# Patient Record
Sex: Male | Born: 1960
Health system: Southern US, Community
[De-identification: ages and names within clinical notes are randomized; demographics above are authoritative.]

## PROBLEM LIST (undated history)

## (undated) DIAGNOSIS — I10 Essential (primary) hypertension: Secondary | ICD-10-CM

## (undated) DIAGNOSIS — M1990 Unspecified osteoarthritis, unspecified site: Secondary | ICD-10-CM

## (undated) DIAGNOSIS — H269 Unspecified cataract: Secondary | ICD-10-CM

## (undated) DIAGNOSIS — M1460 Charcot's joint, unspecified site: Secondary | ICD-10-CM

## (undated) HISTORY — PX: HERNIA REPAIR: SHX51

## (undated) HISTORY — PX: CATARACT EXTRACTION: SUR2

## (undated) HISTORY — PX: KNEE SURGERY: SHX244

---

## 2013-11-24 ENCOUNTER — Emergency Department (HOSPITAL_COMMUNITY)
Admission: EM | Admit: 2013-11-24 | Discharge: 2013-11-24 | Disposition: A | Discharge status: Home / Self Care | Payer: Self-pay | Attending: Emergency Medicine | Admitting: Emergency Medicine

## 2013-11-24 ENCOUNTER — Encounter (HOSPITAL_COMMUNITY): Payer: Self-pay | Admitting: Emergency Medicine

## 2013-11-24 DIAGNOSIS — R609 Edema, unspecified: Secondary | ICD-10-CM | POA: Insufficient documentation

## 2013-11-24 DIAGNOSIS — M79609 Pain in unspecified limb: Secondary | ICD-10-CM

## 2013-11-24 DIAGNOSIS — M25462 Effusion, left knee: Secondary | ICD-10-CM

## 2013-11-24 DIAGNOSIS — M25569 Pain in unspecified knee: Secondary | ICD-10-CM | POA: Insufficient documentation

## 2013-11-24 DIAGNOSIS — M25562 Pain in left knee: Secondary | ICD-10-CM

## 2013-11-24 DIAGNOSIS — M25469 Effusion, unspecified knee: Secondary | ICD-10-CM | POA: Insufficient documentation

## 2013-11-24 MED ORDER — PREDNISONE 20 MG PO TABS: 40.0000 mg | ORAL_TABLET | Freq: Every day | ORAL | Status: DC

## 2013-11-24 MED ORDER — IBUPROFEN 600 MG PO TABS: 600.0000 mg | ORAL_TABLET | Freq: Four times a day (QID) | ORAL | Status: DC | PRN

## 2013-11-24 NOTE — ED Provider Notes (Signed)
CSN: [id number]     Arrival date & time 11/24/13  1648 History  This chart was scribed for Junius FinnerErin O'Malley, PA-C, non-physician practitioner working with Celene KrasJon R Knapp, MD by Nicholos Johnsenise Iheanachor, ED scribe. This patient was seen in room TR07C/TR07C and the patient's care was started at 6:47 PM.  Chief Complaint  Patient presents with  . Leg Pain    The history is provided by the patient. No language interpreter was used.  HPI Comments: Travis Mayo is a 53 y.o. male who presents to the Emergency Department complaining of left leg pain and stiffness, onset 5 weeks ago; associated swelling onset 2 days ago. Reports limited mobility of that left leg. Says it's a lot of pulling in the calf. States initial sx was a burning sensation in the back of the calf and knee when sx first onset. Right leg also swells intermittently but current complaint is all on the left. States knee pain and discomfort has not been of concern in the past, what is causing him concern today is pain in his shoulder and neck along with left knee pain that have not occurred in the past. Was seen at Urgent Care today for same sx and was told to come to the ER to rule out DVT. No hx of blood clot or injury related to current pain. Had gout several years ago in the big toe. Does not know of any heart problems that would contribute to leg swelling.   History reviewed. No pertinent past medical history. Past Surgical History  Procedure Laterality Date  . Hernia repair    . Knee surgery     History reviewed. No pertinent family history. History  Substance Use Topics  . Smoking status: Never Smoker   . Smokeless tobacco: Not on file  . Alcohol Use: Yes    Review of Systems  Musculoskeletal:       Left knee pain and swelling.  All other systems reviewed and are negative.   Allergies  Review of patient's allergies indicates no known allergies.  Home Medications   Current Outpatient Rx  Name  Route  Sig  Dispense  Refill  .  ibuprofen (ADVIL,MOTRIN) 600 MG tablet   Oral   Take 1 tablet (600 mg total) by mouth every 6 (six) hours as needed.   30 tablet   0   . predniSONE (DELTASONE) 20 MG tablet   Oral   Take 2 tablets (40 mg total) by mouth daily.   10 tablet   0     Triage Vitals: BP 154/69  Pulse 96  Temp(Src) 98.6 F (37 C) (Oral)  Resp 18  Ht 6' (1.829 m)  Wt 207 lb (93.895 kg)  BMI 28.07 kg/m2  SpO2 100%  Physical Exam  Nursing note and vitals reviewed. Constitutional: He is oriented to person, place, and time. He appears well-developed and well-nourished.  HENT:  Head: Normocephalic and atraumatic.  Eyes: EOM are normal.  Neck: Normal range of motion.  Cardiovascular: Normal rate.   Pulmonary/Chest: Effort normal.  Musculoskeletal: Normal range of motion. He exhibits edema.  LEFT KNEE: Mild to moderate edema in left knee. No erythema, warmth or ecchymosis. No joint line tenderness. No crepitus. No calf tenderness.  Neurological: He is alert and oriented to person, place, and time.  Skin: Skin is warm and dry.  Psychiatric: He has a normal mood and affect. His behavior is normal.   ED Course  Procedures (including critical care time) DIAGNOSTIC STUDIES: Oxygen Saturation is  100% on room air, normal by my interpretation.    COORDINATION OF CARE: At 6:52 PM: Discussed treatment plan with patient which includes a left knee sleeve and some medication to reduce inflammation. Patient agrees.   Labs Review Labs Reviewed - No data to display Imaging Review No results found.  EKG Interpretation   None       MDM   Final diagnoses:  Left knee pain  Swelling of left knee joint   Pt presenting with left knee pain and swelling, directed to ED by PCP to r/u DVT. Pt is low risk for DVT. No evidence of cellulitis or septic joint. No hx of injury.   Venous Duplex: no evidence of DVT or Baker's cyst. Will tx with knee sleeve, ibuprofen, and prednisone.  Advised to f/u with [employer] and PCP if not improving. Return precautions provided. Pt verbalized understanding and agreement with tx plan.   I personally performed the services described in this documentation, which was scribed in my presence. The recorded information has been reviewed and is accurate.      Junius Finner, PA-C 11/25/13 0200

## 2013-11-24 NOTE — ED Notes (Signed)
Pt reports for the past 5 weeks he has had pain in the left leg. Reports that he noticed swelling to the leg on Saturday. States he was sent here by PCP for r/o DVT. Pt reports pain and stiffness to the left leg. Denies any Chest pain or SOB.

## 2013-11-24 NOTE — ED Notes (Signed)
Spoke w/[facility]

## 2013-11-24 NOTE — ED Notes (Signed)
Venous duplex completed 

## 2013-11-24 NOTE — Progress Notes (Signed)
Left lower extremity venous duplex completed.  Left:  No evidence of DVT, superficial thrombosis, or Baker's cyst.  Right:  Negative for DVT in the common femoral vein.  

## 2013-11-24 NOTE — Discharge Instructions (Signed)
Arthralgia Arthralgia is joint pain. A joint is a place where two bones meet. Joint pain can happen for many reasons. The joint can be bruised, stiff, infected, or weak from aging. Pain usually goes away after resting and taking medicine for soreness.  HOME CARE  Rest the joint as told by your doctor.  Keep the sore joint raised (elevated) for the first 24 hours.  Put ice on the joint area.  Put ice in a plastic bag.  Place a towel between your skin and the bag.  Leave the ice on for 15-20 minutes, 03-04 times a day.  Wear your splint, casting, elastic bandage, or sling as told by your doctor.  Only take medicine as told by your doctor. Do not take aspirin.  Use crutches as told by your doctor. Do not put weight on the joint until told to by your doctor. GET HELP RIGHT AWAY IF:   You have bruising, puffiness (swelling), or more pain.  Your fingers or toes turn blue or start to lose feeling (numb).  Your medicine does not lessen the pain.  Your pain becomes severe.  You have a temperature by mouth above 102 F (38.9 C), not controlled by medicine.  You cannot move or use the joint. MAKE SURE YOU:   Understand these instructions.  Will watch your condition.  Will get help right away if you are not doing well or get worse. Document Released: 09/06/2009 Document Revised: 12/11/2011 Document Reviewed: 09/06/2009 [facility] Patient Information 2014 [place].  Heat Therapy Heat therapy can help make painful, stiff muscles and joints feel better. Do not use heat on new injuries. Wait at least 48 hours after an injury to use heat. Do not use heat when you have aches or pains right after an activity. If you still have pain 3 hours after stopping the activity, then you may use heat. HOME CARE Wet heat pack  Soak a clean towel in warm water. Squeeze out the extra water.  Put the warm, wet towel in a plastic bag.  Place a thin, dry towel between your skin and the  bag.  Put the heat pack on the area for 5 minutes, and check your skin. Your skin may be pink, but it should not be red.  Leave the heat pack on the area for 15 to 30 minutes.  Repeat this every 2 to 4 hours while awake. Do not use heat while you are sleeping. Warm water bath  Fill a tub with warm water.  Place the affected body part in the tub.  Soak the area for 20 to 40 minutes.  Repeat as needed. Hot water bottle  Fill the water bottle half full with hot water.  Press out the extra air. Close the cap tightly.  Place a dry towel between your skin and the bottle.  Put the bottle on the area for 5 minutes, and check your skin. Your skin may be pink, but it should not be red.  Leave the bottle on the area for 15 to 30 minutes.  Repeat this every 2 to 4 hours while awake. Electric heating pad  Place a dry towel between your skin and the heating pad.  Set the heating pad on low heat.  Put the heating pad on the area for 10 minutes, and check your skin. Your skin may be pink, but it should not be red.  Leave the heating pad on the area for 20 to 40 minutes.  Repeat this every 2  to 4 hours while awake.  Do not lie on the heating pad.  Do not fall asleep while using the heating pad.  Do not use the heating pad near water. GET HELP RIGHT AWAY IF:  You get blisters or red skin.  Your skin is puffy (swollen), or you lose feeling (numbness) in the affected area.  You have any new problems.  Your problems are getting worse.  You have any questions or concerns. If you have any problems, stop using heat therapy until you see your doctor. MAKE SURE YOU:  Understand these instructions.  Will watch your condition.  Will get help right away if you are not doing well or get worse. Document Released: 12/11/2011 Document Reviewed: 12/11/2011 [facility] Patient Information 2014 [place], [place].

## 2013-11-25 NOTE — ED Provider Notes (Signed)
Medical screening examination/treatment/procedure(s) were performed by non-physician practitioner and as supervising physician I was immediately available for consultation/collaboration.    Celene KrasJon R Voula Waln, MD 11/25/13 [id number]

## 2014-04-13 ENCOUNTER — Telehealth (HOSPITAL_COMMUNITY): Payer: Self-pay | Admitting: *Deleted

## 2014-05-18 ENCOUNTER — Telehealth (HOSPITAL_COMMUNITY): Payer: Self-pay | Admitting: *Deleted

## 2014-05-20 ENCOUNTER — Telehealth (HOSPITAL_COMMUNITY): Payer: Self-pay | Admitting: *Deleted

## 2014-06-01 ENCOUNTER — Telehealth (HOSPITAL_COMMUNITY): Payer: Self-pay | Admitting: *Deleted

## 2014-06-04 ENCOUNTER — Other Ambulatory Visit (HOSPITAL_COMMUNITY): Payer: Self-pay | Admitting: Family Medicine

## 2014-06-04 DIAGNOSIS — R5381 Other malaise: Secondary | ICD-10-CM

## 2014-06-04 DIAGNOSIS — R5383 Other fatigue: Principal | ICD-10-CM

## 2014-06-10 ENCOUNTER — Ambulatory Visit (HOSPITAL_COMMUNITY)
Admission: RE | Admit: 2014-06-10 | Discharge: 2014-06-10 | Disposition: A | Discharge status: Home / Self Care | Payer: Self-pay | Source: Ambulatory Visit | Attending: Cardiology | Admitting: Cardiology

## 2014-06-10 DIAGNOSIS — I517 Cardiomegaly: Secondary | ICD-10-CM

## 2014-06-10 DIAGNOSIS — R5381 Other malaise: Secondary | ICD-10-CM | POA: Insufficient documentation

## 2014-06-10 DIAGNOSIS — R609 Edema, unspecified: Secondary | ICD-10-CM | POA: Insufficient documentation

## 2014-06-10 DIAGNOSIS — R5383 Other fatigue: Principal | ICD-10-CM

## 2014-06-10 DIAGNOSIS — R7881 Bacteremia: Secondary | ICD-10-CM | POA: Insufficient documentation

## 2014-06-10 DIAGNOSIS — M79609 Pain in unspecified limb: Secondary | ICD-10-CM | POA: Insufficient documentation

## 2014-06-10 DIAGNOSIS — I080 Rheumatic disorders of both mitral and aortic valves: Secondary | ICD-10-CM | POA: Insufficient documentation

## 2014-06-10 NOTE — Progress Notes (Signed)
2D Echocardiogram Complete.  06/10/2014   Rylyn Ranganathan, RDCS  

## 2018-10-07 ENCOUNTER — Emergency Department (HOSPITAL_COMMUNITY)
Admission: EM | Admit: 2018-10-07 | Discharge: 2018-10-07 | Disposition: A | Discharge status: Home / Self Care | Payer: Self-pay | Attending: Emergency Medicine | Admitting: Emergency Medicine

## 2018-10-07 ENCOUNTER — Emergency Department (EMERGENCY_DEPARTMENT_HOSPITAL): Payer: Self-pay

## 2018-10-07 ENCOUNTER — Emergency Department (HOSPITAL_COMMUNITY): Payer: Self-pay

## 2018-10-07 ENCOUNTER — Encounter (HOSPITAL_COMMUNITY): Payer: Self-pay | Admitting: *Deleted

## 2018-10-07 DIAGNOSIS — L03116 Cellulitis of left lower limb: Secondary | ICD-10-CM | POA: Insufficient documentation

## 2018-10-07 DIAGNOSIS — R600 Localized edema: Secondary | ICD-10-CM

## 2018-10-07 DIAGNOSIS — M79662 Pain in left lower leg: Secondary | ICD-10-CM | POA: Insufficient documentation

## 2018-10-07 LAB — CBC WITH DIFFERENTIAL/PLATELET
Abs Immature Granulocytes: 0.09 10*3/uL — ABNORMAL HIGH (ref 0.00–0.07)
BASOS PCT: 1 %
Basophils Absolute: 0.1 10*3/uL (ref 0.0–0.1)
Eosinophils Absolute: 0 10*3/uL (ref 0.0–0.5)
Eosinophils Relative: 0 %
HEMATOCRIT: 45.6 % (ref 39.0–52.0)
Hemoglobin: 15 g/dL (ref 13.0–17.0)
IMMATURE GRANULOCYTES: 1 %
LYMPHS ABS: 1 10*3/uL (ref 0.7–4.0)
Lymphocytes Relative: 9 %
MCH: 30.1 pg (ref 26.0–34.0)
MCHC: 32.9 g/dL (ref 30.0–36.0)
MCV: 91.6 fL (ref 80.0–100.0)
MONO ABS: 0.8 10*3/uL (ref 0.1–1.0)
Monocytes Relative: 8 %
Neutro Abs: 8.5 10*3/uL — ABNORMAL HIGH (ref 1.7–7.7)
Neutrophils Relative %: 81 %
PLATELETS: 333 10*3/uL (ref 150–400)
RBC: 4.98 MIL/uL (ref 4.22–5.81)
RDW: 12.9 % (ref 11.5–15.5)
WBC: 10.4 10*3/uL (ref 4.0–10.5)
nRBC: 0 % (ref 0.0–0.2)

## 2018-10-07 LAB — COMPREHENSIVE METABOLIC PANEL
ALT: 38 U/L (ref 0–44)
AST: 30 U/L (ref 15–41)
Albumin: 4.1 g/dL (ref 3.5–5.0)
Alkaline Phosphatase: 57 U/L (ref 38–126)
Anion gap: 13 (ref 5–15)
BUN: 9 mg/dL (ref 6–20)
CO2: 23 mmol/L (ref 22–32)
CREATININE: 0.87 mg/dL (ref 0.61–1.24)
Calcium: 9.3 mg/dL (ref 8.9–10.3)
Chloride: 101 mmol/L (ref 98–111)
GFR calc non Af Amer: >60 mL/min (ref >60)
Glucose, Bld: 112 mg/dL — ABNORMAL HIGH (ref 70–99)
Potassium: 3.2 mmol/L — ABNORMAL LOW (ref 3.5–5.1)
SODIUM: 137 mmol/L (ref 135–145)
Total Bilirubin: 1.5 mg/dL — ABNORMAL HIGH (ref 0.3–1.2)
Total Protein: 7.4 g/dL (ref 6.5–8.1)

## 2018-10-07 LAB — I-STAT CG4 LACTIC ACID, ED: Lactic Acid, Venous: 1.47 mmol/L (ref 0.5–1.9)

## 2018-10-07 MED ORDER — LACTATED RINGERS IV BOLUS: 1000.0000 mL | Freq: Once | INTRAVENOUS | Status: DC

## 2018-10-07 MED ORDER — CLINDAMYCIN HCL 150 MG PO CAPS: 450.0000 mg | ORAL_CAPSULE | Freq: Three times a day (TID) | ORAL | 0 refills | Status: AC

## 2018-10-07 MED ORDER — ACETAMINOPHEN 500 MG PO TABS
1000.0000 mg | ORAL_TABLET | Freq: Once | ORAL | Status: DC
  Filled 2018-10-07: qty 2

## 2018-10-07 NOTE — ED Triage Notes (Signed)
Pt in c/o possible cellulitis to his left lower leg, had an injury three months ago and had a rock impale his left, he removed the rock and thought the area was healing fine, in the last few days he developed redness and swelling and increased pain to that area

## 2018-10-07 NOTE — ED Notes (Signed)
Pt verbalized understanding of discharge paperwork, prescriptions and follow-up care 

## 2018-10-07 NOTE — ED Provider Notes (Signed)
[facility] EMERGENCY DEPARTMENT Provider Note   CSN: [id number] Arrival date & time: 10/07/18  1240     History   Chief Complaint Chief Complaint  Patient presents with  . Cellulitis    HPI Travis Mayo is a 58 y.o. male.  HPI   Patient is a 58 year old male with a past medical history of EtOH abuse who presents for evaluation of left lower extremity pain, redness, and swelling.  Patient notes he had a hiking incident approximately 3 months ago during which he slipped and got some gravel embedded in his leg.  He states that the gravel was all removed and the wounds were washed out.  He states he subsequently has been doing fine but over the past week has developed the redness, swelling, and increased pain.  He denies any subsequent or interim traumatic injuries.  Denies any fevers, chills, headache, earache, sore throat, chest pain, cough, shortness of breath abdominal pain, nausea, vomiting, diarrhea, dysuria, pain redness or swelling in any other extremity, or other acute complaints.  Denies taking any analgesics prior today.  Endorses drinking 6-8 beers per day.  History reviewed. No pertinent past medical history.  There are no active problems to display for this patient.   Past Surgical History:  Procedure Laterality Date  . HERNIA REPAIR    . KNEE SURGERY          Home Medications    Prior to Admission medications   Medication Sig Start Date End Date Taking? Authorizing Provider  ibuprofen (ADVIL,MOTRIN) 200 MG tablet Take 800 mg by mouth every 6 (six) hours as needed (for inflammation).    Yes [provider]  clindamycin (CLEOCIN) 150 MG capsule Take 3 capsules (450 mg total) by mouth 3 (three) times daily for 7 days. 10/07/18 10/14/18  Antoine Primas, MD    Family History History reviewed. No pertinent family history.  Social History Social History   Tobacco Use  . Smoking status: Never Smoker  Substance Use Topics  . Alcohol  use: Yes  . Drug use: No     Allergies   Patient has no known allergies.   Review of Systems Review of Systems  Constitutional: Negative for chills and fever.  HENT: Negative for ear pain and sore throat.   Eyes: Negative for pain and visual disturbance.  Respiratory: Negative for cough and shortness of breath.   Cardiovascular: Negative for chest pain and palpitations.  Gastrointestinal: Negative for abdominal pain and vomiting.  Genitourinary: Negative for dysuria and hematuria.  Musculoskeletal: Positive for arthralgias ( L Ankle), joint swelling (L Ankle) and myalgias ( L  ankle). Negative for back pain.  Skin: Negative for color change and rash.  Neurological: Negative for seizures and syncope.  All other systems reviewed and are negative.    Physical Exam Updated Vital Signs BP (!) 171/106   Pulse 95   Temp 98.9 F (37.2 C) (Oral)   Resp 19   SpO2 97%   Physical Exam Vitals signs and nursing note reviewed.  Constitutional:      Appearance: Normal appearance. He is well-developed and normal weight.  HENT:     Head: Normocephalic and atraumatic.     Right Ear: External ear normal.     Left Ear: External ear normal.     Nose: Nose normal.     Mouth/Throat:     Mouth: Mucous membranes are moist.  Eyes:     Conjunctiva/sclera: Conjunctivae normal.  Neck:     Musculoskeletal:  Neck supple.  Cardiovascular:     Rate and Rhythm: Regular rhythm. Tachycardia present.     Heart sounds: No murmur.  Pulmonary:     Effort: Pulmonary effort is normal. No respiratory distress.     Breath sounds: Normal breath sounds.  Abdominal:     Palpations: Abdomen is soft.     Tenderness: There is no abdominal tenderness.  Musculoskeletal:     Left ankle: He exhibits decreased range of motion and swelling.     Comments: There is erythema on the posterior aspect of the left calf as well as increased erythema about the left ankle compared to the right.  Skin:    General: Skin is  warm and dry.     Capillary Refill: Capillary refill takes less than 2 seconds.  Neurological:     General: No focal deficit present.     Mental Status: He is alert.    Of note patient does not have significant pain on passive and active range of motion of the ankle.  ED Treatments / Results  Labs (all labs ordered are listed, but only abnormal results are displayed) Labs Reviewed  COMPREHENSIVE METABOLIC PANEL - Abnormal; Notable for the following components:      Result Value   Potassium 3.2 (*)    Glucose, Bld 112 (*)    Total Bilirubin 1.5 (*)    All other components within normal limits  CBC WITH DIFFERENTIAL/PLATELET - Abnormal; Notable for the following components:   Neutro Abs 8.5 (*)    Abs Immature Granulocytes 0.09 (*)    All other components within normal limits  URINALYSIS, ROUTINE W REFLEX MICROSCOPIC  I-STAT CG4 LACTIC ACID, ED    EKG EKG Interpretation  Date/Time:  Monday October 07 2018 18:19:12 EST Ventricular Rate:  100 PR Interval:    QRS Duration: 86 QT Interval:  346 QTC Calculation: 447 R Axis:   18 Text Interpretation:  Sinus tachycardia Abnormal R-wave progression, early transition no prior available for comparison Confirmed by Tilden Fossaees, Elizabeth [phone]([id number]) on 10/07/2018 6:23:05 PM   Radiology Dg Tibia/fibula Left  Result Date: 10/07/2018 CLINICAL DATA:  Recent soft tissue injury EXAM: LEFT TIBIA AND FIBULA - 2 VIEW COMPARISON:  None. FINDINGS: No acute bony abnormality is identified. Changes of prior trauma in the medial malleolus are seen. Soft tissue swelling is noted about the ankle and extending into the lower leg consistent with the given clinical history. IMPRESSION: Soft tissue swelling without acute bony abnormality. Electronically Signed   By: Alcide CleverMark  Lukens M.D.   On: 10/07/2018 14:36   [place] Lower Extremity Venous (dvt) (only [facility])  Result Date: 10/07/2018  Lower Venous Study Indications: Edema.  Performing Technologist: Toma DeitersVirginia Slaughter  RVS  Examination Guidelines: A complete evaluation includes B-mode imaging, spectral Doppler, color Doppler, and power Doppler as needed of all accessible portions of each vessel. Bilateral testing is considered an integral part of a complete examination. Limited examinations for reoccurring indications may be performed as noted.  Right Venous Findings: +---+---------------+---------+-----------+----------+-------+    CompressibilityPhasicitySpontaneityPropertiesSummary +---+---------------+---------+-----------+----------+-------+ CFVFull           Yes      Yes                          +---+---------------+---------+-----------+----------+-------+  Left Venous Findings: +---------+---------------+---------+-----------+----------+-------+          CompressibilityPhasicitySpontaneityPropertiesSummary +---------+---------------+---------+-----------+----------+-------+ CFV      Full  Yes      Yes                          +---------+---------------+---------+-----------+----------+-------+ SFJ      Full                                                 +---------+---------------+---------+-----------+----------+-------+ FV Prox  Full           Yes      Yes                          +---------+---------------+---------+-----------+----------+-------+ FV Mid   Full                                                 +---------+---------------+---------+-----------+----------+-------+ FV DistalFull           Yes      Yes                          +---------+---------------+---------+-----------+----------+-------+ PFV      Full           Yes      Yes                          +---------+---------------+---------+-----------+----------+-------+ POP      Full           Yes      Yes                          +---------+---------------+---------+-----------+----------+-------+ PTV      Full                                                  +---------+---------------+---------+-----------+----------+-------+ PERO     Full                                                 +---------+---------------+---------+-----------+----------+-------+    Summary: Right: No evidence of common femoral vein obstruction. Left: There is no evidence of deep vein thrombosis in the lower extremity. No cystic structure found in the popliteal fossa.  *See table(s) above for measurements and observations.    Preliminary     Procedures Procedures (including critical care time)  Medications Ordered in ED Medications  acetaminophen (TYLENOL) tablet 1,000 mg (1,000 mg Oral Refused 10/07/18 1820)  lactated ringers bolus 1,000 mL (1,000 mLs Intravenous Not Given 10/07/18 1821)     Initial Impression / Assessment and Plan / ED Course  I have reviewed the triage vital signs and the nursing notes.  Pertinent labs & imaging results that were available during my care of the patient were reviewed by me and considered in my medical decision making (see chart for details).     Patient is a 58 year old male who presents above-stated  history exam.  On presentation patient is noted to be tachycardic to 128, tachypneic to 22, hypertensive to 187/130, with otherwise stable vital signs.  Exam as above remarkable for edema, erythema, and in no significant pain on active and passive range of motion.  I have low suspicion for septic joint.  Ultrasound is not significant for DVT.  X-ray does not show any fractures or dislocations.  CBC shows WBC count of 10.4, hemoglobin 15, platelets 333.  CMP shows a K of 3.2 with otherwise no significant electrolyte metabolic abnormalities.  ECG shows a ventricular rate of 100 with normal intervals, normal axis, no signs of acute ischemic change.  Impression is cellulitis.  History exam is not consistent with acute traumatic injury, sepsis, or other imminent life-threatening etiology.  On reassessment patient's heart rate was noted to have  decreased to the 90s and he was no longer tachypneic.  He was counseled on the importance of decreasing his alcohol intake and close outpatient follow-up.  Patient discharged stable condition.  Strict precautions advised and discussed.  Instructed to follow-up with PCP in 3 to 5 days.  Prior to discharge patient voiced understanding and agreement with this plan.  Final Clinical Impressions(s) / ED Diagnoses   Final diagnoses:  Cellulitis of left lower extremity    ED Discharge Orders         Ordered    clindamycin (CLEOCIN) 150 MG capsule  3 times daily     10/07/18 1826           Antoine PrimasSmith, Laycie Schriner, MD 10/07/18 Claria [id number]    Tilden Fossaees, Elizabeth, MD 10/09/18 1158

## 2018-10-07 NOTE — Progress Notes (Signed)
Left lower extremity venous duplex completed - Preliminary results - There is no evidence of DVT or Baker's cyst. Toma Deiters, RVS 10/07/2018, 6:03 PM

## 2020-03-03 ENCOUNTER — Other Ambulatory Visit: Payer: Self-pay | Admitting: Internal Medicine

## 2020-03-03 DIAGNOSIS — M25572 Pain in left ankle and joints of left foot: Secondary | ICD-10-CM

## 2020-03-03 DIAGNOSIS — M25472 Effusion, left ankle: Secondary | ICD-10-CM

## 2020-03-07 ENCOUNTER — Ambulatory Visit
Admission: RE | Admit: 2020-03-07 | Discharge: 2020-03-07 | Disposition: A | Discharge status: Home / Self Care | Payer: Self-pay | Source: Ambulatory Visit | Attending: Internal Medicine | Admitting: Internal Medicine

## 2020-03-07 DIAGNOSIS — M25472 Effusion, left ankle: Secondary | ICD-10-CM

## 2020-03-07 DIAGNOSIS — M25572 Pain in left ankle and joints of left foot: Secondary | ICD-10-CM

## 2020-03-10 ENCOUNTER — Emergency Department (HOSPITAL_COMMUNITY)
Admission: EM | Admit: 2020-03-10 | Discharge: 2020-03-10 | Disposition: A | Discharge status: Home / Self Care | Payer: Self-pay | Attending: Emergency Medicine | Admitting: Emergency Medicine

## 2020-03-10 ENCOUNTER — Other Ambulatory Visit: Payer: Self-pay

## 2020-03-10 ENCOUNTER — Emergency Department (HOSPITAL_COMMUNITY): Payer: Self-pay

## 2020-03-10 DIAGNOSIS — M86672 Other chronic osteomyelitis, left ankle and foot: Secondary | ICD-10-CM | POA: Insufficient documentation

## 2020-03-10 DIAGNOSIS — Z79899 Other long term (current) drug therapy: Secondary | ICD-10-CM | POA: Insufficient documentation

## 2020-03-10 LAB — CBC WITH DIFFERENTIAL/PLATELET
Abs Immature Granulocytes: 0.08 10*3/uL — ABNORMAL HIGH (ref 0.00–0.07)
Basophils Absolute: 0.1 10*3/uL (ref 0.0–0.1)
Basophils Relative: 1 %
Eosinophils Absolute: 0.1 10*3/uL (ref 0.0–0.5)
Eosinophils Relative: 1 %
HCT: 39.1 % (ref 39.0–52.0)
Hemoglobin: 12.6 g/dL — ABNORMAL LOW (ref 13.0–17.0)
Immature Granulocytes: 1 %
Lymphocytes Relative: 13 %
Lymphs Abs: 1.5 10*3/uL (ref 0.7–4.0)
MCH: 31 pg (ref 26.0–34.0)
MCHC: 32.2 g/dL (ref 30.0–36.0)
MCV: 96.1 fL (ref 80.0–100.0)
Monocytes Absolute: 0.9 10*3/uL (ref 0.1–1.0)
Monocytes Relative: 8 %
Neutro Abs: 8.8 10*3/uL — ABNORMAL HIGH (ref 1.7–7.7)
Neutrophils Relative %: 76 %
Platelets: 366 10*3/uL (ref 150–400)
RBC: 4.07 MIL/uL — ABNORMAL LOW (ref 4.22–5.81)
RDW: 14.9 % (ref 11.5–15.5)
WBC: 11.4 10*3/uL — ABNORMAL HIGH (ref 4.0–10.5)
nRBC: 0 % (ref 0.0–0.2)

## 2020-03-10 LAB — URINALYSIS, ROUTINE W REFLEX MICROSCOPIC
Bilirubin Urine: NEGATIVE
Glucose, UA: NEGATIVE mg/dL
Hgb urine dipstick: NEGATIVE
Ketones, ur: NEGATIVE mg/dL
Leukocytes,Ua: NEGATIVE
Nitrite: NEGATIVE
Protein, ur: NEGATIVE mg/dL
Specific Gravity, Urine: 1.013 (ref 1.005–1.030)
pH: 5 (ref 5.0–8.0)

## 2020-03-10 LAB — C-REACTIVE PROTEIN: CRP: 1.2 mg/dL — ABNORMAL HIGH (ref <1.0)

## 2020-03-10 LAB — LACTIC ACID, PLASMA
Lactic Acid, Venous: 1 mmol/L (ref 0.5–1.9)
Lactic Acid, Venous: 0.9 mmol/L (ref 0.5–1.9)

## 2020-03-10 LAB — COMPREHENSIVE METABOLIC PANEL
ALT: 27 U/L (ref 0–44)
AST: 21 U/L (ref 15–41)
Albumin: 3.8 g/dL (ref 3.5–5.0)
Alkaline Phosphatase: 62 U/L (ref 38–126)
Anion gap: 16 — ABNORMAL HIGH (ref 5–15)
BUN: 14 mg/dL (ref 6–20)
CO2: 17 mmol/L — ABNORMAL LOW (ref 22–32)
Calcium: 9.5 mg/dL (ref 8.9–10.3)
Chloride: 108 mmol/L (ref 98–111)
Creatinine, Ser: 1.03 mg/dL (ref 0.61–1.24)
GFR calc Af Amer: >60 mL/min (ref >60)
GFR calc non Af Amer: >60 mL/min (ref >60)
Glucose, Bld: 96 mg/dL (ref 70–99)
Potassium: 3.5 mmol/L (ref 3.5–5.1)
Sodium: 141 mmol/L (ref 135–145)
Total Bilirubin: 1.1 mg/dL (ref 0.3–1.2)
Total Protein: 6.3 g/dL — ABNORMAL LOW (ref 6.5–8.1)

## 2020-03-10 LAB — SEDIMENTATION RATE: Sed Rate: 29 mm/hr — ABNORMAL HIGH (ref 0–16)

## 2020-03-10 MED ORDER — SODIUM CHLORIDE 0.9% FLUSH: 3.0000 mL | Freq: Once | INTRAVENOUS | Status: DC

## 2020-03-10 NOTE — ED Triage Notes (Signed)
Pt here for continued eval of L leg swelling for several weeks after injury 20 weeks ago. Had MRI Monday and was called and told to come to ED for tx of infection.

## 2020-03-10 NOTE — Consult Note (Signed)
Reason for Consult:  Left ankle swelling and progressive deformity Referring Physician: Marella Chimes, PA-C  Deshannon Hinchliffe is an 59 y.o. male.  HPI:  Mr. Newmark is a 59 y/o male with a PMH of rheumatoid arthritis.  He presents the ER with a cc of right ankle pain and swelling that has been bothering him off and on since October '19.  His pain and swelling has worsened over the last few months.  He has had episodes of f/c/n and poor appetite.  He injured his left ankle when he fell in a creek while trying to cross it.  When he pulled his leg out from the water a shard of sharp rock was lodged in his skin at his lateral ankle.  He treated this himself until a few months later when he began having swelling and pain.  His sister is a Marine scientist and recommended that he go to the ER while at his mother's funeral in January of 2020.  He was seen at the [facility] where xrays were obtained and showed no bony abnormality.  He was treated with oral antibiotics.  He had no further treatment until seeing Dr. Amil Amen a few weeks ago.  He has a h/o RA but has been in remission for the last several years.  He was worried that the ankle swelling was a flare of RA.  Dr. Amil Amen prescribed a course of oral prednisone, but this had no effect on his ankle symptoms.  He tried a short course of antibiotics but stopped these a few days ago.  He denies f/c/n/v/wt loss recently.  He is not diabetic.  No h/o smoking.  His ankle hurts worse when he's on it for any length of time.  He denies any h/o neuropathy.  PMH:  RA  Past Surgical History:  Procedure Laterality Date  . HERNIA REPAIR    . KNEE SURGERY      No family history on file.  Social History:  reports that he has never smoked. He does not have any smokeless tobacco history on file. He reports current alcohol use. He reports that he does not use drugs.  He works as a Tree surgeon at [employer]  Allergies: No Known Allergies  Medications: I have reviewed the patient's current  medications.  Results for orders placed or performed during the hospital encounter of 03/10/20 (from the past 48 hour(s))  Lactic acid, plasma     Status: None   Collection Time: 03/10/20  1:37 PM  Result Value Ref Range   Lactic Acid, Venous 1.0 0.5 - 1.9 mmol/L    Comment: Performed at [facility], [place] [address], [place], [place] [address]  Comprehensive metabolic panel     Status: Abnormal   Collection Time: 03/10/20  1:37 PM  Result Value Ref Range   Sodium 141 135 - 145 mmol/L   Potassium 3.5 3.5 - 5.1 mmol/L   Chloride 108 98 - 111 mmol/L   CO2 17 (L) 22 - 32 mmol/L   Glucose, Bld 96 70 - 99 mg/dL    Comment: Glucose reference range applies only to samples taken after fasting for at least 8 hours.   BUN 14 6 - 20 mg/dL   Creatinine, Ser 1.03 0.61 - 1.24 mg/dL   Calcium 9.5 8.9 - 10.3 mg/dL   Total Protein 6.3 (L) 6.5 - 8.1 g/dL   Albumin 3.8 3.5 - 5.0 g/dL   AST 21 15 - 41 U/L   ALT 27 0 -  44 U/L   Alkaline Phosphatase 62 38 - 126 U/L   Total Bilirubin 1.1 0.3 - 1.2 mg/dL   GFR calc non Af Amer >60 >60 mL/min   GFR calc Af Amer >60 >60 mL/min   Anion gap 16 (H) 5 - 15    Comment: Performed at [facility] [address], [place], [place] [address]  CBC with Differential     Status: Abnormal   Collection Time: 03/10/20  1:37 PM  Result Value Ref Range   WBC 11.4 (H) 4.0 - 10.5 K/uL   RBC 4.07 (L) 4.22 - 5.81 MIL/uL   Hemoglobin 12.6 (L) 13.0 - 17.0 g/dL   HCT 39.1 39.0 - 52.0 %   MCV 96.1 80.0 - 100.0 fL   MCH 31.0 26.0 - 34.0 pg   MCHC 32.2 30.0 - 36.0 g/dL   RDW 14.9 11.5 - 15.5 %   Platelets 366 150 - 400 K/uL   nRBC 0.0 0.0 - 0.2 %   Neutrophils Relative % 76 %   Neutro Abs 8.8 (H) 1.7 - 7.7 K/uL   Lymphocytes Relative 13 %   Lymphs Abs 1.5 0.7 - 4.0 K/uL   Monocytes Relative 8 %   Monocytes Absolute 0.9 0.1 - 1.0 K/uL   Eosinophils Relative 1 %   Eosinophils Absolute 0.1 0.0 - 0.5 K/uL   Basophils Relative 1 %   Basophils Absolute 0.1 0.0 -  0.1 K/uL   Immature Granulocytes 1 %   Abs Immature Granulocytes 0.08 (H) 0.00 - 0.07 K/uL    Comment: Performed at [facility] [address] [place], [place] [address]  Urinalysis, Routine w reflex microscopic     Status: None   Collection Time: 03/10/20  5:00 PM  Result Value Ref Range   Color, Urine YELLOW YELLOW   APPearance CLEAR CLEAR   Specific Gravity, Urine 1.013 1.005 - 1.030   pH 5.0 5.0 - 8.0   Glucose, UA NEGATIVE NEGATIVE mg/dL   Hgb urine dipstick NEGATIVE NEGATIVE   Bilirubin Urine NEGATIVE NEGATIVE   Ketones, ur NEGATIVE NEGATIVE mg/dL   Protein, ur NEGATIVE NEGATIVE mg/dL   Nitrite NEGATIVE NEGATIVE   Leukocytes,Ua NEGATIVE NEGATIVE    Comment: Performed at [facility] [address] [place], [place] [address]  Lactic acid, plasma     Status: None   Collection Time: 03/10/20  7:27 PM  Result Value Ref Range   Lactic Acid, Venous 0.9 0.5 - 1.9 mmol/L    Comment: Performed at [facility] [address] [place], [place] [address]  Sedimentation rate     Status: Abnormal   Collection Time: 03/10/20  7:27 PM  Result Value Ref Range   Sed Rate 29 (H) 0 - 16 mm/hr    Comment: Performed at [facility] [address], [place],  [address]  C-reactive protein     Status: Abnormal   Collection Time: 03/10/20  7:27 PM  Result Value Ref Range   CRP 1.2 (H) <1.0 mg/dL    Comment: Performed at [facility], [place] [address], [place],  [address]    DG Ankle Complete Left  Result Date: 03/10/2020 CLINICAL DATA:  Possible infection. EXAM: LEFT FOOT - COMPLETE 3+ VIEW; LEFT ANKLE COMPLETE - 3+ VIEW COMPARISON:  March 07, 2020 MRI. FINDINGS: Again noted are destructive changes of the distal tibia and fibula with extensive surrounding soft tissue swelling. Multiple small relatively well corticated osseous  fragments are noted about the distal fibula and medial malleolus. There is an abnormal appearance the mortise joint with a large  joint effusion. There is no definite acute displaced fracture. There is a small plantar calcaneal spur. Vascular calcifications are noted. IMPRESSION: Again noted are advanced destructive changes of the distal tibia and fibula concerning for osteomyelitis as seen on the patient's prior ankle MRI. Electronically Signed   By: Constance Holster M.D.   On: 03/10/2020 20:02   DG Foot Complete Left  Result Date: 03/10/2020 CLINICAL DATA:  Possible infection. EXAM: LEFT FOOT - COMPLETE 3+ VIEW; LEFT ANKLE COMPLETE - 3+ VIEW COMPARISON:  March 07, 2020 MRI. FINDINGS: Again noted are destructive changes of the distal tibia and fibula with extensive surrounding soft tissue swelling. Multiple small relatively well corticated osseous fragments are noted about the distal fibula and medial malleolus. There is an abnormal appearance the mortise joint with a large joint effusion. There is no definite acute displaced fracture. There is a small plantar calcaneal spur. Vascular calcifications are noted. IMPRESSION: Again noted are advanced destructive changes of the distal tibia and fibula concerning for osteomyelitis as seen on the patient's prior ankle MRI. Electronically Signed   By: Constance Holster M.D.   On: 03/10/2020 20:02    ROS:  No recent f/c/n/v/w/t loss.  10 system review is o/w negative. PE:  Blood pressure (!) 170/105, pulse (!) 112, temperature 97.9 F (36.6 C), temperature source Oral, resp. rate 18, SpO2 99 %. wn wd male in nad.  A and Ox 4.  Mood and affect normal.  EOMI.  resp unlabored.  L ankle with significant swelling.  Skin intact.  No cellulitis, but there is a large effusion.  Gross valgus malalignment through the ankle.  TTP at the ankle.  Intact sens to LT at the ankel and foot.  No lymphadenopathy or lymphangiitis.  5/5 strength in PF and DF of the ankle and toes.  Assessment/Plan:  Left ankle osteomyelitis involving the distal tibia, talus and calcaneus.  I explained the nature of the  findings on MRI, xray and lab values and explained the nature of osteomyelitis to the patient in detail.  His ESR, CRP and WBC are all elevated, and his MRI and xrays indicate a wide spread infection of the bones surrounding the ankle joint.  Penetrating trauma to the ankle in creek water is the likely source of chronic infection that has led to complete destruction of the joint and surrounding bones.  I explained surgical treatment of osteomyelitis as well.  At this point he would need debridement of the remaining distal tibia and fibula as well as the bulk of the talus and a significant portion of the calcaneus.  He would need a prolonged course of IV abx based on identification of an infecting organism.  If the infection could be successfully eradicated he would be left with a foot and leg without an ankle and a huge bone void that cannot likely be filled with sufficiently to allow WB with either a TTC arthodesis or joint replacement.  Either option would require substantial foreign material such as metallic hardware and/or allograft bone which both have substantial risk of infection.  As a result I believe this extensive infection will likely require below knee amputation for definitive treatment and to allow a return to weight bearing.  I believe he is safe to be discharged to home.  He will follow up with me in the next week in the office.  I  will try to arrange a second opinion visit with Dr. Sharol Given to see if he feels there are any other treatment options.  The patient knows to call if he develops any signs of worsening infection.  Hold abx for now pending possible open biopsy.  Wylene Simmer 03/10/2020, 10:03 PM

## 2020-03-10 NOTE — ED Notes (Signed)
The pt reports also that the ortho doctor is sending him home  No discharge papers yet

## 2020-03-10 NOTE — ED Notes (Signed)
The pt reports that he is  Ready to go home after waiting in the waiting room for 6 hours before he  Was brought back to a room

## 2020-03-10 NOTE — ED Provider Notes (Addendum)
[facility] EMERGENCY DEPARTMENT Provider Note   CSN: [id number] Arrival date & time: 03/10/20  1313     History Chief Complaint  Patient presents with   Leg Swelling    Travis Mayo is a 59 y.o. male.  Patient presents emergency department today for ongoing left ankle and foot swelling. Patient reports initial injury about 20 months ago. He states that he cut his left ankle on a rock after stepping off of a platform in a creek. After this point he had swelling and was treated for infection. He has had intermittent pain and swelling -- ED visit 10/2018 at which time he was treated with clindamycin.  He has recently been following up with a rheumatologist.  He had an MRI which showed advanced destructive change of the ankle with chronic osteomyelitis, possible abscess.  He was sent to the emergency department for evaluation after discussion with orthopedic physician.  Patient has pain with weightbearing.  He complains of swelling of the leg.  It has been worse than baseline over the past 10 weeks.  No current fevers, nausea or vomiting.  No history of diabetes.        No past medical history on file.  There are no problems to display for this patient.   Past Surgical History:  Procedure Laterality Date   HERNIA REPAIR     KNEE SURGERY         No family history on file.  Social History   Tobacco Use   Smoking status: Never Smoker  Substance Use Topics   Alcohol use: Yes   Drug use: No    Home Medications Prior to Admission medications   Medication Sig Start Date End Date Taking? Authorizing Provider  ibuprofen (ADVIL,MOTRIN) 200 MG tablet Take 800 mg by mouth every 6 (six) hours as needed (for inflammation).    Yes [provider]  lisinopril (ZESTRIL) 20 MG tablet Take 20 mg by mouth daily.   Yes [provider]    Allergies    Patient has no known allergies.  Review of Systems   Review of Systems  Constitutional:  Negative for chills and fever.  HENT: Negative for rhinorrhea and sore throat.   Eyes: Negative for redness.  Respiratory: Negative for cough.   Cardiovascular: Negative for chest pain.  Gastrointestinal: Negative for abdominal pain, diarrhea, nausea and vomiting.  Genitourinary: Negative for dysuria.  Musculoskeletal: Positive for arthralgias and joint swelling. Negative for myalgias.  Skin: Negative for color change and rash.  Neurological: Negative for headaches.    Physical Exam Updated Vital Signs BP (!) 162/103 (BP Location: Left Arm)    Pulse (!) 104    Temp 98.1 F (36.7 C) (Oral)    Resp 16    SpO2 100%   Physical Exam Vitals and nursing note reviewed.  Constitutional:      Appearance: He is well-developed.  HENT:     Head: Normocephalic and atraumatic.  Eyes:     General:        Right eye: No discharge.        Left eye: No discharge.     Conjunctiva/sclera: Conjunctivae normal.  Cardiovascular:     Rate and Rhythm: Normal rate and regular rhythm.     Heart sounds: Normal heart sounds.  Pulmonary:     [place]: Pulmonary effort is normal.     Breath sounds: Normal breath sounds.  Abdominal:     Palpations: Abdomen is soft.     Tenderness:  There is no abdominal tenderness.  Musculoskeletal:        General: Tenderness present.     Cervical back: Normal range of motion and neck supple.     Right lower leg: No edema.     Left lower leg: Edema present.     Left foot: Swelling and tenderness present.  Skin:    General: Skin is warm and dry.  Neurological:     Mental Status: He is alert.     ED Results / Procedures / Treatments   Labs (all labs ordered are listed, but only abnormal results are displayed) Labs Reviewed  COMPREHENSIVE METABOLIC PANEL - Abnormal; Notable for the following components:      Result Value   CO2 17 (*)    Total Protein 6.3 (*)    Anion gap 16 (*)    All other components within normal limits  CBC WITH DIFFERENTIAL/PLATELET -  Abnormal; Notable for the following components:   WBC 11.4 (*)    RBC 4.07 (*)    Hemoglobin 12.6 (*)    Neutro Abs 8.8 (*)    Abs Immature Granulocytes 0.08 (*)    All other components within normal limits  LACTIC ACID, PLASMA  URINALYSIS, ROUTINE W REFLEX MICROSCOPIC  LACTIC ACID, PLASMA  SEDIMENTATION RATE  C-REACTIVE PROTEIN    EKG None  Radiology DG Ankle Complete Left  Result Date: 03/10/2020 CLINICAL DATA:  Possible infection. EXAM: LEFT FOOT - COMPLETE 3+ VIEW; LEFT ANKLE COMPLETE - 3+ VIEW COMPARISON:  March 07, 2020 MRI. FINDINGS: Again noted are destructive changes of the distal tibia and fibula with extensive surrounding soft tissue swelling. Multiple small relatively well corticated osseous fragments are noted about the distal fibula and medial malleolus. There is an abnormal appearance the mortise joint with a large joint effusion. There is no definite acute displaced fracture. There is a small plantar calcaneal spur. Vascular calcifications are noted. IMPRESSION: Again noted are advanced destructive changes of the distal tibia and fibula concerning for osteomyelitis as seen on the patient's prior ankle MRI. Electronically Signed   By: Constance Holster M.D.   On: 03/10/2020 20:02   DG Foot Complete Left  Result Date: 03/10/2020 CLINICAL DATA:  Possible infection. EXAM: LEFT FOOT - COMPLETE 3+ VIEW; LEFT ANKLE COMPLETE - 3+ VIEW COMPARISON:  March 07, 2020 MRI. FINDINGS: Again noted are destructive changes of the distal tibia and fibula with extensive surrounding soft tissue swelling. Multiple small relatively well corticated osseous fragments are noted about the distal fibula and medial malleolus. There is an abnormal appearance the mortise joint with a large joint effusion. There is no definite acute displaced fracture. There is a small plantar calcaneal spur. Vascular calcifications are noted. IMPRESSION: Again noted are advanced destructive changes of the distal tibia and fibula  concerning for osteomyelitis as seen on the patient's prior ankle MRI. Electronically Signed   By: Constance Holster M.D.   On: 03/10/2020 20:02    Procedures Procedures (including critical care time)  Medications Ordered in ED Medications  sodium chloride flush (NS) 0.9 % injection 3 mL (has no administration in time range)    ED Course  I have reviewed the triage vital signs and the nursing notes.  Pertinent labs & imaging results that were available during my care of the patient were reviewed by me and considered in my medical decision making (see chart for details).  Patient seen and examined. Work-up reviewed.  Vital signs reviewed and are as follows: BP Marland Kitchen)  162/103 (BP Location: Left Arm)    Pulse (!) 104    Temp 98.1 F (36.7 C) (Oral)    Resp 16    SpO2 100%   Dr. Victorino Dike called me regarding patient -- requests x-rays and inflammatory markers be ordered.  He will see patient in the emergency department.  Patient updated on plan.  X-rays reviewed.  Dr. Victorino Dike has seen patient in the ED.  Patient will require close outpatient follow-up with his office.  Plan will be for discharge to home tonight.  No indications for additional courses of antibiotics at this point.  9:29 PM Checked in with Mr. Stiff prior to discharge. He is aware of follow-up plan.     MDM Rules/Calculators/A&P                      Patient with chronic osteomyelitis of left ankle and foot which has likely been ongoing for the better part of 2 years.  Patient has advanced bony destruction.  Patient will require surgical intervention, however he does not have active infection, fever, signs of sepsis which would require admission to the hospital tonight.  Orthopedic surgery has been involved in the patient's care in the emergency department.  With blood cell count is mildly elevated otherwise normal lactate, no fever.  No signs of acute cellulitis of the foot and ankle.   Final Clinical Impression(s) / ED  Diagnoses Final diagnoses:  Chronic osteomyelitis involving left ankle and foot [facility])    Rx / DC Orders ED Discharge Orders    None       Renne Crigler, PA-C 03/10/20 2027    Renne Crigler, PA-C 03/10/20 2129    Arby Barrette, MD 03/10/20 [id number]

## 2020-03-10 NOTE — Discharge Instructions (Signed)
Please read and follow all provided instructions.  Your diagnoses today include:  1. Chronic osteomyelitis involving left ankle and foot (HCC)     Tests performed today include:  Blood counts and electrolytes  X-rays of the foot and ankle  Vital signs. See below for your results today.   Medications prescribed:   None  Take any prescribed medications only as directed.  Home care instructions:  Follow any educational materials contained in this packet.  BE VERY CAREFUL not to take multiple medicines containing Tylenol (also called acetaminophen). Doing so can lead to an overdose which can damage your liver and cause liver failure and possibly death.   Follow-up instructions: Please follow-up with Dr. Laverta Baltimore office as instructed.   Return instructions:   Please return to the Emergency Department if you experience worsening symptoms.   Please return if you have any other emergent concerns.  Additional Information:  Your vital signs today were: BP (!) 170/105   Pulse (!) 112   Temp 98.1 F (36.7 C) (Oral)   Resp 18   SpO2 99%  If your blood pressure (BP) was elevated above 135/85 this visit, please have this repeated by your doctor within one month. --------------

## 2020-03-10 NOTE — ED Notes (Signed)
Ortho doctor at   The bedside

## 2020-03-11 ENCOUNTER — Encounter: Payer: Self-pay | Admitting: Orthopedic Surgery

## 2020-03-11 ENCOUNTER — Ambulatory Visit (INDEPENDENT_AMBULATORY_CARE_PROVIDER_SITE_OTHER): Payer: Self-pay | Admitting: Orthopedic Surgery

## 2020-03-11 ENCOUNTER — Telehealth: Payer: Self-pay

## 2020-03-11 ENCOUNTER — Telehealth: Payer: Self-pay | Admitting: Orthopedic Surgery

## 2020-03-11 VITALS — Ht 72.0 in | Wt 207.0 lb

## 2020-03-11 DIAGNOSIS — M14672 Charcot's joint, left ankle and foot: Secondary | ICD-10-CM

## 2020-03-11 NOTE — Telephone Encounter (Signed)
Called pt per Dr. Lajoyce Corners to work in for an appt oesto of tib lm on vm to advise that he should call back and ask for me so that I can work on sch for today. Will hold message pending return call.

## 2020-03-11 NOTE — Telephone Encounter (Signed)
Patient returned call to Autumn. Would like a call back.

## 2020-03-11 NOTE — Telephone Encounter (Signed)
Patient is scheduled to come in today at 330pm.

## 2020-03-11 NOTE — Telephone Encounter (Signed)
Patient worked in today to be seen.

## 2020-03-12 ENCOUNTER — Encounter: Payer: Self-pay | Admitting: Orthopedic Surgery

## 2020-03-12 NOTE — Progress Notes (Signed)
Office Visit Note   Patient: Travis Mayo           Date of Birth: August 14, 1961           MRN: [id number] Visit Date: 03/11/2020              Requested by: No referring provider defined for this encounter. PCP: System, Pcp Not In  Chief Complaint  Patient presents with  . Left Leg - Pain, New Patient (Initial Visit)  . Left Ankle - Pain      HPI: Patient is a 59 year old gentleman who was seen for initial evaluation for a complex medical history with MRI scan findings consistent with osteomyelitis of the tibia talus and calcaneus.  Patient states that he first injured his ankle in October 2019 when he was playing disc golf he crossed the [facility] and a rock broke the skin of the lateral malleolus approximately 6 cm proximal to the distal aspect of the lateral malleolus.  Patient states that he initially had no redness no cellulitis.  He states that in January 2020 he started developing some redness and swelling and was placed on antibiotics he states that for 6 months he was fine and then in June 2020 he started having swelling and a recent MRI scan shows bony changes of the distal tibia talus and calcaneus consistent with osteomyelitis.  Patient states he occasionally smokes cigars does not smoke cigarettes.  Height 6 foot 2 inches weight 215 pounds.  Patient denies history of diabetes or neuropathy.  Assessment & Plan: Visit Diagnoses:  1. Charcot's joint, left ankle and foot     Plan: Patient's presentation is not straightforward.  His plain films and MRI scan seem more consistent with Charcot arthropathy and the initial traumatic wound was 6 cm proximal to where the bony changes are.  Patient currently has no cellulitis no tenderness to palpation.  We will start him on a compression stocking and place him in a fracture boot and follow-up next week.  Follow-Up Instructions: Return in about 1 week (around 03/18/2020).   Ortho Exam  Patient is alert, oriented, no adenopathy,  well-dressed, normal affect, normal respiratory effort. Examination patient has a strong dorsalis pedis pulse he has significant swelling of the calf ankle and foot with pitting edema there is no tenderness to palpation there is no cellulitis.  Wear the rock penetrated the skin is approximately 6 cm proximal to the radiographic findings.  There are no open wounds the traumatic wound has healed well with no clinical signs of any underlying infection.  Review of the plain films shows Charcot collapse through the ankle joint.  Review of the MRI scan shows increased uptake in the tibia calcaneus and talus.  There is no increased uptake where the rock penetrated the skin over the fibula.  Imaging: No results found. No images are attached to the encounter.  Labs: Lab Results  Component Value Date   ESRSEDRATE 29 (H) 03/10/2020   CRP 1.2 (H) 03/10/2020     Lab Results  Component Value Date   ALBUMIN 3.8 03/10/2020   ALBUMIN 4.1 10/07/2018    No results found for: MG No results found for: VD25OH  No results found for: PREALBUMIN CBC EXTENDED Latest Ref Rng & Units 03/10/2020 10/07/2018  WBC 4.0 - 10.5 K/uL 11.4(H) 10.4  RBC 4.22 - 5.81 MIL/uL 4.07(L) 4.98  HGB 13.0 - 17.0 g/dL 12.6(L) 15.0  HCT 39 - 52 % 39.1 45.6  PLT 150 - 400 K/uL 366  333  NEUTROABS 1.7 - 7.7 K/uL 8.8(H) 8.5(H)  LYMPHSABS 0.7 - 4.0 K/uL 1.5 1.0     Body mass index is 28.07 kg/m.  Orders:  No orders of the defined types were placed in this encounter.  No orders of the defined types were placed in this encounter.    Procedures: No procedures performed  Clinical Data: No additional findings.  ROS:  All other systems negative, except as noted in the HPI. Review of Systems  Objective: Vital Signs: Ht 6' (1.829 m)   Wt 207 lb (93.9 kg)   BMI 28.07 kg/m   Specialty Comments:  No specialty comments available.  PMFS History: There are no problems to display for this patient.  No past medical  history on file.  No family history on file.  Past Surgical History:  Procedure Laterality Date  . HERNIA REPAIR    . KNEE SURGERY     Social History   Occupational History  . Not on file  Tobacco Use  . Smoking status: Never Smoker  . Smokeless tobacco: Never Used  Substance and Sexual Activity  . Alcohol use: Yes  . Drug use: No  . Sexual activity: Not on file

## 2020-03-18 ENCOUNTER — Other Ambulatory Visit: Payer: Self-pay

## 2020-03-18 ENCOUNTER — Ambulatory Visit (INDEPENDENT_AMBULATORY_CARE_PROVIDER_SITE_OTHER): Payer: Self-pay | Admitting: Orthopedic Surgery

## 2020-03-18 ENCOUNTER — Encounter: Payer: Self-pay | Admitting: Orthopedic Surgery

## 2020-03-18 VITALS — Ht 72.0 in | Wt 207.0 lb

## 2020-03-18 DIAGNOSIS — G8929 Other chronic pain: Secondary | ICD-10-CM

## 2020-03-18 DIAGNOSIS — M14672 Charcot's joint, left ankle and foot: Secondary | ICD-10-CM

## 2020-03-18 DIAGNOSIS — M25562 Pain in left knee: Secondary | ICD-10-CM

## 2020-03-18 NOTE — Progress Notes (Signed)
Office Visit Note   Patient: Travis Mayo           Date of Birth: March 07, 1961           MRN: [id number] Visit Date: 03/18/2020              Requested by: No referring provider defined for this encounter. PCP: System, Pcp Not In  Chief Complaint  Patient presents with  . Left Foot - Follow-up  . Left Ankle - Follow-up      HPI: Patient presents in follow-up for Charcot arthropathy with swelling of the left ankle and foot.  Patient states that since he started wearing the medical compression stocking he has had noticed a decrease swelling he denies any pain he is quite pleased with the rapid improvement in his swelling.  Patient states he feels uncomfortable trying to weight-bear due to the instability of the ankle feeling like it is a bag of bones.  Assessment & Plan: Visit Diagnoses:  1. Chronic pain of left knee   2. Charcot's joint, left ankle and foot     Plan: With the bony prominence medially and pending skin breakdown have discussed that since the swelling has decreased we should proceed with tibial calcaneal fusion.  This should provide him a stable hindfoot for weightbearing.  Risks and benefits of surgery were discussed including infection nonhealing incision nonhealing the bone need for additional surgery.  Patient states he understands wished to proceed at this time.  Follow-Up Instructions: Return in about 2 weeks (around 04/01/2020).   Ortho Exam  Patient is alert, oriented, no adenopathy, well-dressed, normal affect, normal respiratory effort.  patient has a strong dorsalis pedis pulse has a deformity of the hindfoot on with the prominence of the medial malleolus with pending skin breakdown.  Patient initially had a traumatic wound over the anterior compartment which appears to be the exact location of the superficial peroneal nerve exiting the anterior compartment.  Patient subjectively does not have sensation over the dorsum of his foot consistent with a  superficial peroneal nerve injury.  This may be the source of the neuropathy that has caused the Charcot collapse.  Patient has no plantar ulcers.  There is no redness no tenderness to palpation no draining ulcers.  Patient has a mildly elevated sed rate and CRP. Imaging: No results found. No images are attached to the encounter.  Labs: Lab Results  Component Value Date   ESRSEDRATE 29 (H) 03/10/2020   CRP 1.2 (H) 03/10/2020     Lab Results  Component Value Date   ALBUMIN 3.8 03/10/2020   ALBUMIN 4.1 10/07/2018    No results found for: MG No results found for: VD25OH  No results found for: PREALBUMIN CBC EXTENDED Latest Ref Rng & Units 03/10/2020 10/07/2018  WBC 4.0 - 10.5 K/uL 11.4(H) 10.4  RBC 4.22 - 5.81 MIL/uL 4.07(L) 4.98  HGB 13.0 - 17.0 g/dL 12.6(L) 15.0  HCT 39 - 52 % 39.1 45.6  PLT 150 - 400 K/uL 366 333  NEUTROABS 1.7 - 7.7 K/uL 8.8(H) 8.5(H)  LYMPHSABS 0.7 - 4.0 K/uL 1.5 1.0     Body mass index is 28.07 kg/m.  Orders:  No orders of the defined types were placed in this encounter.  No orders of the defined types were placed in this encounter.    Procedures: No procedures performed  Clinical Data: No additional findings.  ROS:  All other systems negative, except as noted in the HPI. Review of Systems  Objective:  Vital Signs: Ht 6' (1.829 m)   Wt 207 lb (93.9 kg)   BMI 28.07 kg/m   Specialty Comments:  No specialty comments available.  PMFS History: There are no problems to display for this patient.  History reviewed. No pertinent past medical history.  History reviewed. No pertinent family history.  Past Surgical History:  Procedure Laterality Date  . HERNIA REPAIR    . KNEE SURGERY     Social History   Occupational History  . Not on file  Tobacco Use  . Smoking status: Never Smoker  . Smokeless tobacco: Never Used  Substance and Sexual Activity  . Alcohol use: Yes  . Drug use: No  . Sexual activity: Not on file

## 2020-03-18 NOTE — Progress Notes (Signed)
   Office Visit Note   Patient: Travis Mayo           Date of Birth: [birth year]           MRN: [id number] Visit Date: 03/18/2020              Requested by: No referring provider defined for this encounter. PCP: System, Pcp Not In  Chief Complaint  Patient presents with  . Left Foot - Follow-up  . Left Ankle - Follow-up      HPI:    Assessment & Plan: Visit Diagnoses: No diagnosis found.    Follow-Up Instructions: No follow-ups on file.   Ortho Exam  Patient is alert, oriented, no adenopathy, well-dressed, normal affect, normal respiratory effort.   Imaging: No results found. No images are attached to the encounter.  Labs: Lab Results  Component Value Date   ESRSEDRATE 29 (H) 03/10/2020   CRP 1.2 (H) 03/10/2020     Lab Results  Component Value Date   ALBUMIN 3.8 03/10/2020   ALBUMIN 4.1 10/07/2018    No results found for: MG No results found for: VD25OH  No results found for: PREALBUMIN CBC EXTENDED Latest Ref Rng & Units 03/10/2020 10/07/2018  WBC 4.0 - 10.5 K/uL 11.4(H) 10.4  RBC 4.22 - 5.81 MIL/uL 4.07(L) 4.98  HGB 13.0 - 17.0 g/dL 12.6(L) 15.0  HCT 39 - 52 % 39.1 45.6  PLT 150 - 400 K/uL 366 333  NEUTROABS 1.7 - 7.7 K/uL 8.8(H) 8.5(H)  LYMPHSABS 0.7 - 4.0 K/uL 1.5 1.0     Body mass index is 28.07 kg/m.  Orders:  No orders of the defined types were placed in this encounter.  No orders of the defined types were placed in this encounter.    Procedures: No procedures performed  Clinical Data:   ROS:  All other systems negative, except as noted in the HPI. Review of Systems  Objective: Vital Signs: Ht 6' (1.829 m)   Wt 207 lb (93.9 kg)   BMI 28.07 kg/m   Specialty Comments:  No specialty comments available.  PMFS History: There are no problems to display for this patient.  No past medical history on file.  No family history on file.  Past Surgical History:  Procedure Laterality Date  . HERNIA REPAIR    . KNEE SURGERY      Social History   Occupational History  . Not on file  Tobacco Use  . Smoking status: Never Smoker  . Smokeless tobacco: Never Used  Substance and Sexual Activity  . Alcohol use: Yes  . Drug use: No  . Sexual activity: Not on file

## 2020-03-19 ENCOUNTER — Encounter: Payer: Self-pay | Admitting: Orthopedic Surgery

## 2020-03-22 NOTE — Telephone Encounter (Signed)
I believe this is for you.

## 2020-03-23 ENCOUNTER — Other Ambulatory Visit (HOSPITAL_COMMUNITY): Payer: Self-pay

## 2020-03-24 ENCOUNTER — Other Ambulatory Visit: Payer: Self-pay

## 2020-03-24 ENCOUNTER — Encounter (HOSPITAL_COMMUNITY): Payer: Self-pay | Admitting: Orthopedic Surgery

## 2020-03-24 ENCOUNTER — Other Ambulatory Visit (HOSPITAL_COMMUNITY)
Admission: RE | Admit: 2020-03-24 | Discharge: 2020-03-24 | Disposition: A | Discharge status: Home / Self Care | Payer: HRSA Program | Source: Ambulatory Visit | Attending: Orthopedic Surgery | Admitting: Orthopedic Surgery

## 2020-03-24 DIAGNOSIS — Z20822 Contact with and (suspected) exposure to covid-19: Secondary | ICD-10-CM | POA: Diagnosis not present

## 2020-03-24 DIAGNOSIS — Z01812 Encounter for preprocedural laboratory examination: Secondary | ICD-10-CM | POA: Insufficient documentation

## 2020-03-24 LAB — SARS CORONAVIRUS 2 (TAT 6-24 HRS): SARS Coronavirus 2: NEGATIVE

## 2020-03-24 NOTE — Progress Notes (Signed)
Pt denies SOB, chest pain, and being under the care of a cardiologist. Pt stated that PCP is Newton Pigg. Pt denies having a stress test and cardiac cath. Pt denies having a chest x ray but stated that " several EKG's " were performed at [facility].  Nurse requested LOV note, EKG tracings and any cardiac studies; awaiting response. Pt made aware to stop taking  Aspirin (unless otherwise advised by surgeon), vitamins, fish oil and herbal medications. Do not take any NSAIDs ie: Ibuprofen, Advil, Naproxen (Aleve), Motrin, BC and Goody Powder. Pt reminded to quarantine. Pt verbalized understanding of all pre-op instructions.

## 2020-03-25 ENCOUNTER — Other Ambulatory Visit: Payer: Self-pay | Admitting: Physician Assistant

## 2020-03-25 NOTE — Anesthesia Preprocedure Evaluation (Addendum)
Anesthesia Evaluation  Patient identified by MRN, date of birth, ID band Patient awake    Reviewed: Allergy & Precautions, NPO status , Patient's Chart, lab work & pertinent test results  History of Anesthesia Complications Negative for: history of anesthetic complications  Airway Mallampati: II  TM Distance: >3 FB Neck ROM: Full    Dental  (+) Missing,    Pulmonary Current Smoker and Patient abstained from smoking.,    Pulmonary exam normal        Cardiovascular hypertension, Pt. on medications Normal cardiovascular exam     Neuro/Psych negative neurological ROS  negative psych ROS   GI/Hepatic negative GI ROS, Neg liver ROS,   Endo/Other  negative endocrine ROS  Renal/GU negative Renal ROS  negative genitourinary   Musculoskeletal  (+) Arthritis , Charcot Left Ankle   Abdominal   Peds  Hematology negative hematology ROS (+)   Anesthesia Other Findings Day of surgery medications reviewed with patient.  Reproductive/Obstetrics negative OB ROS                            Anesthesia Physical Anesthesia Plan  ASA: II  Anesthesia Plan: General   Post-op Pain Management: GA combined w/ Regional for post-op pain   Induction: Intravenous  PONV Risk Score and Plan: 2 and Treatment may vary due to age or medical condition, Ondansetron, Dexamethasone and Midazolam  Airway Management Planned: LMA  Additional Equipment: None  Intra-op Plan:   Post-operative Plan: Extubation in OR  Informed Consent: I have reviewed the patients History and Physical, chart, labs and discussed the procedure including the risks, benefits and alternatives for the proposed anesthesia with the patient or authorized representative who has indicated his/her understanding and acceptance.     Dental advisory given  Plan Discussed with: CRNA  Anesthesia Plan Comments:        Anesthesia Quick  Evaluation

## 2020-03-26 ENCOUNTER — Other Ambulatory Visit: Payer: Self-pay

## 2020-03-26 ENCOUNTER — Ambulatory Visit (HOSPITAL_COMMUNITY): Payer: Self-pay | Admitting: Anesthesiology

## 2020-03-26 ENCOUNTER — Encounter (HOSPITAL_COMMUNITY)
Admission: RE | Disposition: A | Discharge status: Home / Self Care | Payer: Self-pay | Source: Home / Self Care | Attending: Orthopedic Surgery

## 2020-03-26 ENCOUNTER — Ambulatory Visit (HOSPITAL_COMMUNITY): Payer: Self-pay

## 2020-03-26 ENCOUNTER — Encounter (HOSPITAL_COMMUNITY): Payer: Self-pay | Admitting: Orthopedic Surgery

## 2020-03-26 ENCOUNTER — Observation Stay (HOSPITAL_COMMUNITY)
Admission: RE | Admit: 2020-03-26 | Discharge: 2020-03-26 | Disposition: A | Discharge status: Home / Self Care | Payer: Self-pay | Attending: Orthopedic Surgery | Admitting: Orthopedic Surgery

## 2020-03-26 DIAGNOSIS — Z79899 Other long term (current) drug therapy: Secondary | ICD-10-CM | POA: Insufficient documentation

## 2020-03-26 DIAGNOSIS — M1991 Primary osteoarthritis, unspecified site: Secondary | ICD-10-CM | POA: Insufficient documentation

## 2020-03-26 DIAGNOSIS — I10 Essential (primary) hypertension: Secondary | ICD-10-CM | POA: Insufficient documentation

## 2020-03-26 DIAGNOSIS — F17290 Nicotine dependence, other tobacco product, uncomplicated: Secondary | ICD-10-CM | POA: Insufficient documentation

## 2020-03-26 DIAGNOSIS — G8929 Other chronic pain: Secondary | ICD-10-CM | POA: Insufficient documentation

## 2020-03-26 DIAGNOSIS — M14672 Charcot's joint, left ankle and foot: Secondary | ICD-10-CM

## 2020-03-26 DIAGNOSIS — A5216 Charcot's arthropathy (tabetic): Principal | ICD-10-CM | POA: Insufficient documentation

## 2020-03-26 DIAGNOSIS — M25572 Pain in left ankle and joints of left foot: Secondary | ICD-10-CM | POA: Diagnosis present

## 2020-03-26 HISTORY — DX: Essential (primary) hypertension: I10

## 2020-03-26 HISTORY — DX: Charcot's joint, unspecified site: M14.60

## 2020-03-26 HISTORY — DX: Unspecified cataract: H26.9

## 2020-03-26 HISTORY — DX: Unspecified osteoarthritis, unspecified site: M19.90

## 2020-03-26 HISTORY — PX: ANKLE FUSION: SHX5718

## 2020-03-26 LAB — CBC
HCT: 39.1 % (ref 39.0–52.0)
Hemoglobin: 12.7 g/dL — ABNORMAL LOW (ref 13.0–17.0)
MCH: 30.2 pg (ref 26.0–34.0)
MCHC: 32.5 g/dL (ref 30.0–36.0)
MCV: 93.1 fL (ref 80.0–100.0)
Platelets: 458 10*3/uL — ABNORMAL HIGH (ref 150–400)
RBC: 4.2 MIL/uL — ABNORMAL LOW (ref 4.22–5.81)
RDW: 13.8 % (ref 11.5–15.5)
WBC: 10.4 10*3/uL (ref 4.0–10.5)
nRBC: 0 % (ref 0.0–0.2)

## 2020-03-26 LAB — BASIC METABOLIC PANEL
Anion gap: 13 (ref 5–15)
BUN: 9 mg/dL (ref 6–20)
CO2: 22 mmol/L (ref 22–32)
Calcium: 9.4 mg/dL (ref 8.9–10.3)
Chloride: 105 mmol/L (ref 98–111)
Creatinine, Ser: 1.09 mg/dL (ref 0.61–1.24)
GFR calc Af Amer: >60 mL/min (ref >60)
GFR calc non Af Amer: >60 mL/min (ref >60)
Glucose, Bld: 95 mg/dL (ref 70–99)
Potassium: 3.2 mmol/L — ABNORMAL LOW (ref 3.5–5.1)
Sodium: 140 mmol/L (ref 135–145)

## 2020-03-26 SURGERY — ANKLE FUSION
Anesthesia: General | Site: Ankle | Laterality: Left

## 2020-03-26 MED ORDER — ONDANSETRON HCL 4 MG PO TABS: 4.0000 mg | ORAL_TABLET | Freq: Four times a day (QID) | ORAL | Status: DC | PRN

## 2020-03-26 MED ORDER — EPHEDRINE 5 MG/ML INJ
INTRAVENOUS | Status: AC
  Filled 2020-03-26: qty 10

## 2020-03-26 MED ORDER — CEFAZOLIN SODIUM-DEXTROSE 2-4 GM/100ML-% IV SOLN
2.0000 g | INTRAVENOUS | Status: AC
  Administered 2020-03-26: 2 g via INTRAVENOUS
  Filled 2020-03-26: qty 100

## 2020-03-26 MED ORDER — ONDANSETRON HCL 4 MG/2ML IJ SOLN
INTRAMUSCULAR | Status: AC
  Filled 2020-03-26: qty 2

## 2020-03-26 MED ORDER — MIDAZOLAM HCL 2 MG/2ML IJ SOLN
INTRAMUSCULAR | Status: AC
  Filled 2020-03-26: qty 2

## 2020-03-26 MED ORDER — ONDANSETRON HCL 4 MG/2ML IJ SOLN
INTRAMUSCULAR | Status: DC | PRN
  Administered 2020-03-26: 4 mg via INTRAVENOUS

## 2020-03-26 MED ORDER — LACTATED RINGERS IV SOLN: INTRAVENOUS | Status: DC

## 2020-03-26 MED ORDER — FENTANYL CITRATE (PF) 250 MCG/5ML IJ SOLN
INTRAMUSCULAR | Status: AC
  Filled 2020-03-26: qty 5

## 2020-03-26 MED ORDER — METOCLOPRAMIDE HCL 5 MG/ML IJ SOLN: 5.0000 mg | Freq: Three times a day (TID) | INTRAMUSCULAR | Status: DC | PRN

## 2020-03-26 MED ORDER — LIDOCAINE 2% (20 MG/ML) 5 ML SYRINGE
INTRAMUSCULAR | Status: AC
  Filled 2020-03-26: qty 5

## 2020-03-26 MED ORDER — CHLORHEXIDINE GLUCONATE 0.12 % MT SOLN
15.0000 mL | Freq: Once | OROMUCOSAL | Status: AC
  Administered 2020-03-26: 15 mL via OROMUCOSAL
  Filled 2020-03-26: qty 15

## 2020-03-26 MED ORDER — PROPOFOL 10 MG/ML IV BOLUS
INTRAVENOUS | Status: AC
  Filled 2020-03-26: qty 40

## 2020-03-26 MED ORDER — OXYCODONE-ACETAMINOPHEN 5-325 MG PO TABS: 1.0000 | ORAL_TABLET | ORAL | 0 refills | Status: DC | PRN

## 2020-03-26 MED ORDER — PHENYLEPHRINE 40 MCG/ML (10ML) SYRINGE FOR IV PUSH (FOR BLOOD PRESSURE SUPPORT)
PREFILLED_SYRINGE | INTRAVENOUS | Status: DC | PRN
  Administered 2020-03-26: 40 ug via INTRAVENOUS
  Administered 2020-03-26 (×9): 80 ug via INTRAVENOUS
  Administered 2020-03-26: 120 ug via INTRAVENOUS
  Administered 2020-03-26: 80 ug via INTRAVENOUS
  Administered 2020-03-26: 120 ug via INTRAVENOUS

## 2020-03-26 MED ORDER — OXYCODONE HCL 5 MG/5ML PO SOLN: 5.0000 mg | Freq: Once | ORAL | Status: DC | PRN

## 2020-03-26 MED ORDER — SODIUM CHLORIDE 0.9 % IV SOLN: INTRAVENOUS | Status: DC

## 2020-03-26 MED ORDER — LIDOCAINE 2% (20 MG/ML) 5 ML SYRINGE
INTRAMUSCULAR | Status: DC | PRN
  Administered 2020-03-26: 50 mg via INTRAVENOUS

## 2020-03-26 MED ORDER — CEFAZOLIN SODIUM-DEXTROSE 2-4 GM/100ML-% IV SOLN
2.0000 g | Freq: Four times a day (QID) | INTRAVENOUS | Status: DC
  Administered 2020-03-26: 2 g via INTRAVENOUS
  Filled 2020-03-26: qty 100

## 2020-03-26 MED ORDER — FENTANYL CITRATE (PF) 100 MCG/2ML IJ SOLN
INTRAMUSCULAR | Status: DC | PRN
  Administered 2020-03-26 (×4): 50 ug via INTRAVENOUS

## 2020-03-26 MED ORDER — PHENYLEPHRINE 40 MCG/ML (10ML) SYRINGE FOR IV PUSH (FOR BLOOD PRESSURE SUPPORT)
PREFILLED_SYRINGE | INTRAVENOUS | Status: AC
  Filled 2020-03-26: qty 10

## 2020-03-26 MED ORDER — EPHEDRINE SULFATE-NACL 50-0.9 MG/10ML-% IV SOSY
PREFILLED_SYRINGE | INTRAVENOUS | Status: DC | PRN
  Administered 2020-03-26 (×6): 5 mg via INTRAVENOUS
  Administered 2020-03-26: 10 mg via INTRAVENOUS

## 2020-03-26 MED ORDER — ORAL CARE MOUTH RINSE: 15.0000 mL | Freq: Once | OROMUCOSAL | Status: AC

## 2020-03-26 MED ORDER — ACETAMINOPHEN 500 MG PO TABS
1000.0000 mg | ORAL_TABLET | Freq: Once | ORAL | Status: AC
  Administered 2020-03-26: 1000 mg via ORAL
  Filled 2020-03-26: qty 2

## 2020-03-26 MED ORDER — DEXAMETHASONE SODIUM PHOSPHATE 10 MG/ML IJ SOLN
INTRAMUSCULAR | Status: AC
  Filled 2020-03-26: qty 1

## 2020-03-26 MED ORDER — BUPIVACAINE-EPINEPHRINE (PF) 0.5% -1:200000 IJ SOLN
INTRAMUSCULAR | Status: DC | PRN
Start: 2020-03-26 — End: 2020-03-26
  Administered 2020-03-26: 20 mL via PERINEURAL
  Administered 2020-03-26: 10 mL via PERINEURAL

## 2020-03-26 MED ORDER — OXYCODONE HCL 5 MG PO TABS: 5.0000 mg | ORAL_TABLET | Freq: Once | ORAL | Status: DC | PRN

## 2020-03-26 MED ORDER — CLONIDINE HCL (ANALGESIA) 100 MCG/ML EP SOLN
EPIDURAL | Status: DC | PRN
Start: 2020-03-26 — End: 2020-03-26
  Administered 2020-03-26: 33 ug
  Administered 2020-03-26: 67 ug

## 2020-03-26 MED ORDER — OXYCODONE HCL 5 MG PO TABS: 5.0000 mg | ORAL_TABLET | ORAL | 0 refills | Status: DC | PRN

## 2020-03-26 MED ORDER — FENTANYL CITRATE (PF) 100 MCG/2ML IJ SOLN: 25.0000 ug | INTRAMUSCULAR | Status: DC | PRN

## 2020-03-26 MED ORDER — METOCLOPRAMIDE HCL 5 MG PO TABS: 5.0000 mg | ORAL_TABLET | Freq: Three times a day (TID) | ORAL | Status: DC | PRN

## 2020-03-26 MED ORDER — DOCUSATE SODIUM 100 MG PO CAPS: 100.0000 mg | ORAL_CAPSULE | Freq: Two times a day (BID) | ORAL | Status: DC

## 2020-03-26 MED ORDER — ONDANSETRON HCL 4 MG/2ML IJ SOLN: 4.0000 mg | Freq: Four times a day (QID) | INTRAMUSCULAR | Status: DC | PRN

## 2020-03-26 MED ORDER — MIDAZOLAM HCL 5 MG/5ML IJ SOLN
INTRAMUSCULAR | Status: DC | PRN
  Administered 2020-03-26: 2 mg via INTRAVENOUS

## 2020-03-26 MED ORDER — ASPIRIN 325 MG PO TABS: 325.0000 mg | ORAL_TABLET | Freq: Every day | ORAL | Status: DC

## 2020-03-26 MED ORDER — METHOCARBAMOL 500 MG PO TABS: 500.0000 mg | ORAL_TABLET | Freq: Four times a day (QID) | ORAL | Status: DC | PRN

## 2020-03-26 MED ORDER — HYDROMORPHONE HCL 1 MG/ML IJ SOLN: 0.5000 mg | INTRAMUSCULAR | Status: DC | PRN

## 2020-03-26 MED ORDER — PROPOFOL 10 MG/ML IV BOLUS
INTRAVENOUS | Status: DC | PRN
  Administered 2020-03-26: 200 mg via INTRAVENOUS
  Administered 2020-03-26: 100 mg via INTRAVENOUS

## 2020-03-26 MED ORDER — LISINOPRIL 20 MG PO TABS: 20.0000 mg | ORAL_TABLET | Freq: Every day | ORAL | Status: DC

## 2020-03-26 MED ORDER — ACETAMINOPHEN 325 MG PO TABS: 325.0000 mg | ORAL_TABLET | Freq: Four times a day (QID) | ORAL | Status: DC | PRN

## 2020-03-26 MED ORDER — OXYCODONE HCL 5 MG PO TABS: 5.0000 mg | ORAL_TABLET | ORAL | Status: DC | PRN

## 2020-03-26 MED ORDER — DEXAMETHASONE SODIUM PHOSPHATE 10 MG/ML IJ SOLN
INTRAMUSCULAR | Status: DC | PRN
  Administered 2020-03-26: 10 mg via INTRAVENOUS

## 2020-03-26 MED ORDER — PROMETHAZINE HCL 25 MG/ML IJ SOLN: 6.2500 mg | INTRAMUSCULAR | Status: DC | PRN

## 2020-03-26 MED ORDER — METHOCARBAMOL 1000 MG/10ML IJ SOLN
500.0000 mg | Freq: Four times a day (QID) | INTRAVENOUS | Status: DC | PRN
  Filled 2020-03-26: qty 5

## 2020-03-26 MED ORDER — SODIUM CHLORIDE 0.9 % IR SOLN
Status: DC | PRN
  Administered 2020-03-26 (×2): 1000 mL

## 2020-03-26 MED FILL — OXYCODONE-APAP 5-325MG: 5-325 | 5 days supply | Qty: 30 | Fill #0

## 2020-03-26 SURGICAL SUPPLY — 59 items
BANDAGE ESMARK 6X9 LF (GAUZE/BANDAGES/DRESSINGS) ×1 IMPLANT
BIT DRILL CANNULATED 4.6 (BIT) ×3 IMPLANT
BIT DRILL LONG 3.1X160 (DRILL) ×1 IMPLANT
BIT DRILL SOLID LONG 2.8X160 (DRILL) ×1 IMPLANT
BLADE AVERAGE 25MMX9MM (BLADE) ×1
BLADE AVERAGE 25X9 (BLADE) ×2 IMPLANT
BLADE SAW SGTL MED 73X18.5 STR (BLADE) ×3 IMPLANT
BLADE SURG 10 STRL SS (BLADE) IMPLANT
BNDG COHESIVE 4X5 TAN STRL (GAUZE/BANDAGES/DRESSINGS) ×3 IMPLANT
BNDG ESMARK 6X9 LF (GAUZE/BANDAGES/DRESSINGS) ×3
BNDG GAUZE ELAST 4 BULKY (GAUZE/BANDAGES/DRESSINGS) ×3 IMPLANT
COVER MAYO STAND STRL (DRAPES) IMPLANT
COVER SURGICAL LIGHT HANDLE (MISCELLANEOUS) ×3 IMPLANT
COVER WAND RF STERILE (DRAPES) ×3 IMPLANT
DRAPE OEC MINIVIEW 54X84 (DRAPES) ×3 IMPLANT
DRAPE U-SHAPE 47X51 STRL (DRAPES) ×3 IMPLANT
DRILL LONG 3.1X160 (DRILL) ×3
DRILL SOLID LONG 2.8X160 (DRILL) ×3
DRSG ADAPTIC 3X8 NADH LF (GAUZE/BANDAGES/DRESSINGS) ×3 IMPLANT
DRSG EMULSION OIL 3X3 NADH (GAUZE/BANDAGES/DRESSINGS) ×3 IMPLANT
DURAPREP 26ML APPLICATOR (WOUND CARE) ×3 IMPLANT
ELECT REM PT RETURN 9FT ADLT (ELECTROSURGICAL) ×3
ELECTRODE REM PT RTRN 9FT ADLT (ELECTROSURGICAL) ×1 IMPLANT
GAUZE SPONGE 4X4 12PLY STRL (GAUZE/BANDAGES/DRESSINGS) ×3 IMPLANT
GLOVE BIOGEL PI IND STRL 9 (GLOVE) ×1 IMPLANT
GLOVE BIOGEL PI INDICATOR 9 (GLOVE) ×2
GLOVE ECLIPSE 8.0 STRL XLNG CF (GLOVE) ×3 IMPLANT
GLOVE SURG ORTHO 9.0 STRL STRW (GLOVE) ×3 IMPLANT
GOWN STRL REUS W/ TWL LRG LVL3 (GOWN DISPOSABLE) ×1 IMPLANT
GOWN STRL REUS W/ TWL XL LVL3 (GOWN DISPOSABLE) ×1 IMPLANT
GOWN STRL REUS W/TWL LRG LVL3 (GOWN DISPOSABLE) ×2
GOWN STRL REUS W/TWL XL LVL3 (GOWN DISPOSABLE) ×2
K-WIRE SINGLE TROCAR 2.3X230 (WIRE) ×3
KIT BASIN OR (CUSTOM PROCEDURE TRAY) ×3 IMPLANT
KIT TURNOVER KIT B (KITS) ×3 IMPLANT
KWIRE SINGLE TROCAR 2.3X230 (WIRE) ×1 IMPLANT
NS IRRIG 1000ML POUR BTL (IV SOLUTION) ×3 IMPLANT
PACK ORTHO EXTREMITY (CUSTOM PROCEDURE TRAY) ×3 IMPLANT
PAD ABD 8X10 STRL (GAUZE/BANDAGES/DRESSINGS) ×3 IMPLANT
PAD ARMBOARD 7.5X6 YLW CONV (MISCELLANEOUS) ×6 IMPLANT
PLATE ANT TTC FLAT LT (Plate) ×3 IMPLANT
PREVENA RESTOR AXIOFORM 29X28 (GAUZE/BANDAGES/DRESSINGS) ×3 IMPLANT
SCREW LOCK PLATE R3 4.2X28 (Screw) ×2 IMPLANT
SCREW LOCK PLT 28X4.2XNS GRLL (Screw) ×1 IMPLANT
SCREW NLOCK PLATE SB 4.5X30 (Plate) ×3 IMPLANT
SCREW NLOCK PLATE SB 4.5X34 (Screw) ×3 IMPLANT
SCREW NONLOCK PLATE R3 4.2X24 (Screw) ×3 IMPLANT
SCREW SHORT THRD 7.0X70X17 (Screw) ×3 IMPLANT
SCREW THRD LEN 7.0X100X32 (Screw) ×3 IMPLANT
SPONGE LAP 18X18 RF (DISPOSABLE) ×3 IMPLANT
SUCTION FRAZIER HANDLE 10FR (MISCELLANEOUS) ×2
SUCTION TUBE FRAZIER 10FR DISP (MISCELLANEOUS) ×1 IMPLANT
SUT ETHILON 2 0 PSLX (SUTURE) ×9 IMPLANT
TOWEL GREEN STERILE (TOWEL DISPOSABLE) ×3 IMPLANT
TOWEL GREEN STERILE FF (TOWEL DISPOSABLE) ×3 IMPLANT
TUBE CONNECTING 12'X1/4 (SUCTIONS) ×2
TUBE CONNECTING 12X1/4 (SUCTIONS) ×4 IMPLANT
WATER STERILE IRR 1000ML POUR (IV SOLUTION) ×3 IMPLANT
YANKAUER SUCT BULB TIP NO VENT (SUCTIONS) ×6 IMPLANT

## 2020-03-26 NOTE — Plan of Care (Signed)

## 2020-03-26 NOTE — Anesthesia Postprocedure Evaluation (Signed)
Anesthesia Post Note  Patient: Travis Mayo  Procedure(s) Performed: LEFT TIBIOCALCANEAL FUSION (Left Ankle)     Patient location during evaluation: PACU Anesthesia Type: General Level of consciousness: awake and alert and oriented Pain management: pain level controlled Vital Signs Assessment: post-procedure vital signs reviewed and stable Respiratory status: spontaneous breathing, nonlabored ventilation and respiratory function stable Cardiovascular status: blood pressure returned to baseline Postop Assessment: no apparent nausea or vomiting Anesthetic complications: no   No complications documented.       Kaylyn Layer

## 2020-03-26 NOTE — Anesthesia Procedure Notes (Signed)
Anesthesia Regional Block: Adductor canal block   Pre-Anesthetic Checklist: ,, timeout performed, Correct Patient, Correct Site, Correct Laterality, Correct Procedure, Correct Position, site marked, Risks and benefits discussed, pre-op evaluation,  At surgeon's request and post-op pain management  Laterality: Left  Prep: Maximum Sterile Barrier Precautions used, chloraprep       Needles:  Injection technique: Single-shot  Needle Type: Echogenic Stimulator Needle     Needle Length: 9cm  Needle Gauge: 22     Additional Needles:   Procedures:,,,, ultrasound used (permanent image in chart),,,,  Narrative:  Start time: 03/26/2020 7:08 AM End time: 03/26/2020 7:10 AM Injection made incrementally with aspirations every 5 mL.  Performed by: Personally  Anesthesiologist: Kaylyn Layer, MD  Additional Notes: Risks, benefits, and alternative discussed. Patient gave consent for procedure. Patient prepped and draped in sterile fashion. Sedation administered, patient remains easily responsive to voice. Relevant anatomy identified with ultrasound guidance. Local anesthetic given in 5cc increments with no signs or symptoms of intravascular injection. No pain or paraesthesias with injection. Patient monitored throughout procedure with signs of LAST or immediate complications. Tolerated well. Ultrasound image placed in chart.  Amalia Greenhouse, MD

## 2020-03-26 NOTE — H&P (Signed)
Travis Mayo is an 60 y.o. male.   Chief Complaint: Left Ankle Pain HPI: Patient presents in follow-up for Charcot arthropathy with swelling of the left ankle and foot.  Patient states that since he started wearing the medical compression stocking he has had noticed a decrease swelling he denies any pain he is quite pleased with the rapid improvement in his swelling.  Patient states he feels uncomfortable trying to weight-bear due to the instability of the ankle feeling like it is a bag of bones.  Past Medical History:  Diagnosis Date  . Arthritis   . Cataract   . Charcot's arthropathy    left  . Hypertension     Past Surgical History:  Procedure Laterality Date  . CATARACT EXTRACTION    . HERNIA REPAIR    . KNEE SURGERY      History reviewed. No pertinent family history. Social History:  reports that he has been smoking cigars. He has never used smokeless tobacco. He reports current alcohol use. He reports that he does not use drugs.  Allergies: No Known Allergies  Medications Prior to Admission  Medication Sig Dispense Refill  . ibuprofen (ADVIL,MOTRIN) 200 MG tablet Take 800 mg by mouth every 6 (six) hours as needed (for inflammation).     Marland Kitchen lisinopril (ZESTRIL) 20 MG tablet Take 20 mg by mouth daily.      Results for orders placed or performed during the hospital encounter of 03/24/20 (from the past 48 hour(s))  SARS CORONAVIRUS 2 (TAT 6-24 HRS) Nasopharyngeal Nasopharyngeal Swab     Status: None   Collection Time: 03/24/20  2:04 PM   Specimen: Nasopharyngeal Swab  Result Value Ref Range   SARS Coronavirus 2 NEGATIVE NEGATIVE    Comment: (NOTE) SARS-CoV-2 target nucleic acids are NOT DETECTED.  The SARS-CoV-2 RNA is generally detectable in upper and lower respiratory specimens during the acute phase of infection. Negative results do not preclude SARS-CoV-2 infection, do not rule out co-infections with other pathogens, and should not be used as the sole basis for  treatment or other patient management decisions. Negative results must be combined with clinical observations, patient history, and epidemiological information. The expected result is Negative.  Fact Sheet for Patients: HairSlick.no  Fact Sheet for Healthcare Providers: quierodirigir.com  This test is not yet approved or cleared by the [place] FDA and  has been authorized for detection and/or diagnosis of SARS-CoV-2 by FDA under an Emergency Use Authorization (EUA). This EUA will remain  in effect (meaning this test can be used) for the duration of the COVID-19 declaration under Se ction 564(b)(1) of the Act, 21 U.S.C. section 360bbb-3(b)(1), unless the authorization is terminated or revoked sooner.  Performed at [facility], [address], [place], [place] [address]    No results found.  Review of Systems  All other systems reviewed and are negative.   Blood pressure (!) 155/96, pulse (!) 111, temperature 97.7 F (36.5 C), temperature source Tympanic, resp. rate 18, height 6' (1.829 m), weight 93.9 kg, SpO2 100 %. Physical Exam  Patient is alert, oriented, no adenopathy, well-dressed, normal affect, normal respiratory effort.  patient has a strong dorsalis pedis pulse has a deformity of the hindfoot on with the prominence of the medial malleolus with pending skin breakdown.  Patient initially had a traumatic wound over the anterior compartment which appears to be the exact location of the superficial peroneal nerve exiting the anterior compartment.  Patient subjectively does not have sensation over the  dorsum of his foot consistent with a superficial peroneal nerve injury.  This may be the source of the neuropathy that has caused the Charcot collapse.  Patient has no plantar ulcers.  There is no redness no tenderness to palpation no draining ulcers.  Patient has a mildly elevated sed rate and CRP. Lungs Clear heart  RRR Assessment/Plan 1. Chronic pain of left knee   2. Charcot's joint, left ankle and foot     Plan: With the bony prominence medially and pending skin breakdown have discussed that since the swelling has decreased we should proceed with tibial calcaneal fusion.  This should provide him a stable hindfoot for weightbearing.  Risks and benefits of surgery were discussed including infection nonhealing incision nonhealing the bone need for additional surgery.  Patient states he understands wished to proceed at this time.    Bevely Palmer Nomie Buchberger, PA 03/26/2020, 6:33 AM

## 2020-03-26 NOTE — Op Note (Signed)
03/26/2020  9:02 AM  PATIENT:  Travis Mayo    PRE-OPERATIVE DIAGNOSIS:  Charcot Left Ankle  POST-OPERATIVE DIAGNOSIS:  Same  PROCEDURE:  LEFT TIBIOCALCANEAL FUSION Application axial form Prevena VAC. C arm fluoroscopy to verify reduction.  SURGEON:  Nadara Mustard, MD  PHYSICIAN ASSISTANT:None ANESTHESIA:   General  PREOPERATIVE INDICATIONS:  Tavone Caesar is a  59 y.o. male with a diagnosis of Charcot Left Ankle who failed conservative measures and elected for surgical management.    The risks benefits and alternatives were discussed with the patient preoperatively including but not limited to the risks of infection, bleeding, nerve injury, cardiopulmonary complications, the need for revision surgery, among others, and the patient was willing to proceed.  OPERATIVE IMPLANTS: Biomet anterior tibial calcaneal fusion pegs  @ENCIMAGES @  OPERATIVE FINDINGS: Sterile fluoroscopy verified alignment of the reduction.  OPERATIVE PROCEDURE: Patient was brought the operating room after undergoing a regional block he then underwent a general anesthetic.  After adequate levels anesthesia were obtained patient's left lower extremity was prepped using DuraPrep draped into a sterile field a timeout was called.  An anterior incision was made between the anterior tibial tendon and the EHL.  This was carried down to the joint subperiosteal dissection was used to identify the subtalar joint retractors were placed the neurovascular bundle was protected and a saw was used to debride the joint parallel and perpendicular to the long axis of the tibia.  The distal tibia and talus were debrided back to healthy viable bleeding bone.  There was extensive bony spurs in the medial gutter and the medial gutter was extensively debrided to allow for translation of the talus.  Ankle was held at 90 degrees and the anterior plate was placed this was the secured distally with 2 compression screws and one proximal screw  was placed in the lag hole to provide initial compression.  A guidewire was then used attached to an outrigger jig and a tibiotalar compression screw was placed.  The oblong compression screw was then tightened.  2 compression screws were placed proximally and then a tibial calcaneal screw was placed over a guidewire.  C-arm fluoroscopy verified alignment of both AP and lateral planes the ankle was at 90 degrees.  The wound was irrigated with normal saline throughout the case.  An Esmarch was used for hemostasis this was released and hemostasis was obtained.  The incision was closed using 2-0 nylon a Praveena axial form wound VAC with dressing was applied this had a good suction fit this was overwrapped with Coban patient was extubated taken the PACU in stable condition   DISCHARGE PLANNING:  Antibiotic duration: Preoperative antibiotics  Weightbearing: Nonweightbearing on the left  Pain medication: Prescription for Percocet  Dressing care/ Wound VAC: Continue wound VAC for 1 week  Ambulatory devices: Walker or crutches  Discharge to: Home.  Follow-up: In the office 1 week post operative.

## 2020-03-26 NOTE — Anesthesia Procedure Notes (Signed)
Anesthesia Regional Block: Popliteal block   Pre-Anesthetic Checklist: ,, timeout performed, Correct Patient, Correct Site, Correct Laterality, Correct Procedure, Correct Position, site marked, Risks and benefits discussed, pre-op evaluation,  At surgeon's request and post-op pain management  Laterality: Left  Prep: Maximum Sterile Barrier Precautions used, chloraprep       Needles:  Injection technique: Single-shot  Needle Type: Echogenic Stimulator Needle     Needle Length: 9cm  Needle Gauge: 22     Additional Needles:   Procedures:,,,, ultrasound used (permanent image in chart),,,,  Narrative:  Start time: 03/26/2020 7:10 AM End time: 03/26/2020 7:12 AM Injection made incrementally with aspirations every 5 mL.  Performed by: Personally  Anesthesiologist: Kaylyn Layer, MD  Additional Notes: Risks, benefits, and alternative discussed. Patient gave consent for procedure. Patient prepped and draped in sterile fashion. Sedation administered, patient remains easily responsive to voice. Relevant anatomy identified with ultrasound guidance. Local anesthetic given in 5cc increments with no signs or symptoms of intravascular injection. No pain or paraesthesias with injection. Patient monitored throughout procedure with signs of LAST or immediate complications. Tolerated well. Ultrasound image placed in chart.  Amalia Greenhouse, MD

## 2020-03-26 NOTE — TOC Initial Note (Addendum)
Transition of Care [facility]) - Initial/Assessment Note    Patient Details  Name: Travis Mayo MRN: [id number] Date of Birth: January 28, 1961  [facility] CM/SW Contact:    Curlene Labrum, RN Phone Number: 03/26/2020, 1:55 PM  Clinical Narrative:                 Case management met with the patient postoperatively today after surgery for Charcot Left ankle.  The patient lives alone and does not have insurance.  The patient will be having a friend help him with transportation and needs outside of the hospital.  The patient is currently using crutches that are borrowed from a friend and a CAM walker boot at home that was given to him by Dr. Sharol Given.  The patient is waiting on a PT and OT eval today to evaluate for home needs regarding ambulation, dme and therapy post-operatively.  Will continue to follow after PT/OT evaluation.  Expected Discharge Plan: Home/Self Care Barriers to Discharge: No Barriers Identified   Patient Goals and CMS Choice Patient states their goals for this hospitalization and ongoing recovery are:: The patient is ready to get better. CMS Medicare.gov Compare Post Acute Care list provided to:: Patient    Expected Discharge Plan and Services Expected Discharge Plan: Home/Self Care   Discharge Planning Services: CM Consult   Living arrangements for the past 2 months: Apartment Expected Discharge Date: 03/27/20                                    Prior Living Arrangements/Services Living arrangements for the past 2 months: Apartment Lives with:: Self Patient language and need for interpreter reviewed:: Yes Do you feel safe going back to the place where you live?: Yes      Need for Family Participation in Patient Care: Yes (Comment)   Current home services:  (Patient has a CAM walker boot at home.) Criminal Activity/Legal Involvement Pertinent to Current Situation/Hospitalization: No - Comment as needed  Activities of Daily Living Home  Assistive Devices/Equipment: Crutches, Eyeglasses, Blood pressure cuff ADL Screening (condition at time of admission) Patient's cognitive ability adequate to safely complete daily activities?: Yes Is the patient deaf or have difficulty hearing?: No Does the patient have difficulty seeing, even when wearing glasses/contacts?: No Does the patient have difficulty concentrating, remembering, or making decisions?: No Patient able to express need for assistance with ADLs?: Yes Does the patient have difficulty dressing or bathing?: No Independently performs ADLs?: No Does the patient have difficulty walking or climbing stairs?: Yes Weakness of Legs: None Weakness of Arms/Hands: None  Permission Sought/Granted Permission sought to share information with : Case Manager Permission granted to share information with : Yes, Verbal Permission Granted     Permission granted to share info w AGENCY: TOC pharmacy        Emotional Assessment Appearance:: Appears stated age Attitude/Demeanor/Rapport: Self-Confident Affect (typically observed): Accepting Orientation: : Oriented to Self, Oriented to Place, Oriented to  Time, Oriented to Situation Alcohol / Substance Use: Not Applicable Psych Involvement: No (comment)  Admission diagnosis:  Left ankle pain [M25.572] Patient Active Problem List   Diagnosis Date Noted  . Left ankle pain 03/26/2020  . Charcot's joint, left ankle and foot    PCP:  Beverley Fiedler, FNP Pharmacy:   [id number] [place] [id number]: [phone]: [phone]  Social Determinants of Health (SDOH) Interventions    Readmission Risk Interventions No flowsheet data found.

## 2020-03-26 NOTE — Progress Notes (Signed)
Orthopedic Tech Progress Note Patient Details:  Travis Mayo May 20, 1961 [id number] Went to service patient but when I got there with the CAM Henrietta Dine, patient stated MD DUDA had already gave him a boot like that. I asked if he could have someone bring it up here and he said he could but it would be later today. So I had the RN come witness the conversation and I reached out to to PA PERSONS. And told her what patient had said and she said that was ok. Patient ID: Travis Mayo, male   DOB: 05-26-1961, 59 y.o.   MRN: [id number]   Donald Pore 03/26/2020, 11:14 AM

## 2020-03-26 NOTE — Anesthesia Procedure Notes (Addendum)
Procedure Name: LMA Insertion Date/Time: 03/26/2020 7:38 AM Performed by: Marny Lowenstein, CRNA Pre-anesthesia Checklist: Patient identified, Emergency Drugs available, Suction available and Patient being monitored Patient Re-evaluated:Patient Re-evaluated prior to induction Oxygen Delivery Method: Circle system utilized Preoxygenation: Pre-oxygenation with 100% oxygen Induction Type: IV induction Ventilation: Mask ventilation without difficulty and Oral airway inserted - appropriate to patient size LMA: LMA inserted LMA Size: 5.0 Number of attempts: 2 Placement Confirmation: positive ETCO2 and breath sounds checked- equal and bilateral Tube secured with: Tape Dental Injury: Teeth and Oropharynx as per pre-operative assessment  Comments: Pt allowing manual ventilation; LMA inserted easily; unable to generate adequate TV. Pt coughing. LMA removed and pt deepened while oral airway inserted and suctioned. VSS througout.

## 2020-03-26 NOTE — Transfer of Care (Signed)
Immediate Anesthesia Transfer of Care Note  Patient: Travis Mayo  Procedure(s) Performed: LEFT TIBIOCALCANEAL FUSION (Left Ankle)  Patient Location: PACU  Anesthesia Type:General and Regional  Level of Consciousness: drowsy and patient cooperative  Airway & Oxygen Therapy: Patient Spontanous Breathing and Patient connected to face mask oxygen  Post-op Assessment: Report given to RN and Post -op Vital signs reviewed and stable  Post vital signs: Reviewed and stable  Last Vitals:  Vitals Value Taken Time  BP 113/76 03/26/20 0904  Temp    Pulse 106 03/26/20 0905  Resp 16 03/26/20 0905  SpO2 97 % 03/26/20 0905  Vitals shown include unvalidated device data.  Last Pain:  Vitals:   03/26/20 0612  TempSrc:   PainSc: 0-No pain         Complications: No complications documented.

## 2020-03-26 NOTE — Progress Notes (Signed)
Discharge instruction packet printed and reviewed with the patient.  The patient reports that he has Cam boot and crutches.  The patient verbalizes understanding those instructions and follow up with Dr Lajoyce Corners.  The patient will be discharged with wound vac and plans to continue x 1 week.

## 2020-03-26 NOTE — Evaluation (Signed)
Physical Therapy Evaluation Patient Details Name: Travis Mayo MRN: [id number] DOB: Oct 07, 1960 Today's Date: 03/26/2020   History of Present Illness  Pt is a 59 y/o male s/p LEFT TIBIOCALCANEAL FUSION. PMH including but not limited to HTN and Charcot arthropathy.    Clinical Impression  Pt presented supine in bed with HOB elevated, awake and willing to participate in therapy session. Prior to admission, pt reported that he has been ambulating with bilateral axillary crutches and maintaining NWB'ing L LE for ~1 week prior to surgery as he was unable to tolerate any WB'ing on L LE. Pt lives alone in a two level townhouse (able to live on main level) with a few steps to enter. Pt stated that he has a friend that could and would provide assistance if needed upon d/c. At the time of evaluation, pt overall at a modified independent level with all functional mobility with use of bilateral axillary crutches. No LOB or need for physical assistance. PT answered all of pt's questions at end of session. No further acute PT needs identified at this time. PT signing off.     Follow Up Recommendations No PT follow up    Equipment Recommendations  None recommended by PT    Recommendations for Other Services       Precautions / Restrictions Precautions Precautions: Other (comment) Precaution Comments: wound VAC to L foot Restrictions Weight Bearing Restrictions: Yes LLE Weight Bearing: Non weight bearing      Mobility  Bed Mobility Overal bed mobility: Modified Independent                Transfers Overall transfer level: Modified independent Equipment used: Crutches             General transfer comment: good technique utilized; maintained NWB'ing L LE independently throughout  Ambulation/Gait Ambulation/Gait assistance: Modified independent (Device/Increase time)   Assistive device: Crutches Gait Pattern/deviations:  (hop-to on R LE) Gait velocity: decr   General Gait  Details: pt steady overall with use of bilateral axillary crutches, able to maintain NWB'ing L LE independently with no instability noted  Stairs            Wheelchair Mobility    Modified Rankin (Stroke Patients Only)       Balance Overall balance assessment: Needs assistance Sitting-balance support: No upper extremity supported Sitting balance-Leahy Scale: Good     Standing balance support: During functional activity Standing balance-Leahy Scale: Fair                               Pertinent Vitals/Pain Pain Assessment: No/denies pain    Home Living Family/patient expects to be discharged to:: Private residence Living Arrangements: Alone Available Help at Discharge: Friend(s);Available PRN/intermittently Type of Home: Other(Comment) (townhouse) Home Access: Stairs to enter   [employer] of Steps: 3 Home Layout: Able to live on main level with bedroom/bathroom Home Equipment: Shower seat;Crutches      Prior Function Level of Independence: Independent with assistive device(s)         Comments: has been ambulating with bilateral axillary crutches and maintaining NWB L LE for the past week without difficulties     Hand Dominance        Extremity/Trunk Assessment   Upper Extremity Assessment Upper Extremity Assessment: Overall WFL for tasks assessed    Lower Extremity Assessment Lower Extremity Assessment: LLE deficits/detail LLE Deficits / Details: pt with wound VAC in place; completely numb at foot  and toes, unable to flex/extend toes    Cervical / Trunk Assessment Cervical / Trunk Assessment: Normal  Communication   Communication: No difficulties  Cognition Arousal/Alertness: Awake/alert Behavior During Therapy: WFL for tasks assessed/performed Overall Cognitive Status: Within Functional Limits for tasks assessed                                        General Comments      Exercises General Exercises  - Lower Extremity Long Arc Quad: AROM;Strengthening;Left;20 reps;Seated Hip Flexion/Marching: AROM;Strengthening;Left;15 reps;Seated   Assessment/Plan    PT Assessment Patent does not need any further PT services  PT Problem List         PT Treatment Interventions      PT Goals (Current goals can be found in the Care Plan section)  Acute Rehab PT Goals Patient Stated Goal: "home today" PT Goal Formulation: All assessment and education complete, DC therapy    Frequency     Barriers to discharge        Co-evaluation               AM-PAC PT "6 Clicks" Mobility  Outcome Measure Help needed turning from your back to your side while in a flat bed without using bedrails?: None Help needed moving from lying on your back to sitting on the side of a flat bed without using bedrails?: None Help needed moving to and from a bed to a chair (including a wheelchair)?: None Help needed standing up from a chair using your arms (e.g., wheelchair or bedside chair)?: None Help needed to walk in hospital room?: None Help needed climbing 3-5 steps with a railing? : None 6 Click Score: 24    End of Session Equipment Utilized During Treatment: Gait belt Activity Tolerance: Patient tolerated treatment well Patient left: in bed;with call bell/phone within reach Nurse Communication: Mobility status PT Visit Diagnosis: Other abnormalities of gait and mobility (R26.89)    Time: 2979-8921 PT Time Calculation (min) (ACUTE ONLY): 23 min   Charges:   PT Evaluation $PT Eval Low Complexity: 1 Low PT Treatments $Therapeutic Activity: 8-22 mins        Anastasio Champion, DPT  Acute Rehabilitation Services Pager [phone] Office [place] 03/26/2020, 4:13 PM

## 2020-03-29 ENCOUNTER — Encounter (HOSPITAL_COMMUNITY): Payer: Self-pay | Admitting: Orthopedic Surgery

## 2020-03-29 NOTE — Discharge Summary (Signed)
Discharge Diagnoses:  Active Problems:   Charcot's joint, left ankle and foot   Left ankle pain   Surgeries: Procedure(s): LEFT TIBIOCALCANEAL FUSION on 03/26/2020    Consultants:   Discharged Condition: Improved  Hospital Course: Travis Mayo is an 59 y.o. male who was admitted 03/26/2020 with a chief complaint of left Ankle pain, with a final diagnosis of Charcot Left Ankle.  Patient was brought to the operating room on 03/26/2020 and underwent Procedure(s): LEFT TIBIOCALCANEAL FUSION.    Patient was given perioperative antibiotics:  Anti-infectives (From admission, onward)   Start     Dose/Rate Route Frequency Ordered Stop   03/26/20 1400  ceFAZolin (ANCEF) IVPB 2g/100 mL premix  Status:  Discontinued        2 g 200 mL/hr over 30 Minutes Intravenous Every 6 hours 03/26/20 1041 03/26/20 2138   03/26/20 0600  ceFAZolin (ANCEF) IVPB 2g/100 mL premix        2 g 200 mL/hr over 30 Minutes Intravenous On call to O.R. 03/26/20 1191 03/26/20 4782    .  Patient was given sequential compression devices, early ambulation, and aspirin for DVT prophylaxis.  Recent vital signs: No data found..  Recent laboratory studies: No results found.  Discharge Medications:   Allergies as of 03/26/2020   No Known Allergies     Medication List    TAKE these medications   aspirin 325 MG tablet Take 1 tablet (325 mg total) by mouth daily.   ibuprofen 200 MG tablet Commonly known as: ADVIL Take 800 mg by mouth every 6 (six) hours as needed (for inflammation).   lisinopril 20 MG tablet Commonly known as: ZESTRIL Take 20 mg by mouth daily.   oxyCODONE-acetaminophen 5-325 MG tablet Commonly known as: Percocet Take 1 tablet by mouth every 4 (four) hours as needed.       Diagnostic Studies: DG Ankle Complete Left  Result Date: 03/10/2020 CLINICAL DATA:  Possible infection. EXAM: LEFT FOOT - COMPLETE 3+ VIEW; LEFT ANKLE COMPLETE - 3+ VIEW COMPARISON:  March 07, 2020 MRI. FINDINGS: Again  noted are destructive changes of the distal tibia and fibula with extensive surrounding soft tissue swelling. Multiple small relatively well corticated osseous fragments are noted about the distal fibula and medial malleolus. There is an abnormal appearance the mortise joint with a large joint effusion. There is no definite acute displaced fracture. There is a small plantar calcaneal spur. Vascular calcifications are noted. IMPRESSION: Again noted are advanced destructive changes of the distal tibia and fibula concerning for osteomyelitis as seen on the patient's prior ankle MRI. Electronically Signed   By: Katherine Mantle M.D.   On: 03/10/2020 20:02   MR ANKLE LEFT WO CONTRAST  Result Date: 03/08/2020 CLINICAL DATA:  Intermittent diffuse left ankle pain and swelling for 20 months, worse over the past 10 weeks. History of a fall and left ankle wound in 2019. EXAM: MRI OF THE LEFT ANKLE WITHOUT CONTRAST TECHNIQUE: Multiplanar, multisequence MR imaging of the ankle was performed. No intravenous contrast was administered. COMPARISON:  None. FINDINGS: TENDONS Peroneal: Intact. Posteromedial: Intact. Anterior: Intact. Achilles: Intact. Plantar Fascia: Intact with normal signal. LIGAMENTS Lateral: The anterior talofibular ligament and calcaneofibular ligament not visualized and likely torn. Medial: Edema in the deep fibers the deltoid ligament is compatible with sprain. The ligament appears intact. CARTILAGE Ankle Joint: Cartilage is completely denuded. The anterior 50% of the articular surface of the tibia is eroded and the tibia is subluxed posteriorly along the posterior margin of the talus  and calcaneus. Subtalar Joints/Sinus Tarsi: Fat signal within the sinus tarsi is preserved. Mild edema is seen about the posterior facet of the subtalar joint. Bones: Abnormal marrow signal is seen in the visualized distal tibia, talus with the exception of the distal neck, distal fibula and superior 2.3 cm of the posterior  calcaneus. There is complex fluid within the neck of the talus measuring 3.4 cm long by 1 cm transverse by 1.2 cm craniocaudal. The patient has severe hindfoot valgus which is new since the prior plain films. The distal fibula is eroded. Other: Intense subcutaneous edema is present diffusely. IMPRESSION: Grossly abnormal appearance of the distal tibia, fibula, posterior talus and posterior calcaneus most suggestive chronic osteomyelitis with a likely abscess within the neck of the talus. Severe hindfoot valgus is new since the prior plain films and there is a complex fluid collection within the talus worrisome for abscess. Intense subcutaneous edema about the ankle worrisome for cellulitis given findings above. Tear of the anterior talofibular and likely calcaneofibular ligaments. Sprain of the deltoid without tear is also identified. Electronically Signed   By: Drusilla Kanner M.D.   On: 03/08/2020 11:27   DG Foot Complete Left  Result Date: 03/10/2020 CLINICAL DATA:  Possible infection. EXAM: LEFT FOOT - COMPLETE 3+ VIEW; LEFT ANKLE COMPLETE - 3+ VIEW COMPARISON:  March 07, 2020 MRI. FINDINGS: Again noted are destructive changes of the distal tibia and fibula with extensive surrounding soft tissue swelling. Multiple small relatively well corticated osseous fragments are noted about the distal fibula and medial malleolus. There is an abnormal appearance the mortise joint with a large joint effusion. There is no definite acute displaced fracture. There is a small plantar calcaneal spur. Vascular calcifications are noted. IMPRESSION: Again noted are advanced destructive changes of the distal tibia and fibula concerning for osteomyelitis as seen on the patient's prior ankle MRI. Electronically Signed   By: Katherine Mantle M.D.   On: 03/10/2020 20:02   DG MINI C-ARM IMAGE ONLY  Result Date: 03/26/2020 There is no interpretation for this exam.  This order is for images obtained during a surgical procedure.   Please See "Surgeries" Tab for more information regarding the procedure.    Patient benefited maximally from their hospital stay and there were no complications.     Disposition: Discharge disposition: 01-Home or Self Care      Discharge Instructions    Call MD / Call 911   Complete by: As directed    If you experience chest pain or shortness of breath, CALL 911 and be transported to the hospital emergency room.  If you develope a fever above 101 F, pus (white drainage) or increased drainage or redness at the wound, or calf pain, call your surgeon's office.   Constipation Prevention   Complete by: As directed    Drink plenty of fluids.  Prune juice may be helpful.  You may use a stool softener, such as Colace (over the counter) 100 mg twice a day.  Use MiraLax (over the counter) for constipation as needed.   Diet - low sodium heart healthy   Complete by: As directed    Discharge instructions   Complete by: As directed    Elevate left Ankle. Nonweight bearing.   Increase activity slowly as tolerated   Complete by: As directed    Negative Pressure Wound Therapy - Incisional   Complete by: As directed    Show patient how to attach prevena vac      Follow-up  Information    Sherial Ebrahim, [place], [place] In 1 week.   Specialty: Orthopedic Surgery Contact information: [address] [place] [address] [phone]                Signed: [place] Makaio Mach 03/29/2020, 6:37 AM

## 2020-04-01 ENCOUNTER — Ambulatory Visit (INDEPENDENT_AMBULATORY_CARE_PROVIDER_SITE_OTHER): Payer: Self-pay | Admitting: Physician Assistant

## 2020-04-01 ENCOUNTER — Encounter: Payer: Self-pay | Admitting: Physician Assistant

## 2020-04-01 ENCOUNTER — Other Ambulatory Visit: Payer: Self-pay

## 2020-04-01 VITALS — Ht 72.0 in | Wt 207.0 lb

## 2020-04-01 DIAGNOSIS — M14672 Charcot's joint, left ankle and foot: Secondary | ICD-10-CM

## 2020-04-01 NOTE — Progress Notes (Signed)
Office Visit Note   Patient: Travis Mayo           Date of Birth: June 09, 1961           MRN: [id number] Visit Date: 04/01/2020              Requested by: Felix Pacini, FNP [address] [place],  [place] [address] PCP: Felix Pacini, FNP  Chief Complaint  Patient presents with  . Left Ankle - Routine Post Op    03/26/20 left tibial calcaneal fusion       HPI: Patient presents today 1 week status post tibial calcaneal fusion.  He has been nonweightbearing with crutches.  He states his pain is under good control.  Assessment & Plan: Visit Diagnoses: No diagnosis found.  Plan: Discussed with him the importance of elevating his ankle and foot above heart level.  He will continue to be nonweightbearing and I would like him wearing the boot to immobilize is better.  He will follow-up in 1 week and should have x-rays of his ankle taken  Follow-Up Instructions: No follow-ups on file.   Ortho Exam  Patient is alert, oriented, no adenopathy, well-dressed, normal affect, normal respiratory effort. Focused examination demonstrates moderate soft tissue swelling but no erythema or cellulitis.  Wound is well approximated with just some bloody drainage.  No necrosis.  No foul odor or dehiscence.  Compartments are soft and compressible  Imaging: No results found. No images are attached to the encounter.  Labs: Lab Results  Component Value Date   ESRSEDRATE 29 (H) 03/10/2020   CRP 1.2 (H) 03/10/2020     Lab Results  Component Value Date   ALBUMIN 3.8 03/10/2020   ALBUMIN 4.1 10/07/2018    No results found for: MG No results found for: VD25OH  No results found for: PREALBUMIN CBC EXTENDED Latest Ref Rng & Units 03/26/2020 03/10/2020 10/07/2018  WBC 4.0 - 10.5 K/uL 10.4 11.4(H) 10.4  RBC 4.22 - 5.81 MIL/uL 4.20(L) 4.07(L) 4.98  HGB 13.0 - 17.0 g/dL 12.7(L) 12.6(L) 15.0  HCT 39 - 52 % 39.1 39.1 45.6  PLT 150 - 400 K/uL 458(H) 366 333  NEUTROABS 1.7 - 7.7 K/uL -  8.8(H) 8.5(H)  LYMPHSABS 0.7 - 4.0 K/uL - 1.5 1.0     Body mass index is 28.07 kg/m.  Orders:  No orders of the defined types were placed in this encounter.  No orders of the defined types were placed in this encounter.    Procedures: No procedures performed  Clinical Data: No additional findings.  ROS:  All other systems negative, except as noted in the HPI. Review of Systems  Objective: Vital Signs: Ht 6' (1.829 m)   Wt 207 lb (93.9 kg)   BMI 28.07 kg/m   Specialty Comments:  No specialty comments available.  PMFS History: Patient Active Problem List   Diagnosis Date Noted  . Left ankle pain 03/26/2020  . Charcot's joint, left ankle and foot    Past Medical History:  Diagnosis Date  . Arthritis   . Cataract   . Charcot's arthropathy    left  . Hypertension     No family history on file.  Past Surgical History:  Procedure Laterality Date  . ANKLE FUSION Left 03/26/2020   Procedure: LEFT TIBIOCALCANEAL FUSION;  Surgeon: Nadara Mustard, MD;  Location: [facility] OR;  Service: Orthopedics;  Laterality: Left;  . CATARACT EXTRACTION    . HERNIA REPAIR    . KNEE SURGERY  Social History   Occupational History  . Not on file  Tobacco Use  . Smoking status: Current Some Day Smoker    Types: Cigars  . Smokeless tobacco: Never Used  Vaping Use  . Vaping Use: Never used  Substance and Sexual Activity  . Alcohol use: Yes    Comment: social  . Drug use: No  . Sexual activity: Not on file

## 2020-04-05 ENCOUNTER — Encounter: Payer: Self-pay | Admitting: Orthopedic Surgery

## 2020-04-09 ENCOUNTER — Other Ambulatory Visit: Payer: Self-pay

## 2020-04-09 ENCOUNTER — Ambulatory Visit (INDEPENDENT_AMBULATORY_CARE_PROVIDER_SITE_OTHER): Payer: Self-pay

## 2020-04-09 ENCOUNTER — Encounter: Payer: Self-pay | Admitting: Physician Assistant

## 2020-04-09 ENCOUNTER — Ambulatory Visit (INDEPENDENT_AMBULATORY_CARE_PROVIDER_SITE_OTHER): Payer: Self-pay | Admitting: Physician Assistant

## 2020-04-09 VITALS — Ht 72.0 in | Wt 207.0 lb

## 2020-04-09 DIAGNOSIS — M14672 Charcot's joint, left ankle and foot: Secondary | ICD-10-CM

## 2020-04-09 NOTE — Progress Notes (Signed)
Office Visit Note   Patient: Travis Mayo           Date of Birth: 1961/09/09           MRN: [id number] Visit Date: 04/09/2020              Requested by: Felix Pacini, FNP [address] [place],  [place] [address] PCP: Felix Pacini, FNP  Chief Complaint  Patient presents with  . Left Ankle - Routine Post Op    03/26/20 left tibial calcaneal fusion         HPI: Patient presents 2 weeks status post left ankle arthrodesis.  He has been nonweightbearing with the boot however he is not wearing the boot today.  Overall he has good pain control he is just concerned about the swelling.  He said it was slightly red a couple days ago but today it just seems more purple  Assessment & Plan: Visit Diagnoses:  1. Charcot's joint, left ankle and foot     Plan: I emphasized the importance to the patient of immobilizing his ankle in a boot with regards to the fusion.  I also would like for him to go back into his VIVE compression stockings.  He should wear these against the skin.  Change them daily and should wear them around-the-clock.  Follow-up in 1 week.  Follow-Up Instructions: No follow-ups on file.   Ortho Exam  Patient is alert, oriented, no adenopathy, well-dressed, normal affect, normal respiratory effort. Focused examination demonstrates moderate soft tissue swelling.  Sutures are intact he does have some slight dehiscence because of the swelling.  No necrotic wound edges no surrounding cellulitis with his ankle in a dependent position he has a slight purple hue.  Pulses are strong and palpable no evidence of any infection  Imaging: No results found. No images are attached to the encounter.  Labs: Lab Results  Component Value Date   ESRSEDRATE 29 (H) 03/10/2020   CRP 1.2 (H) 03/10/2020     Lab Results  Component Value Date   ALBUMIN 3.8 03/10/2020   ALBUMIN 4.1 10/07/2018    No results found for: MG No results found for: VD25OH  No results found  for: PREALBUMIN CBC EXTENDED Latest Ref Rng & Units 03/26/2020 03/10/2020 10/07/2018  WBC 4.0 - 10.5 K/uL 10.4 11.4(H) 10.4  RBC 4.22 - 5.81 MIL/uL 4.20(L) 4.07(L) 4.98  HGB 13.0 - 17.0 g/dL 12.7(L) 12.6(L) 15.0  HCT 39 - 52 % 39.1 39.1 45.6  PLT 150 - 400 K/uL 458(H) 366 333  NEUTROABS 1.7 - 7.7 K/uL - 8.8(H) 8.5(H)  LYMPHSABS 0.7 - 4.0 K/uL - 1.5 1.0     Body mass index is 28.07 kg/m.  Orders:  Orders Placed This Encounter  Procedures  . XR Ankle 2 Views Left   No orders of the defined types were placed in this encounter.    Procedures: No procedures performed  Clinical Data: No additional findings.  ROS:  All other systems negative, except as noted in the HPI. Review of Systems  Objective: Vital Signs: Ht 6' (1.829 m)   Wt 207 lb (93.9 kg)   BMI 28.07 kg/m   Specialty Comments:  No specialty comments available.  PMFS History: Patient Active Problem List   Diagnosis Date Noted  . Left ankle pain 03/26/2020  . Charcot's joint, left ankle and foot    Past Medical History:  Diagnosis Date  . Arthritis   . Cataract   . Charcot's arthropathy  left  . Hypertension     No family history on file.  Past Surgical History:  Procedure Laterality Date  . ANKLE FUSION Left 03/26/2020   Procedure: LEFT TIBIOCALCANEAL FUSION;  Surgeon: Nadara Mustard, MD;  Location: [facility] OR;  Service: Orthopedics;  Laterality: Left;  . CATARACT EXTRACTION    . HERNIA REPAIR    . KNEE SURGERY     Social History   Occupational History  . Not on file  Tobacco Use  . Smoking status: Current Some Day Smoker    Types: Cigars  . Smokeless tobacco: Never Used  Vaping Use  . Vaping Use: Never used  Substance and Sexual Activity  . Alcohol use: Yes    Comment: social  . Drug use: No  . Sexual activity: Not on file

## 2020-04-19 ENCOUNTER — Other Ambulatory Visit: Payer: Self-pay

## 2020-04-19 ENCOUNTER — Encounter: Payer: Self-pay | Admitting: Physician Assistant

## 2020-04-19 ENCOUNTER — Ambulatory Visit (INDEPENDENT_AMBULATORY_CARE_PROVIDER_SITE_OTHER): Payer: Self-pay | Admitting: Physician Assistant

## 2020-04-19 VITALS — Ht 72.0 in | Wt 207.0 lb

## 2020-04-19 DIAGNOSIS — M14672 Charcot's joint, left ankle and foot: Secondary | ICD-10-CM

## 2020-04-19 NOTE — Progress Notes (Signed)
Office Visit Note   Patient: Travis Mayo           Date of Birth: 1961/05/22           MRN: [id number] Visit Date: 04/19/2020              Requested by: Felix Pacini, FNP [address] [place],  [place] [address] PCP: Felix Pacini, FNP  Chief Complaint  Patient presents with  . Left Ankle - Follow-up    03/26/2020 left tibiocalcaneal fusion      HPI this is a pleasant gentleman who is now 4 weeks status post left tibial calcaneal fusion he has been struggling with swelling and at his last visit was placed in VIVE compression stockings which she has been wearing against the skin.  Although he still has some swelling in the foot he overall feels the swelling has been proved he does have some redness which improves with elevation  Assessment & Plan: Visit Diagnoses: No diagnosis found.  Plan: Patient will continue to use the sock.  Should follow-up in 2 weeks x-rays of his ankle should be taken at that visit  Follow-Up Instructions: No follow-ups on file.   Ortho Exam  Patient is alert, oriented, no adenopathy, well-dressed, normal affect, normal respiratory effort. Periphery of the incision is old and sutures were harvested today.  Mild to moderate soft tissue swelling in his foot.  This is better than previous exams as is the swelling in his ankle.  Some wound dehiscence superficially in the central portion of the wound the sutures remained intact No ascending cellulitis no fluctuance no foul odor Imaging: No results found. No images are attached to the encounter.  Labs: Lab Results  Component Value Date   ESRSEDRATE 29 (H) 03/10/2020   CRP 1.2 (H) 03/10/2020     Lab Results  Component Value Date   ALBUMIN 3.8 03/10/2020   ALBUMIN 4.1 10/07/2018    No results found for: MG No results found for: VD25OH  No results found for: PREALBUMIN CBC EXTENDED Latest Ref Rng & Units 03/26/2020 03/10/2020 10/07/2018  WBC 4.0 - 10.5 K/uL 10.4 11.4(H) 10.4  RBC  4.22 - 5.81 MIL/uL 4.20(L) 4.07(L) 4.98  HGB 13.0 - 17.0 g/dL 12.7(L) 12.6(L) 15.0  HCT 39 - 52 % 39.1 39.1 45.6  PLT 150 - 400 K/uL 458(H) 366 333  NEUTROABS 1.7 - 7.7 K/uL - 8.8(H) 8.5(H)  LYMPHSABS 0.7 - 4.0 K/uL - 1.5 1.0     Body mass index is 28.07 kg/m.  Orders:  No orders of the defined types were placed in this encounter.  No orders of the defined types were placed in this encounter.    Procedures: No procedures performed  Clinical Data: No additional findings.  ROS:  All other systems negative, except as noted in the HPI. Review of Systems  Objective: Vital Signs: Ht 6' (1.829 m)   Wt 207 lb (93.9 kg)   BMI 28.07 kg/m   Specialty Comments:  No specialty comments available.  PMFS History: Patient Active Problem List   Diagnosis Date Noted  . Left ankle pain 03/26/2020  . Charcot's joint, left ankle and foot    Past Medical History:  Diagnosis Date  . Arthritis   . Cataract   . Charcot's arthropathy    left  . Hypertension     No family history on file.  Past Surgical History:  Procedure Laterality Date  . ANKLE FUSION Left 03/26/2020   Procedure: LEFT TIBIOCALCANEAL  FUSION;  Surgeon: Nadara Mustard, MD;  Location[facility];  Service: Orthopedics;  Laterality: Left;  . CATARACT EXTRACTION    . HERNIA REPAIR    . KNEE SURGERY     Social History   Occupational History  . Not on file  Tobacco Use  . Smoking status: Current Some Day Smoker    Types: Cigars  . Smokeless tobacco: Never Used  Vaping Use  . Vaping Use: Never used  Substance and Sexual Activity  . Alcohol use: Yes    Comment: social  . Drug use: No  . Sexual activity: Not on file

## 2020-05-03 ENCOUNTER — Encounter: Payer: Self-pay | Admitting: Physician Assistant

## 2020-05-03 ENCOUNTER — Ambulatory Visit (INDEPENDENT_AMBULATORY_CARE_PROVIDER_SITE_OTHER): Payer: Self-pay | Admitting: Physician Assistant

## 2020-05-03 ENCOUNTER — Ambulatory Visit: Payer: Self-pay

## 2020-05-03 VITALS — Ht 72.0 in | Wt 207.0 lb

## 2020-05-03 DIAGNOSIS — M14672 Charcot's joint, left ankle and foot: Secondary | ICD-10-CM

## 2020-05-03 NOTE — Progress Notes (Signed)
Office Visit Note   Patient: Travis Mayo           Date of Birth: [birth year]           MRN: [id number] Visit Date: 05/03/2020              Requested by: Felix Pacini, FNP [address] [place],  [place] [address] PCP: Felix Pacini, FNP  Chief Complaint  Patient presents with  . Left Ankle - Follow-up    Left tibiocalcaneal fusion 03/26/2020      HPI: Patient is 5 weeks status post left ankle tibial calcaneal arthrodesis.  He is nonweightbearing crutches denies fever chills or calf pain  Assessment & Plan: Visit Diagnoses:  1. Charcot's joint, left ankle and foot     Plan: Patient may or may bear up to 50% weight in his boot with his crutches.  We will follow up in 3 weeks new x-rays should be taken at that time  Follow-Up Instructions: No follow-ups on file.   Ortho Exam  Patient is alert, oriented, no adenopathy, well-dressed, normal affect, normal respiratory effort. He did have some minor wound dehiscence which is at the superficial surface of the skin.  Surgical sutures were harvested today no surrounding cellulitis or drainage other than a small amount of bloody drainage after removing surgical stitches.  Moderate soft tissue swelling no cellulitis nontender to palpation  Imaging: No results found. No images are attached to the encounter.  Labs: Lab Results  Component Value Date   ESRSEDRATE 29 (H) 03/10/2020   CRP 1.2 (H) 03/10/2020     Lab Results  Component Value Date   ALBUMIN 3.8 03/10/2020   ALBUMIN 4.1 10/07/2018    No results found for: MG No results found for: VD25OH  No results found for: PREALBUMIN CBC EXTENDED Latest Ref Rng & Units 03/26/2020 03/10/2020 10/07/2018  WBC 4.0 - 10.5 K/uL 10.4 11.4(H) 10.4  RBC 4.22 - 5.81 MIL/uL 4.20(L) 4.07(L) 4.98  HGB 13.0 - 17.0 g/dL 12.7(L) 12.6(L) 15.0  HCT 39 - 52 % 39.1 39.1 45.6  PLT 150 - 400 K/uL 458(H) 366 333  NEUTROABS 1.7 - 7.7 K/uL - 8.8(H) 8.5(H)  LYMPHSABS 0.7 - 4.0 K/uL -  1.5 1.0     Body mass index is 28.07 kg/m.  Orders:  Orders Placed This Encounter  Procedures  . XR Ankle Complete Left   No orders of the defined types were placed in this encounter.    Procedures: No procedures performed  Clinical Data: No additional findings.  ROS:  All other systems negative, except as noted in the HPI. Review of Systems  Objective: Vital Signs: Ht 6' (1.829 m)   Wt (!) 207 lb (93.9 kg)   BMI 28.07 kg/m   Specialty Comments:  No specialty comments available.  PMFS History: Patient Active Problem List   Diagnosis Date Noted  . Left ankle pain 03/26/2020  . Charcot's joint, left ankle and foot    Past Medical History:  Diagnosis Date  . Arthritis   . Cataract   . Charcot's arthropathy    left  . Hypertension     History reviewed. No pertinent family history.  Past Surgical History:  Procedure Laterality Date  . ANKLE FUSION Left 03/26/2020   Procedure: LEFT TIBIOCALCANEAL FUSION;  Surgeon: Nadara Mustard, MD;  Location: [facility] OR;  Service: Orthopedics;  Laterality: Left;  . CATARACT EXTRACTION    . HERNIA REPAIR    . KNEE SURGERY  Social History   Occupational History  . Not on file  Tobacco Use  . Smoking status: Current Some Day Smoker    Types: Cigars  . Smokeless tobacco: Never Used  Vaping Use  . Vaping Use: Never used  Substance and Sexual Activity  . Alcohol use: Yes    Comment: social  . Drug use: No  . Sexual activity: Not on file

## 2020-05-07 ENCOUNTER — Encounter: Payer: Self-pay | Admitting: Orthopedic Surgery

## 2020-05-11 ENCOUNTER — Encounter: Payer: Self-pay | Admitting: Physician Assistant

## 2020-05-11 ENCOUNTER — Other Ambulatory Visit (HOSPITAL_COMMUNITY)
Admission: RE | Admit: 2020-05-11 | Discharge: 2020-05-11 | Disposition: A | Discharge status: Home / Self Care | Payer: HRSA Program | Source: Ambulatory Visit | Attending: Orthopedic Surgery | Admitting: Orthopedic Surgery

## 2020-05-11 ENCOUNTER — Encounter (HOSPITAL_COMMUNITY): Payer: Self-pay | Admitting: Orthopedic Surgery

## 2020-05-11 ENCOUNTER — Other Ambulatory Visit: Payer: Self-pay

## 2020-05-11 ENCOUNTER — Ambulatory Visit (INDEPENDENT_AMBULATORY_CARE_PROVIDER_SITE_OTHER): Payer: Self-pay

## 2020-05-11 ENCOUNTER — Ambulatory Visit (INDEPENDENT_AMBULATORY_CARE_PROVIDER_SITE_OTHER): Payer: Self-pay | Admitting: Orthopedic Surgery

## 2020-05-11 ENCOUNTER — Other Ambulatory Visit: Payer: Self-pay | Admitting: Physician Assistant

## 2020-05-11 VITALS — Ht 72.0 in | Wt 207.0 lb

## 2020-05-11 DIAGNOSIS — Z01812 Encounter for preprocedural laboratory examination: Secondary | ICD-10-CM | POA: Diagnosis present

## 2020-05-11 DIAGNOSIS — Z20822 Contact with and (suspected) exposure to covid-19: Secondary | ICD-10-CM | POA: Diagnosis not present

## 2020-05-11 DIAGNOSIS — M14672 Charcot's joint, left ankle and foot: Secondary | ICD-10-CM

## 2020-05-11 DIAGNOSIS — T847XXA Infection and inflammatory reaction due to other internal orthopedic prosthetic devices, implants and grafts, initial encounter: Secondary | ICD-10-CM

## 2020-05-11 DIAGNOSIS — L02419 Cutaneous abscess of limb, unspecified: Secondary | ICD-10-CM

## 2020-05-11 LAB — SARS CORONAVIRUS 2 (TAT 6-24 HRS): SARS Coronavirus 2: NEGATIVE

## 2020-05-11 MED ORDER — DOXYCYCLINE HYCLATE 100 MG PO TABS: 100.0000 mg | ORAL_TABLET | Freq: Two times a day (BID) | ORAL | 0 refills | Status: DC

## 2020-05-11 NOTE — Progress Notes (Signed)
Office Visit Note   Patient: Travis Mayo           Date of Birth: 04-Feb-1961           MRN: [id number] Visit Date: 05/11/2020              Requested by: Felix Pacini, FNP [address] [place],  [place] [address] PCP: Felix Pacini, FNP  Chief Complaint  Patient presents with  . Left Ankle - Routine Post Op     Left tibiocalcaneal fusion 03/26/2020      HPI: Patient is a 59 year old gentleman who presents 6 weeks status post left tibial calcaneal fusion.  Patient complains of acute pain swelling and drainage this weekend.  Assessment & Plan: Visit Diagnoses:  1. Charcot's joint, left ankle and foot   2. Abscess of ankle   3. Hardware complicating wound infection, initial encounter [facility])     Plan: Patient has a deep abscess over the hardware with exposed anterior tibial tendon and necrotic skin breakdown  Will plan for excision of the necrotic tissue debridement of the abscess removal of the deep retained hardware placement of a installation wound VAC placement of a PICC line and initiation of 6 weeks of IV antibiotics.  Follow-Up Instructions: Return in about 1 week (around 05/18/2020).   Ortho Exam  Patient is alert, oriented, no adenopathy, well-dressed, normal affect, normal respiratory effort. Examination patient has acute wound dehiscence with exposed anterior tibial tendon there is purulent drainage odor warmth and tenderness to palpation.  Patient states this all started this weekend.  Imaging: XR Ankle Complete Left  Result Date: 05/11/2020 Three-view radiographs of the left ankle shows stable tibial calcaneal fusion no hardware lucency.  No images are attached to the encounter.  Labs: Lab Results  Component Value Date   ESRSEDRATE 29 (H) 03/10/2020   CRP 1.2 (H) 03/10/2020     Lab Results  Component Value Date   ALBUMIN 3.8 03/10/2020   ALBUMIN 4.1 10/07/2018    No results found for: MG No results found for: VD25OH  No results  found for: PREALBUMIN CBC EXTENDED Latest Ref Rng & Units 03/26/2020 03/10/2020 10/07/2018  WBC 4.0 - 10.5 K/uL 10.4 11.4(H) 10.4  RBC 4.22 - 5.81 MIL/uL 4.20(L) 4.07(L) 4.98  HGB 13.0 - 17.0 g/dL 12.7(L) 12.6(L) 15.0  HCT 39 - 52 % 39.1 39.1 45.6  PLT 150 - 400 K/uL 458(H) 366 333  NEUTROABS 1.7 - 7.7 K/uL - 8.8(H) 8.5(H)  LYMPHSABS 0.7 - 4.0 K/uL - 1.5 1.0     Body mass index is 28.07 kg/m.  Orders:  Orders Placed This Encounter  Procedures  . XR Ankle Complete Left   Meds ordered this encounter  Medications  . doxycycline (VIBRA-TABS) 100 MG tablet    Sig: Take 1 tablet (100 mg total) by mouth 2 (two) times daily.    Dispense:  60 tablet    Refill:  0     Procedures: No procedures performed  Clinical Data: No additional findings.  ROS:  All other systems negative, except as noted in the HPI. Review of Systems  Objective: Vital Signs: Ht 6' (1.829 m)   Wt 207 lb (93.9 kg)   BMI 28.07 kg/m   Specialty Comments:  No specialty comments available.  PMFS History: Patient Active Problem List   Diagnosis Date Noted  . Left ankle pain 03/26/2020  . Charcot's joint, left ankle and foot    Past Medical History:  Diagnosis Date  .  Arthritis   . Cataract   . Charcot's arthropathy    left  . Hypertension     History reviewed. No pertinent family history.  Past Surgical History:  Procedure Laterality Date  . ANKLE FUSION Left 03/26/2020   Procedure: LEFT TIBIOCALCANEAL FUSION;  Surgeon: Nadara Mustard, MD;  Location: [facility] OR;  Service: Orthopedics;  Laterality: Left;  . CATARACT EXTRACTION    . HERNIA REPAIR    . KNEE SURGERY     Social History   Occupational History  . Not on file  Tobacco Use  . Smoking status: Current Some Day Smoker    Types: Cigars  . Smokeless tobacco: Never Used  Vaping Use  . Vaping Use: Never used  Substance and Sexual Activity  . Alcohol use: Yes    Comment: social  . Drug use: No  . Sexual activity: Not on file

## 2020-05-11 NOTE — Progress Notes (Addendum)
Travis Mayo denies chest pain or shortness of breath.  Patient was tested for Covid and is in quarantine.  Travis Mayo has a history of HTN, patient reports that he is out of Lisinopril- he is changing PCP's, does not have one at this time. Travis Mayo reports that he thinks  His blood pressure was high due to pain; I asked if it is a recent prescription, he said no. I instructed patient on ERAS, patient said he is not coming to pick up Pre- Surgery Ensure.   I called Dr. Audrie Lia office t see if patient needs to stop Ibuprofen, I spoke with Eunice Blase, she spoke to Dr. Lajoyce Corners, Dr Lajoyce Corners said patient can continue to take.  I notified Travis Mayo.

## 2020-05-12 ENCOUNTER — Encounter (HOSPITAL_COMMUNITY)
Admission: RE | Disposition: A | Discharge status: Home Health | Payer: Self-pay | Source: Home / Self Care | Attending: Orthopedic Surgery

## 2020-05-12 ENCOUNTER — Inpatient Hospital Stay (HOSPITAL_COMMUNITY): Payer: Self-pay | Admitting: Anesthesiology

## 2020-05-12 ENCOUNTER — Encounter (HOSPITAL_COMMUNITY): Payer: Self-pay | Admitting: Orthopedic Surgery

## 2020-05-12 ENCOUNTER — Inpatient Hospital Stay (HOSPITAL_COMMUNITY)
Admission: RE | Admit: 2020-05-12 | Discharge: 2020-05-22 | DRG: 857 | Disposition: A | Discharge status: Home Health | Payer: Self-pay | Attending: Orthopedic Surgery | Admitting: Orthopedic Surgery

## 2020-05-12 ENCOUNTER — Other Ambulatory Visit: Payer: Self-pay

## 2020-05-12 DIAGNOSIS — F17290 Nicotine dependence, other tobacco product, uncomplicated: Secondary | ICD-10-CM | POA: Diagnosis present

## 2020-05-12 DIAGNOSIS — Y838 Other surgical procedures as the cause of abnormal reaction of the patient, or of later complication, without mention of misadventure at the time of the procedure: Secondary | ICD-10-CM | POA: Diagnosis present

## 2020-05-12 DIAGNOSIS — M71072 Abscess of bursa, left ankle and foot: Secondary | ICD-10-CM | POA: Diagnosis present

## 2020-05-12 DIAGNOSIS — L02416 Cutaneous abscess of left lower limb: Secondary | ICD-10-CM | POA: Diagnosis present

## 2020-05-12 DIAGNOSIS — B9561 Methicillin susceptible Staphylococcus aureus infection as the cause of diseases classified elsewhere: Secondary | ICD-10-CM | POA: Diagnosis present

## 2020-05-12 DIAGNOSIS — A4901 Methicillin susceptible Staphylococcus aureus infection, unspecified site: Secondary | ICD-10-CM

## 2020-05-12 DIAGNOSIS — I340 Nonrheumatic mitral (valve) insufficiency: Secondary | ICD-10-CM

## 2020-05-12 DIAGNOSIS — M1990 Unspecified osteoarthritis, unspecified site: Secondary | ICD-10-CM | POA: Diagnosis present

## 2020-05-12 DIAGNOSIS — I10 Essential (primary) hypertension: Secondary | ICD-10-CM | POA: Diagnosis present

## 2020-05-12 DIAGNOSIS — M86672 Other chronic osteomyelitis, left ankle and foot: Secondary | ICD-10-CM | POA: Diagnosis present

## 2020-05-12 DIAGNOSIS — H269 Unspecified cataract: Secondary | ICD-10-CM | POA: Diagnosis present

## 2020-05-12 DIAGNOSIS — I059 Rheumatic mitral valve disease, unspecified: Secondary | ICD-10-CM | POA: Diagnosis present

## 2020-05-12 DIAGNOSIS — Z981 Arthrodesis status: Secondary | ICD-10-CM

## 2020-05-12 DIAGNOSIS — M14672 Charcot's joint, left ankle and foot: Secondary | ICD-10-CM | POA: Diagnosis present

## 2020-05-12 DIAGNOSIS — T8142XA Infection following a procedure, deep incisional surgical site, initial encounter: Principal | ICD-10-CM | POA: Diagnosis present

## 2020-05-12 DIAGNOSIS — T847XXA Infection and inflammatory reaction due to other internal orthopedic prosthetic devices, implants and grafts, initial encounter: Secondary | ICD-10-CM

## 2020-05-12 HISTORY — PX: I & D EXTREMITY: SHX5045

## 2020-05-12 LAB — COMPREHENSIVE METABOLIC PANEL
ALT: 15 U/L (ref 0–44)
AST: 21 U/L (ref 15–41)
Albumin: 3.1 g/dL — ABNORMAL LOW (ref 3.5–5.0)
Alkaline Phosphatase: 59 U/L (ref 38–126)
Anion gap: 15 (ref 5–15)
BUN: 10 mg/dL (ref 6–20)
CO2: 19 mmol/L — ABNORMAL LOW (ref 22–32)
Calcium: 9.8 mg/dL (ref 8.9–10.3)
Chloride: 102 mmol/L (ref 98–111)
Creatinine, Ser: 0.89 mg/dL (ref 0.61–1.24)
GFR calc Af Amer: >60 mL/min (ref >60)
GFR calc non Af Amer: >60 mL/min (ref >60)
Glucose, Bld: 91 mg/dL (ref 70–99)
Potassium: 3.3 mmol/L — ABNORMAL LOW (ref 3.5–5.1)
Sodium: 136 mmol/L (ref 135–145)
Total Bilirubin: 0.4 mg/dL (ref 0.3–1.2)
Total Protein: 6.3 g/dL — ABNORMAL LOW (ref 6.5–8.1)

## 2020-05-12 LAB — CBC
HCT: 40.3 % (ref 39.0–52.0)
Hemoglobin: 12.7 g/dL — ABNORMAL LOW (ref 13.0–17.0)
MCH: 28.8 pg (ref 26.0–34.0)
MCHC: 31.5 g/dL (ref 30.0–36.0)
MCV: 91.4 fL (ref 80.0–100.0)
Platelets: 469 10*3/uL — ABNORMAL HIGH (ref 150–400)
RBC: 4.41 MIL/uL (ref 4.22–5.81)
RDW: 13.7 % (ref 11.5–15.5)
WBC: 15.6 10*3/uL — ABNORMAL HIGH (ref 4.0–10.5)
nRBC: 0 % (ref 0.0–0.2)

## 2020-05-12 LAB — SURGICAL PCR SCREEN
MRSA, PCR: NEGATIVE
Staphylococcus aureus: POSITIVE — AB

## 2020-05-12 SURGERY — IRRIGATION AND DEBRIDEMENT EXTREMITY
Anesthesia: Regional | Site: Ankle | Laterality: Left

## 2020-05-12 MED ORDER — BUPIVACAINE HCL (PF) 0.5 % IJ SOLN
INTRAMUSCULAR | Status: DC | PRN
  Administered 2020-05-12: 10 mL via PERINEURAL
  Administered 2020-05-12: 20 mL via PERINEURAL

## 2020-05-12 MED ORDER — ONDANSETRON HCL 4 MG/2ML IJ SOLN
INTRAMUSCULAR | Status: DC | PRN
  Administered 2020-05-12: 4 mg via INTRAVENOUS

## 2020-05-12 MED ORDER — PROMETHAZINE HCL 25 MG/ML IJ SOLN: 6.2500 mg | INTRAMUSCULAR | Status: DC | PRN

## 2020-05-12 MED ORDER — ORAL CARE MOUTH RINSE: 15.0000 mL | Freq: Once | OROMUCOSAL | Status: AC

## 2020-05-12 MED ORDER — FENTANYL CITRATE (PF) 250 MCG/5ML IJ SOLN
INTRAMUSCULAR | Status: DC | PRN
  Administered 2020-05-12 (×5): 50 ug via INTRAVENOUS

## 2020-05-12 MED ORDER — MIDAZOLAM HCL 2 MG/2ML IJ SOLN: 2.0000 mg | Freq: Once | INTRAMUSCULAR | Status: AC

## 2020-05-12 MED ORDER — ONDANSETRON HCL 4 MG PO TABS: 4.0000 mg | ORAL_TABLET | Freq: Four times a day (QID) | ORAL | Status: DC | PRN

## 2020-05-12 MED ORDER — LIDOCAINE 2% (20 MG/ML) 5 ML SYRINGE
INTRAMUSCULAR | Status: DC | PRN
  Administered 2020-05-12: 60 mg via INTRAVENOUS

## 2020-05-12 MED ORDER — CHLORHEXIDINE GLUCONATE 0.12 % MT SOLN: 15.0000 mL | Freq: Once | OROMUCOSAL | Status: AC

## 2020-05-12 MED ORDER — OXYCODONE HCL 5 MG PO TABS
10.0000 mg | ORAL_TABLET | ORAL | Status: DC | PRN
  Administered 2020-05-13 (×3): 10 mg via ORAL
  Filled 2020-05-12: qty 2

## 2020-05-12 MED ORDER — HYDROMORPHONE HCL 1 MG/ML IJ SOLN: 0.2500 mg | INTRAMUSCULAR | Status: DC | PRN

## 2020-05-12 MED ORDER — HYDROMORPHONE HCL 1 MG/ML IJ SOLN
0.5000 mg | INTRAMUSCULAR | Status: DC | PRN
  Administered 2020-05-21: 1 mg via INTRAVENOUS
  Filled 2020-05-12: qty 1

## 2020-05-12 MED ORDER — METOCLOPRAMIDE HCL 5 MG PO TABS: 5.0000 mg | ORAL_TABLET | Freq: Three times a day (TID) | ORAL | Status: DC | PRN

## 2020-05-12 MED ORDER — FENTANYL CITRATE (PF) 100 MCG/2ML IJ SOLN
INTRAMUSCULAR | Status: AC
  Administered 2020-05-12: 50 ug via INTRAVENOUS
  Filled 2020-05-12: qty 2

## 2020-05-12 MED ORDER — FENTANYL CITRATE (PF) 100 MCG/2ML IJ SOLN: 50.0000 ug | Freq: Once | INTRAMUSCULAR | Status: AC

## 2020-05-12 MED ORDER — FENTANYL CITRATE (PF) 250 MCG/5ML IJ SOLN
INTRAMUSCULAR | Status: AC
  Filled 2020-05-12: qty 5

## 2020-05-12 MED ORDER — METOCLOPRAMIDE HCL 5 MG/ML IJ SOLN: 5.0000 mg | Freq: Three times a day (TID) | INTRAMUSCULAR | Status: DC | PRN

## 2020-05-12 MED ORDER — BUPIVACAINE LIPOSOME 1.3 % IJ SUSP
INTRAMUSCULAR | Status: DC | PRN
  Administered 2020-05-12: 10 mL via PERINEURAL

## 2020-05-12 MED ORDER — OXYCODONE HCL 5 MG PO TABS
5.0000 mg | ORAL_TABLET | ORAL | Status: DC | PRN
  Administered 2020-05-17: 10 mg via ORAL
  Filled 2020-05-12 (×3): qty 2

## 2020-05-12 MED ORDER — LACTATED RINGERS IV SOLN: INTRAVENOUS | Status: DC

## 2020-05-12 MED ORDER — DOCUSATE SODIUM 100 MG PO CAPS
100.0000 mg | ORAL_CAPSULE | Freq: Two times a day (BID) | ORAL | Status: DC
  Administered 2020-05-13 – 2020-05-17 (×8): 100 mg via ORAL
  Filled 2020-05-12 (×16): qty 1

## 2020-05-12 MED ORDER — ACETAMINOPHEN 325 MG PO TABS
325.0000 mg | ORAL_TABLET | Freq: Four times a day (QID) | ORAL | Status: DC | PRN
  Filled 2020-05-12: qty 2

## 2020-05-12 MED ORDER — CHLORHEXIDINE GLUCONATE 0.12 % MT SOLN
OROMUCOSAL | Status: AC
  Administered 2020-05-12: 15 mL via OROMUCOSAL
  Filled 2020-05-12: qty 15

## 2020-05-12 MED ORDER — MIDAZOLAM HCL 2 MG/2ML IJ SOLN
INTRAMUSCULAR | Status: DC | PRN
  Administered 2020-05-12: 2 mg via INTRAVENOUS

## 2020-05-12 MED ORDER — DEXAMETHASONE SODIUM PHOSPHATE 10 MG/ML IJ SOLN
INTRAMUSCULAR | Status: DC | PRN
  Administered 2020-05-12: 5 mg via INTRAVENOUS

## 2020-05-12 MED ORDER — OXYCODONE HCL 5 MG/5ML PO SOLN: 5.0000 mg | Freq: Once | ORAL | Status: DC | PRN

## 2020-05-12 MED ORDER — KETOROLAC TROMETHAMINE 30 MG/ML IJ SOLN: 30.0000 mg | Freq: Once | INTRAMUSCULAR | Status: DC | PRN

## 2020-05-12 MED ORDER — ACETAMINOPHEN 500 MG PO TABS
1000.0000 mg | ORAL_TABLET | Freq: Once | ORAL | Status: AC
  Administered 2020-05-12: 1000 mg via ORAL
  Filled 2020-05-12: qty 2

## 2020-05-12 MED ORDER — SODIUM CHLORIDE 0.9 % IV SOLN: INTRAVENOUS | Status: DC

## 2020-05-12 MED ORDER — OXYCODONE HCL 5 MG PO TABS: 5.0000 mg | ORAL_TABLET | Freq: Once | ORAL | Status: DC | PRN

## 2020-05-12 MED ORDER — PHENYLEPHRINE 40 MCG/ML (10ML) SYRINGE FOR IV PUSH (FOR BLOOD PRESSURE SUPPORT)
PREFILLED_SYRINGE | INTRAVENOUS | Status: DC | PRN
  Administered 2020-05-12 (×2): 80 ug via INTRAVENOUS

## 2020-05-12 MED ORDER — ONDANSETRON HCL 4 MG/2ML IJ SOLN: 4.0000 mg | Freq: Four times a day (QID) | INTRAMUSCULAR | Status: DC | PRN

## 2020-05-12 MED ORDER — MIDAZOLAM HCL 2 MG/2ML IJ SOLN
INTRAMUSCULAR | Status: AC
  Filled 2020-05-12: qty 2

## 2020-05-12 MED ORDER — 0.9 % SODIUM CHLORIDE (POUR BTL) OPTIME
TOPICAL | Status: DC | PRN
  Administered 2020-05-12: 1000 mL

## 2020-05-12 MED ORDER — CEFAZOLIN SODIUM-DEXTROSE 2-4 GM/100ML-% IV SOLN
2.0000 g | INTRAVENOUS | Status: AC
  Administered 2020-05-12: 2 g via INTRAVENOUS
  Filled 2020-05-12: qty 100

## 2020-05-12 MED ORDER — PROPOFOL 10 MG/ML IV BOLUS
INTRAVENOUS | Status: DC | PRN
  Administered 2020-05-12: 180 mg via INTRAVENOUS

## 2020-05-12 MED ORDER — MIDAZOLAM HCL 2 MG/2ML IJ SOLN
INTRAMUSCULAR | Status: AC
  Administered 2020-05-12: 2 mg via INTRAVENOUS
  Filled 2020-05-12: qty 2

## 2020-05-12 MED ORDER — ONDANSETRON HCL 4 MG/2ML IJ SOLN
INTRAMUSCULAR | Status: AC
  Filled 2020-05-12: qty 4

## 2020-05-12 SURGICAL SUPPLY — 39 items
BLADE SURG 21 STRL SS (BLADE) ×3 IMPLANT
BNDG COHESIVE 6X5 TAN STRL LF (GAUZE/BANDAGES/DRESSINGS) ×3 IMPLANT
BNDG GAUZE ELAST 4 BULKY (GAUZE/BANDAGES/DRESSINGS) IMPLANT
CANISTER WOUNDNEG PRESSURE 500 (CANNISTER) ×3 IMPLANT
CASSETTE VERAFLO VERALINK (MISCELLANEOUS) ×3 IMPLANT
CNTNR URN SCR LID CUP LEK RST (MISCELLANEOUS) ×1 IMPLANT
CONT SPEC 4OZ STRL OR WHT (MISCELLANEOUS) ×2
COVER SURGICAL LIGHT HANDLE (MISCELLANEOUS) ×3 IMPLANT
COVER WAND RF STERILE (DRAPES) IMPLANT
DRAPE U-SHAPE 47X51 STRL (DRAPES) ×3 IMPLANT
DRESSING VERAFLO CLEANSE CC (GAUZE/BANDAGES/DRESSINGS) ×1 IMPLANT
DRSG ADAPTIC 3X8 NADH LF (GAUZE/BANDAGES/DRESSINGS) IMPLANT
DRSG VERAFLO CLEANSE CC (GAUZE/BANDAGES/DRESSINGS) ×3
DURAPREP 26ML APPLICATOR (WOUND CARE) ×3 IMPLANT
ELECT REM PT RETURN 9FT ADLT (ELECTROSURGICAL) ×3
ELECTRODE REM PT RTRN 9FT ADLT (ELECTROSURGICAL) ×1 IMPLANT
GAUZE SPONGE 4X4 12PLY STRL (GAUZE/BANDAGES/DRESSINGS) IMPLANT
GLOVE BIOGEL PI IND STRL 9 (GLOVE) ×1 IMPLANT
GLOVE BIOGEL PI INDICATOR 9 (GLOVE) ×2
GLOVE SURG ORTHO 9.0 STRL STRW (GLOVE) ×3 IMPLANT
GOWN STRL REUS W/ TWL XL LVL3 (GOWN DISPOSABLE) ×2 IMPLANT
GOWN STRL REUS W/TWL XL LVL3 (GOWN DISPOSABLE) ×4
HANDPIECE INTERPULSE COAX TIP (DISPOSABLE)
KIT BASIN OR (CUSTOM PROCEDURE TRAY) ×3 IMPLANT
KIT TURNOVER KIT B (KITS) ×3 IMPLANT
MANIFOLD NEPTUNE II (INSTRUMENTS) IMPLANT
NS IRRIG 1000ML POUR BTL (IV SOLUTION) ×3 IMPLANT
PACK ORTHO EXTREMITY (CUSTOM PROCEDURE TRAY) ×3 IMPLANT
PAD ARMBOARD 7.5X6 YLW CONV (MISCELLANEOUS) IMPLANT
SET HNDPC FAN SPRY TIP SCT (DISPOSABLE) IMPLANT
SPONGE LAP 18X18 RF (DISPOSABLE) ×3 IMPLANT
STOCKINETTE IMPERVIOUS 9X36 MD (GAUZE/BANDAGES/DRESSINGS) ×3 IMPLANT
SUT ETHILON 2 0 PSLX (SUTURE) ×15 IMPLANT
SWAB COLLECTION DEVICE MRSA (MISCELLANEOUS) IMPLANT
SWAB CULTURE ESWAB REG 1ML (MISCELLANEOUS) IMPLANT
TOWEL GREEN STERILE (TOWEL DISPOSABLE) ×3 IMPLANT
TUBE CONNECTING 12'X1/4 (SUCTIONS) ×1
TUBE CONNECTING 12X1/4 (SUCTIONS) ×2 IMPLANT
YANKAUER SUCT BULB TIP NO VENT (SUCTIONS) ×3 IMPLANT

## 2020-05-12 NOTE — Anesthesia Procedure Notes (Signed)
Anesthesia Regional Block: Popliteal block   Pre-Anesthetic Checklist: ,, timeout performed, Correct Patient, Correct Site, Correct Laterality, Correct Procedure, Correct Position, site marked, Risks and benefits discussed,  Surgical consent,  Pre-op evaluation,  At surgeon's request and post-op pain management  Laterality: Left  Prep: chloraprep       Needles:  Injection technique: Single-shot  Needle Type: Echogenic Stimulator Needle     Needle Length: 9cm  Needle Gauge: 21     Additional Needles:   Procedures:,,,, ultrasound used (permanent image in chart),,,,  Narrative:  Start time: 05/12/2020 5:15 PM End time: 05/12/2020 5:20 PM Injection made incrementally with aspirations every 5 mL.  Performed by: Personally  Anesthesiologist: Shelton Silvas, MD  Additional Notes: Patient tolerated the procedure well. Local anesthetic introduced in an incremental fashion under minimal resistance after negative aspirations. No paresthesias were elicited. After completion of the procedure, no acute issues were identified and patient continued to be monitored by RN.

## 2020-05-12 NOTE — Anesthesia Procedure Notes (Signed)
Anesthesia Regional Block: Adductor canal block   Pre-Anesthetic Checklist: ,, timeout performed, Correct Patient, Correct Site, Correct Laterality, Correct Procedure, Correct Position, site marked, Risks and benefits discussed,  Surgical consent,  Pre-op evaluation,  At surgeon's request and post-op pain management  Laterality: Left  Prep: chloraprep       Needles:  Injection technique: Single-shot  Needle Type: Echogenic Stimulator Needle     Needle Length: 9cm  Needle Gauge: 21     Additional Needles:   Procedures:,,,, ultrasound used (permanent image in chart),,,,  Narrative:  Start time: 05/12/2020 5:20 PM End time: 05/12/2020 5:25 PM Injection made incrementally with aspirations every 5 mL.  Performed by: Personally  Anesthesiologist: Shelton Silvas, MD  Additional Notes: Patient tolerated the procedure well. Local anesthetic introduced in an incremental fashion under minimal resistance after negative aspirations. No paresthesias were elicited. After completion of the procedure, no acute issues were identified and patient continued to be monitored by RN.

## 2020-05-12 NOTE — Op Note (Signed)
05/12/2020  8:35 PM  PATIENT:  Travis Mayo    PRE-OPERATIVE DIAGNOSIS:  infected left ankle fusion  POST-OPERATIVE DIAGNOSIS:  Same  PROCEDURE:  DEBRIDEMENT LEFT ANKLE & REMOVAL DEEP HARDWARE Placement of installation wound VAC  SURGEON:  Nadara Mustard, MD  PHYSICIAN ASSISTANT:None ANESTHESIA:   General  PREOPERATIVE INDICATIONS:  Travis Mayo is a  59 y.o. male with a diagnosis of infected left ankle fusion who failed conservative measures and elected for surgical management.    The risks benefits and alternatives were discussed with the patient preoperatively including but not limited to the risks of infection, bleeding, nerve injury, cardiopulmonary complications, the need for revision surgery, among others, and the patient was willing to proceed.  OPERATIVE IMPLANTS: Installation wound VAC sponges x2  @ENCIMAGES @  OPERATIVE FINDINGS: Infection around the hardware no purulence no necrotic tissue.  Tissue and fluid sent for cultures.  OPERATIVE PROCEDURE: Patient was brought to the operating room and underwent a general anesthetic.  After adequate levels anesthesia were obtained the left lower extremity was prepped using DuraPrep draped into a sterile field a timeout was called.  A elliptical incision was made around the previous incision there was exposed necrotic anterior tibial tendon as well as wound blistering this was excised in 1 block of tissue and sent for cultures.  The incision was carried down through the anterior tibial tendon that was infected.  This was primarily resected.  The hardware was then removed with all screws and plates removed.  The wound was irrigated with normal saline all tissue that appeared to be involved with the infected area were resected.  This was all sent for cultures.  The wound was irrigated with normal saline.  The wound was then packed open with the reticulated and solid cleanse choice foam.  The wound was then partially closed over the foam  dressings to prevent retraction of the skin.  This was then covered with Covan this had a good suction fit this was set for 22 cc of installation 10-minute dwell time and 2 hours of suction.  This was overwrapped with Coban.  Patient was extubated taken the PACU in stable condition.   DISCHARGE PLANNING:  Antibiotic duration: Start vancomycin and Zosyn and adjust according to cultures.  Weightbearing: Nonweightbearing on the left  Pain medication: Opioid pathway  Dressing care/ Wound VAC: Start instillation therapy and continue.  Ambulatory devices: Walker or crutches  Discharge to: Anticipate admission will have a PICC line placed and infectious disease consulted.  Patient will need at least 6 weeks of IV antibiotics.  Follow-up: In the office 1 week post operative.

## 2020-05-12 NOTE — Anesthesia Preprocedure Evaluation (Addendum)
Anesthesia Evaluation  Patient identified by MRN, date of birth, ID band Patient awake    Reviewed: Allergy & Precautions, NPO status , Patient's Chart, lab work & pertinent test results  Airway Mallampati: II  TM Distance: >3 FB Neck ROM: Full    Dental no notable dental hx. (+) Teeth Intact, Dental Advisory Given   Pulmonary Current Smoker,  Cigars socially    Pulmonary exam normal breath sounds clear to auscultation       Cardiovascular hypertension, Normal cardiovascular exam Rhythm:Regular Rate:Normal  Uncontrolled HTN- stopped taking lisinopril    Neuro/Psych negative neurological ROS  negative psych ROS   GI/Hepatic negative GI ROS, (+)     substance abuse  alcohol use,   Endo/Other  negative endocrine ROS  Renal/GU negative Renal ROS  negative genitourinary   Musculoskeletal  (+) Arthritis , Infected left ankle fusion   Abdominal Normal abdominal exam  (+)   Peds  Hematology negative hematology ROS (+)   Anesthesia Other Findings   Reproductive/Obstetrics negative OB ROS                          Anesthesia Physical Anesthesia Plan  ASA: II  Anesthesia Plan: General and Regional   Post-op Pain Management: GA combined w/ Regional for post-op pain   Induction: Intravenous  PONV Risk Score and Plan: 1 and Ondansetron, Dexamethasone, Treatment may vary due to age or medical condition and Midazolam  Airway Management Planned: LMA  Additional Equipment: None  Intra-op Plan:   Post-operative Plan: Extubation in OR  Informed Consent: I have reviewed the patients History and Physical, chart, labs and discussed the procedure including the risks, benefits and alternatives for the proposed anesthesia with the patient or authorized representative who has indicated his/her understanding and acceptance.     Dental advisory given  Plan Discussed with: CRNA  Anesthesia Plan  Comments: (Very poorly controlled HTN- will proceed as his ankle is infected, advised the patient that he has much higher chance of morbidity and mortality due to uncontrolled HTN.)       Anesthesia Quick Evaluation

## 2020-05-12 NOTE — Transfer of Care (Signed)
Immediate Anesthesia Transfer of Care Note  Patient: Travis Mayo  Procedure(s) Performed: DEBRIDEMENT LEFT ANKLE & REMOVAL DEEP HARDWARE (Left Ankle)  Patient Location: PACU  Anesthesia Type:General, Regional  Level of Consciousness: awake, alert , oriented, patient cooperative and responds to stimulation  Airway & Oxygen Therapy: Patient Spontanous Breathing  Post-op Assessment: Report given to RN and Post -op Vital signs reviewed and stable  Post vital signs: Reviewed and stable  Last Vitals:  Vitals Value Taken Time  BP 135/88 05/12/20 2035  Temp    Pulse 108 05/12/20 2038  Resp 14 05/12/20 2038  SpO2 99 % 05/12/20 2038  Vitals shown include unvalidated device data.  Last Pain:  Vitals:   05/12/20 1537  TempSrc:   PainSc: 4       Patients Stated Pain Goal: 3 (05/12/20 1537)  Complications: No complications documented.

## 2020-05-12 NOTE — H&P (Addendum)
Travis Mayo is an 59 y.o. male.   Chief Complaint: Left Ankle Abscess HPI: Patient is a 59 year old gentleman who presents 6 weeks status post left tibial calcaneal fusion.  Patient complains of acute pain swelling and drainage this weekend.  Past Medical History:  Diagnosis Date  . Arthritis   . Cataract   . Charcot's arthropathy    left  . Hypertension     Past Surgical History:  Procedure Laterality Date  . ANKLE FUSION Left 03/26/2020   Procedure: LEFT TIBIOCALCANEAL FUSION;  Surgeon: Nadara Mustard, MD;  Location: [facility] OR;  Service: Orthopedics;  Laterality: Left;  . CATARACT EXTRACTION Left   . HERNIA REPAIR Right   . KNEE SURGERY Right    cartilage    No family history on file. Social History:  reports that he has been smoking cigars. He has never used smokeless tobacco. He reports current alcohol use of about 9.0 standard drinks of alcohol per week. He reports that he does not use drugs.  Allergies: No Known Allergies  No medications prior to admission.    Results for orders placed or performed during the hospital encounter of 05/11/20 (from the past 48 hour(s))  SARS CORONAVIRUS 2 (TAT 6-24 HRS) Nasopharyngeal Nasopharyngeal Swab     Status: None   Collection Time: 05/11/20 10:33 AM   Specimen: Nasopharyngeal Swab  Result Value Ref Range   SARS Coronavirus 2 NEGATIVE NEGATIVE    Comment: (NOTE) SARS-CoV-2 target nucleic acids are NOT DETECTED.  The SARS-CoV-2 RNA is generally detectable in upper and lower respiratory specimens during the acute phase of infection. Negative results do not preclude SARS-CoV-2 infection, do not rule out co-infections with other pathogens, and should not be used as the sole basis for treatment or other patient management decisions. Negative results must be combined with clinical observations, patient history, and epidemiological information. The expected result is Negative.  Fact Sheet for  Patients: HairSlick.no  Fact Sheet for Healthcare Providers: quierodirigir.com  This test is not yet approved or cleared by the [place] FDA and  has been authorized for detection and/or diagnosis of SARS-CoV-2 by FDA under an Emergency Use Authorization (EUA). This EUA will remain  in effect (meaning this test can be used) for the duration of the COVID-19 declaration under Se ction 564(b)(1) of the Act, 21 U.S.C. section 360bbb-3(b)(1), unless the authorization is terminated or revoked sooner.  Performed at [facility], [address], [place], [place] [address]    XR Ankle Complete Left  Result Date: 05/11/2020 Three-view radiographs of the left ankle shows stable tibial calcaneal fusion no hardware lucency.   Review of Systems  All other systems reviewed and are negative.   There were no vitals taken for this visit. Physical Exam  Patient is alert, oriented, no adenopathy, well-dressed, normal affect, normal respiratory effort. Examination patient has acute wound dehiscence with exposed anterior tibial tendon there is purulent drainage odor warmth and tenderness to palpation.  Patient states this all started this weekend. Assessment/Plan 1. Charcot's joint, left ankle and foot   2. Abscess of ankle   3. Hardware complicating wound infection, initial encounter [facility])     Plan: Patient has a deep abscess over the hardware with exposed anterior tibial tendon and necrotic skin breakdown  Will plan for excision of the necrotic tissue debridement of the abscess removal of the deep retained hardware placement of a installation wound VAC placement of a PICC line and initiation of 6 weeks of IV antibiotics.  [place] Persons, [place] 05/12/2020, 6:42 AM   Plan for repeat debridement left ankle , skin graft, VAC placement.

## 2020-05-12 NOTE — Anesthesia Postprocedure Evaluation (Signed)
Anesthesia Post Note  Patient: Travis Mayo  Procedure(s) Performed: DEBRIDEMENT LEFT ANKLE & REMOVAL DEEP HARDWARE (Left Ankle)     Patient location during evaluation: PACU Anesthesia Type: General Level of consciousness: awake and alert Pain management: pain level controlled Vital Signs Assessment: post-procedure vital signs reviewed and stable Respiratory status: spontaneous breathing, nonlabored ventilation and respiratory function stable Cardiovascular status: blood pressure returned to baseline and stable Postop Assessment: no apparent nausea or vomiting Anesthetic complications: no   No complications documented.  Last Vitals:  Vitals:   05/12/20 2100 05/12/20 2130  BP: 119/88 130/80  Pulse: 100 100  Resp: 17 14  Temp:    SpO2: 99% 100%    Last Pain:  Vitals:   05/12/20 2155  TempSrc:   PainSc: 0-No pain                 Beryle Lathe

## 2020-05-13 ENCOUNTER — Encounter (HOSPITAL_COMMUNITY): Payer: Self-pay | Admitting: Orthopedic Surgery

## 2020-05-13 MED ORDER — VANCOMYCIN HCL 1250 MG/250ML IV SOLN
1250.0000 mg | Freq: Two times a day (BID) | INTRAVENOUS | Status: DC
  Administered 2020-05-13 – 2020-05-15 (×6): 1250 mg via INTRAVENOUS
  Filled 2020-05-13 (×6): qty 250

## 2020-05-13 MED ORDER — SODIUM CHLORIDE 0.9 % IV SOLN
2.0000 g | INTRAVENOUS | Status: DC
  Administered 2020-05-13 – 2020-05-14 (×2): 2 g via INTRAVENOUS
  Filled 2020-05-13 (×2): qty 20

## 2020-05-13 MED ORDER — PIPERACILLIN-TAZOBACTAM 3.375 G IVPB
3.3750 g | Freq: Three times a day (TID) | INTRAVENOUS | Status: DC
  Administered 2020-05-13 (×2): 3.375 g via INTRAVENOUS
  Filled 2020-05-13 (×2): qty 50

## 2020-05-13 NOTE — Progress Notes (Signed)
Pharmacy Antibiotic Note  Travis Mayo is a 59 y.o. male admitted on 05/12/2020 with infected left ankle fusion.  Pharmacy has been consulted for Vancomycin/Zosyn dosing. WBC 15.6. Renal function good. Pt s/p OR 8/11 PM.  Plan: Vancomycin 1250 mg IV q12h Zosyn 3.375G IV q8h to be infused over 4 hours Trend WBC, temp, renal function  F/U infectious work-up Drug levels as indicated   Height: 6' (182.9 cm) Weight: 95.3 kg (210 lb) IBW/kg (Calculated) : 77.6  Temp (24hrs), Avg:97.6 F (36.4 C), Min:97.4 F (36.3 C), Max:98 F (36.7 C)  Recent Labs  Lab 05/12/20 1556  WBC 15.6*  CREATININE 0.89    Estimated Creatinine Clearance: 108.4 mL/min (by C-G formula based on SCr of 0.89 mg/dL).    No Known Allergies  Abran Duke, PharmD, BCPS Clinical Pharmacist Phone: [phone]

## 2020-05-13 NOTE — Progress Notes (Signed)
Notified on call physician that wound vac is malfunctioning ,orders were to leave it on and Dr Lajoyce Corners will take a look at it in the morning

## 2020-05-13 NOTE — Consult Note (Addendum)
WOC Nurse Consult Note: Patient receiving care in [facility]. Reason for Consult: "Patient has wound vac on ankle. Will need outer wrap changed on Monday 8/16.  May leave sponges in place" Wound type: Pressure Injury POA: Yes/No/NA Measurement: Wound bed: Drainage (amount, consistency, odor)  Periwound: Dressing procedure/placement/frequency: I have placed the patient and reason for consult on the WOC follow up worklist for Monday. Helmut Muster, RN, MSN, CWOCN, CNS-BC, pager [phone]

## 2020-05-13 NOTE — Progress Notes (Signed)
Orthopedic Tech Progress Note Patient Details:  Travis Mayo Apr 28, 1961 [id number] Patient already had a CAM Walker in their room.  Patient ID: Kemond Amorin, male   DOB: 1961/04/10, 59 y.o.   MRN: [id number]   Smitty Pluck 05/13/2020, 6:08 AM

## 2020-05-13 NOTE — Evaluation (Signed)
Occupational Therapy Evaluation Patient Details Name: Travis Mayo MRN: [id number] DOB: May 29, 1961 Today's Date: 05/13/2020    History of Present Illness Travis Mayo is a 59 y.o. male admitted on 05/12/2020 with infected left ankle fusion. PMH includes but not limited to cataracts extraction, arthritis, and HTN.    Clinical Impression   PTA, pt was living at home alone with friends to assist as needed, pt reports he was independent with ADL/IADL and modified independent with functional mobility with use of crutches. Pt reports inability to navigate stairs at home, but he was able to live on the main level. Pt currently requires minguard for LB dressing, grooming while standing and functional mobility at crutch level. Pt demonstrates mild instability during ADL and functional mobility. He would benefit from continued acute OT services to maximize independence with ADL/IADL and functional mobility for safe d/c home. Do not anticipate pt will need follow-up OT services.     Follow Up Recommendations  No OT follow up    Equipment Recommendations  None recommended by OT    Recommendations for Other Services       Precautions / Restrictions Precautions Precautions: Fall Precaution Comments: wound vac Restrictions Weight Bearing Restrictions: Yes Other Position/Activity Restrictions: NWB      Mobility Bed Mobility Overal bed mobility: Independent                Transfers Overall transfer level: Needs assistance Equipment used: Crutches Transfers: Sit to/from Stand Sit to Stand: Min guard         General transfer comment: minguard for safety and line management    Balance Overall balance assessment: Needs assistance Sitting-balance support: No upper extremity supported;Feet supported Sitting balance-Leahy Scale: Normal     Standing balance support: Single extremity supported;During functional activity Standing balance-Leahy Scale: Poor Standing balance comment:  reliant on at least single UE support in standing to maintain balance to complete grooming                           ADL either performed or assessed with clinical judgement   ADL Overall ADL's : Needs assistance/impaired Eating/Feeding: Independent;Sitting   Grooming: Set up;Sitting   Upper Body Bathing: Set up;Sitting   Lower Body Bathing: Min guard;Sit to/from stand   Upper Body Dressing : Independent;Sitting   Lower Body Dressing: Min guard;Sit to/from stand   Toilet Transfer: Min guard;Ambulation (crutches) Toilet Transfer Details (indicate cue type and reason): minguard for safety, ambulated around room Toileting- Clothing Manipulation and Hygiene: Min guard;Sit to/from stand       Functional mobility during ADLs: Min guard (crutches) General ADL Comments: pt with good adherence to NWB LLE during ADL and functioanl mobility with use of crutches     Vision Patient Visual Report: No change from baseline       Perception     Praxis      Pertinent Vitals/Pain Pain Assessment: 0-10 Pain Score: 4  Pain Location: LLE Pain Descriptors / Indicators: Constant;Sore Pain Intervention(s): Limited activity within patient's tolerance;Monitored during session     Hand Dominance Right   Extremity/Trunk Assessment Upper Extremity Assessment Upper Extremity Assessment: Overall WFL for tasks assessed   Lower Extremity Assessment Lower Extremity Assessment: Defer to PT evaluation;LLE deficits/detail LLE Deficits / Details: wound vac, pt reports decreased sensation, able to wiggle toes   Cervical / Trunk Assessment Cervical / Trunk Assessment: Normal   Communication Communication Communication: No difficulties   Cognition Arousal/Alertness: Awake/alert Behavior During  Therapy: WFL for tasks assessed/performed Overall Cognitive Status: Within Functional Limits for tasks assessed                                     General Comments  vss     Exercises     Shoulder Instructions      Home Living Family/patient expects to be discharged to:: Private residence Living Arrangements: Alone Available Help at Discharge: Friend(s);Available PRN/intermittently Type of Home: House Home Access: Stairs to enter [employer] of Steps: 3   Home Layout: Able to live on main level with bedroom/bathroom     Bathroom Shower/Tub: Chief Strategy Officer: Standard Bathroom Accessibility: Yes How Accessible: Accessible via walker Home Equipment: Crutches          Prior Functioning/Environment Level of Independence: Independent with assistive device(s)        Comments: independent with ADL, groceries delivered, driving, was washing up at the sink as shower is upstairs, pt unable to go to second floor for past 7 weeks, no falls        OT Problem List: Decreased activity tolerance;Impaired balance (sitting and/or standing);Decreased knowledge of precautions;Pain      OT Treatment/Interventions: Self-care/ADL training;Therapeutic exercise;Patient/family education;Balance training    OT Goals(Current goals can be found in the care plan section) Acute Rehab OT Goals Patient Stated Goal: for LLE to heal properly OT Goal Formulation: With patient Time For Goal Achievement: 05/27/20 Potential to Achieve Goals: Good ADL Goals Pt Will Perform Lower Body Dressing: Independently;sit to/from stand Pt Will Transfer to Toilet: Independently;ambulating Pt Will Perform Tub/Shower Transfer: Tub transfer;ambulating  OT Frequency: Min 2X/week   Barriers to D/C:            Co-evaluation              AM-PAC OT "6 Clicks" Daily Activity     Outcome Measure Help from another person eating meals?: None Help from another person taking care of personal grooming?: A Little Help from another person toileting, which includes using toliet, bedpan, or urinal?: A Little Help from another person bathing (including washing,  rinsing, drying)?: A Little Help from another person to put on and taking off regular upper body clothing?: A Little Help from another person to put on and taking off regular lower body clothing?: A Little 6 Click Score: 19   End of Session Equipment Utilized During Treatment: Gait belt (crutches) Nurse Communication: Mobility status  Activity Tolerance: Patient tolerated treatment well Patient left: in chair;with call bell/phone within reach (pt verbalized he would call for assistance)  OT Visit Diagnosis: Other abnormalities of gait and mobility (R26.89);Muscle weakness (generalized) (M62.81);Pain Pain - Right/Left: Left Pain - part of body: Leg                Time: 1030-1055 OT Time Calculation (min): 25 min Charges:  OT General Charges $OT Visit: 1 Visit OT Evaluation $OT Eval Moderate Complexity: 1 Mod OT Treatments $Self Care/Home Management : 8-22 mins  Rosey Bath OTR/L Acute Rehabilitation Services Office: [phone]   Rebeca Alert 05/13/2020, 11:34 AM

## 2020-05-13 NOTE — Evaluation (Signed)
Physical Therapy Evaluation Patient Details Name: Travis Mayo MRN: [id number] DOB: 24-Feb-1961 Today's Date: 05/13/2020   History of Present Illness  Pt is 59 yo male who is 6 weeks s/p L tibial calcaneal fusion who presented with acute pain, swelling, and drainage - admitted with infection.  Pt is s/p I and D of L ankle with removal of deep hardward and placement of installation wound vac on 05/12/20.  Clinical Impression  Pt admitted with above diagnosis. Pt is POD #1 and making good progress.  He was unable/declined wearing CAM boot due to edema and wound vac.  Pt able to maintain NWB status with transfers using RW.  Advised use of RW while hospitalized due to hospital gowns and lines/leads.  Progress to crutches for home as able.   Pt currently with functional limitations due to the deficits listed below (see PT Problem List). Pt will benefit from skilled PT to increase their independence and safety with mobility to allow discharge to the venue listed below.       Follow Up Recommendations No PT follow up (likely no initial PT f/u - may need outpt in future following surgeon's recommendation)    Equipment Recommendations  3in1 (PT)    Recommendations for Other Services       Precautions / Restrictions Precautions Precautions: Fall Precaution Comments: wound vac Restrictions Weight Bearing Restrictions: Yes LLE Weight Bearing: Non weight bearing Other Position/Activity Restrictions: Orders to maintain CAM boot - CAM boot was not on at arrival and pt reports too painful, edematous, and with wound vac unable/declined      Mobility  Bed Mobility Overal bed mobility: Independent             General bed mobility comments: in chair at arrival ; per OT note Independent  Transfers Overall transfer level: Needs assistance Equipment used: Rolling walker (2 wheeled) Transfers: Sit to/from Stand Sit to Stand: Supervision         General transfer comment: Demonstrated safe  sit to stand with good hand placement on chair then switching to RW.  Pt reports prefers crutches at home, but sees benefit of RW now with lines/leads and hospital gowns.  Ambulation/Gait Ambulation/Gait assistance: Supervision Gait Distance (Feet): 30 Feet Assistive device: Rolling walker (2 wheeled)   Gait velocity: decreased   General Gait Details: Demonstrated hop on R foot steadily with RW, maintained NWB well, educated on RW use  Stairs            Wheelchair Mobility    Modified Rankin (Stroke Patients Only)       Balance Overall balance assessment: Needs assistance Sitting-balance support: No upper extremity supported;Feet supported Sitting balance-Leahy Scale: Normal     Standing balance support: Bilateral upper extremity supported;Single extremity supported Standing balance-Leahy Scale: Poor Standing balance comment: Pt used RW in standing , able to remove one hand; was steady with RW and standing on R LE with L NWB                             Pertinent Vitals/Pain Pain Assessment: 0-10 Pain Score: 3  Pain Location: LLE Pain Descriptors / Indicators: Sore Pain Intervention(s): Limited activity within patient's tolerance;Monitored during session;Premedicated before session    Home Living Family/patient expects to be discharged to:: Private residence Living Arrangements: Alone Available Help at Discharge: Friend(s);Available PRN/intermittently;Family Type of Home: House Home Access: Stairs to enter Entrance Stairs-Rails: Left Entrance Stairs-Number of Steps: 3 Home Layout: Able  to live on main level with bedroom/bathroom;Multi-level Home Equipment: Crutches Additional Comments: reports has been using rolling sturdy office chair to move in house when he has to carry food, etc    Prior Function Level of Independence: Independent with assistive device(s)         Comments: For past several weeks pt has been limited due to ankle pain from  surgery.  He was independent with ADL, groceries delivered, driving, was washing up at the sink as shower is upstairs, no falls.  Has been wearing CAM boot at home. Prior to issues with ankle (since April) pt was completely independent and active without AD.  Works at [employer].     Hand Dominance   Dominant Hand: Right    Extremity/Trunk Assessment   Upper Extremity Assessment Upper Extremity Assessment: Overall WFL for tasks assessed    Lower Extremity Assessment Lower Extremity Assessment: LLE deficits/detail;RLE deficits/detail RLE Deficits / Details: WFL LLE Deficits / Details: R hip and knee ROM WFL and MMT at least 3/5;  R ankle : wound vac, not tested, orders for CAM boot so advised stabilization at this time LLE Sensation:  (numb from mid shin down, had nerve block during sx)    Cervical / Trunk Assessment Cervical / Trunk Assessment: Normal  Communication   Communication: No difficulties  Cognition Arousal/Alertness: Awake/alert Behavior During Therapy: WFL for tasks assessed/performed Overall Cognitive Status: Within Functional Limits for tasks assessed                                        General Comments General comments (skin integrity, edema, etc.): Educated on PT role/POC, mobility with RW, and NWB status.  Also, discussed wound vac and recommended no transfers during 10 min installiation phase to prevent wound vac leaking.    Exercises     Assessment/Plan    PT Assessment Patient needs continued PT services  PT Problem List Decreased strength;Decreased mobility;Decreased range of motion;Decreased knowledge of precautions;Decreased activity tolerance;Decreased balance;Decreased knowledge of use of DME       PT Treatment Interventions DME instruction;Therapeutic activities;Modalities;Gait training;Therapeutic exercise;Patient/family education;Stair training;Balance training;Functional mobility training    PT Goals (Current goals  can be found in the Care Plan section)  Acute Rehab PT Goals Patient Stated Goal: for LLE to heal properly PT Goal Formulation: With patient Time For Goal Achievement: 05/27/20 Potential to Achieve Goals: Good    Frequency Min 5X/week   Barriers to discharge Decreased caregiver support Limited support at home but pt reports he has been managing    Co-evaluation               AM-PAC PT "6 Clicks" Mobility  Outcome Measure Help needed turning from your back to your side while in a flat bed without using bedrails?: None Help needed moving from lying on your back to sitting on the side of a flat bed without using bedrails?: None Help needed moving to and from a bed to a chair (including a wheelchair)?: None Help needed standing up from a chair using your arms (e.g., wheelchair or bedside chair)?: None Help needed to walk in hospital room?: None Help needed climbing 3-5 steps with a railing? : A Little 6 Click Score: 23    End of Session Equipment Utilized During Treatment: Gait belt Activity Tolerance: Patient tolerated treatment well Patient left: in chair;with call bell/phone within reach Nurse Communication: Mobility  status PT Visit Diagnosis: Unsteadiness on feet (R26.81);Muscle weakness (generalized) (M62.81)    Time: 1210-1240 PT Time Calculation (min) (ACUTE ONLY): 30 min   Charges:     PT Treatments $Gait Training: 8-22 mins        Anise Salvo, PT Acute Rehab Services Pager [phone] [facility] [phone]    Rayetta Humphrey 05/13/2020, 12:51 PM

## 2020-05-13 NOTE — Consult Note (Signed)
[facility]    Date of Admission:  05/12/2020      Total days of antibiotics 2  Vancomycin + Zosyn                 Reason for Consult: Chronic osteomyelitis of Left ankle s/p hardware removal     Referring Provider: Lajoyce Corners  Primary Care Provider: Felix Pacini, FNP    Assessment: Travis Mayo is a 59 y.o. male with a history of ankle injury while outside hiking in Fall 2019 where he incurred a puncture from a shard of rock that did pierce his skin; has had a few short courses of antibiotics (most recently Doxy) to treat possible cellulitis and prednisone for swelling with overall worsening course now 20 months including ankle destruction. Underwent fusion of the ankle in late June 2021 with evidence of infection 6 weeks post op. He is now POD1 hardware removal with findings concerning for smoldering joint infection. With concern for initial innoculation event occurring in [place] would be concerned about possible atypical infections including mycobacteria - I called micro lab to have them set up specimen for AFB smear/culture which I am told they are able to run.   Some gram positive cocci in pairs on initial stain with cultures pending. Will continue Vancomycin + Ceftriaxone.  OK to place PICC line - sounds like he will be her for a bit with specialty wound care and instillation vac. Plan for return trip to OR early next week.    Plan: 1. Will go ahead with PICC 2. ABX - Vancomycin + Ceftriaxone  3. Follow micro information including fungus and AFB  4. Will consider adding some mycobacterial coverage      Active Problems:   Hardware complicating wound infection (HCC)   Abscess of bursa of left ankle   . docusate sodium  100 mg Oral BID    HPI: Travis Mayo is a 59 y.o. male here for elective removal of hardware due to post-operative infection following fusion ~6 weeks post op.   Travis Mayo sustained an injury to the left ankle in  October/November 2019 while hiking - [place] he had a shard of rock punctured the anterior ankle. He removed it at the time of injury. Reports it took a few months to clear up and stop draining. ER encounter in January 2020 where he noticed increased pain and swelling with redness to the joint. He was given clindamycin course at that time for cellulitis - at the time his joint was not significantly painful with active or passive ROM with improvement.  He has over the last year had moments where the ankle would swell up and become painful. Has been working with PCP and sent to Rheumatology for eval and treated with 1 week of prednisone a few times it sounds like. In June of 2021 after failing 1 week of prednisone his rheumatologist ordered an MRI that revealed significant ankle bony destruction of distal tibia talus and calcaneus due to chronic osteomyelitis with likely abscess. Was sent to ER for evaluation 6/09 --> Dr. Victorino Dike recommended amputation but sent to Dr. Lajoyce Corners for second opinion. Felt to be possibly due more to Charcot arthropy of the ankle joint given overly un-inflamed presentation. Underwent tibiocalcaneal fusion March 26, 2020. About 2 weeks post op had some increased redness around the stitches. Was supposed to be non-weight bearing with ortho boot but came to the office w/o it. Treated  with compression and total non-weight bearing with close follow up. Post OP week 6 he presented to the office with worsening swelling and drainage that Travis Mayo described to be brown/bloody appearing. He had some exposed tendon per Dr. Audrie Lia report in the office. He was given Doxycycline 8/10 and plans for excision of necrotic tissue with anticipation of IV antibiotics which occurred 05/12/2020.   Since surgery he has remained non-weight bearing. VAC in place. HE has not had any fevers or chills. Doing well with current IV antibiotic regimen of vancomycin + zosyn. Intraoperative findings not much over purulence aside  from small amounts at the screw heads to the bone without much necrotic tissue.   Review of Systems: Review of Systems  Constitutional: Negative for chills and fever.  Respiratory: Negative for cough.   Cardiovascular: Positive for leg swelling. Negative for chest pain.  Gastrointestinal: Negative for abdominal pain, nausea and vomiting.  Genitourinary: Negative for dysuria.  Musculoskeletal: Positive for joint pain.  Skin: Negative for itching and rash.    Past Medical History:  Diagnosis Date  . Arthritis   . Cataract   . Charcot's arthropathy    left  . Hypertension     Social History   Tobacco Use  . Smoking status: Current Some Day Smoker    Types: Cigars  . Smokeless tobacco: Never Used  . Tobacco comment: random  Vaping Use  . Vaping Use: Never used  Substance Use Topics  . Alcohol use: Yes    Alcohol/week: 9.0 standard drinks    Types: 4 Glasses of wine, 5 Cans of beer per week    Comment: 05/11/20- none at times  . Drug use: No    History reviewed. No pertinent family history. No Known Allergies  OBJECTIVE: Blood pressure 132/82, pulse (!) 102, temperature 97.9 F (36.6 C), temperature source Oral, resp. rate 17, height 6' (1.829 m), weight 95.3 kg, SpO2 100 %.  Physical Exam Vitals reviewed.  HENT:     Mouth/Throat:     Mouth: Mucous membranes are moist.  Eyes:     General: No scleral icterus.    Pupils: Pupils are equal, round, and reactive to light.  Cardiovascular:     Rate and Rhythm: Normal rate.  Pulmonary:     Effort: Pulmonary effort is normal.     Breath sounds: Normal breath sounds.  Abdominal:     General: There is no distension.     Tenderness: There is no abdominal tenderness.  Musculoskeletal:     Comments: L ankle swollen with vac in place.   Skin:    General: Skin is warm and dry.     Capillary Refill: Capillary refill takes less than 2 seconds.  Neurological:     Mental Status: He is alert and oriented to person, place,  and time.     Lab Results Lab Results  Component Value Date   WBC 15.6 (H) 05/12/2020   HGB 12.7 (L) 05/12/2020   HCT 40.3 05/12/2020   MCV 91.4 05/12/2020   PLT 469 (H) 05/12/2020    Lab Results  Component Value Date   CREATININE 0.89 05/12/2020   BUN 10 05/12/2020   NA 136 05/12/2020   K 3.3 (L) 05/12/2020   CL 102 05/12/2020   CO2 19 (L) 05/12/2020    Lab Results  Component Value Date   ALT 15 05/12/2020   AST 21 05/12/2020   ALKPHOS 59 05/12/2020   BILITOT 0.4 05/12/2020     Microbiology: Recent Results (from  the past 240 hour(s))  SARS CORONAVIRUS 2 (TAT 6-24 HRS) Nasopharyngeal Nasopharyngeal Swab     Status: None   Collection Time: 05/11/20 10:33 AM   Specimen: Nasopharyngeal Swab  Result Value Ref Range Status   SARS Coronavirus 2 NEGATIVE NEGATIVE Final    Comment: (NOTE) SARS-CoV-2 target nucleic acids are NOT DETECTED.  The SARS-CoV-2 RNA is generally detectable in upper and lower respiratory specimens during the acute phase of infection. Negative results do not preclude SARS-CoV-2 infection, do not rule out co-infections with other pathogens, and should not be used as the sole basis for treatment or other patient management decisions. Negative results must be combined with clinical observations, patient history, and epidemiological information. The expected result is Negative.  Fact Sheet for Patients: HairSlick.no  Fact Sheet for Healthcare Providers: quierodirigir.com  This test is not yet approved or cleared by the [place] FDA and  has been authorized for detection and/or diagnosis of SARS-CoV-2 by FDA under an Emergency Use Authorization (EUA). This EUA will remain  in effect (meaning this test can be used) for the duration of the COVID-19 declaration under Se ction 564(b)(1) of the Act, 21 U.S.C. section 360bbb-3(b)(1), unless the authorization is terminated or revoked  sooner.  Performed at [facility], [address], [place], [place] [address]   Surgical pcr screen     Status: Abnormal   Collection Time: 05/12/20  2:56 PM   Specimen: Nasal Mucosa; Nasal Swab  Result Value Ref Range Status   MRSA, PCR NEGATIVE NEGATIVE Final   Staphylococcus aureus POSITIVE (A) NEGATIVE Final    Comment: (NOTE) The Xpert SA Assay (FDA approved for NASAL specimens in patients 72 years of age and older), is one component of a comprehensive surveillance program. It is not intended to diagnose infection nor to guide or monitor treatment. Performed at [facility], [address], [place], [place] [address]   Aerobic/Anaerobic Culture (surgical/deep wound)     Status: None (Preliminary result)   Collection Time: 05/12/20  7:56 PM   Specimen: PATH Other; Tissue  Result Value Ref Range Status   Specimen Description TISSUE  Final   Special Requests LEFT ANKLE  Final   Gram Stain   Final    NO WBC SEEN RARE GRAM POSITIVE COCCI IN PAIRS Performed at [facility], [address], [place], [place] [address]    Culture PENDING  Incomplete   Report Status PENDING  Incomplete     Rexene Alberts, MSN, NP-C [facility]  [email or web address] Pager: [phone] Office: [phone] [facility] Main Line: [phone]

## 2020-05-13 NOTE — Plan of Care (Signed)

## 2020-05-14 ENCOUNTER — Inpatient Hospital Stay: Payer: Self-pay

## 2020-05-14 LAB — ACID FAST SMEAR (AFB, MYCOBACTERIA): Acid Fast Smear: NEGATIVE

## 2020-05-14 MED ORDER — CHLORHEXIDINE GLUCONATE CLOTH 2 % EX PADS
6.0000 | MEDICATED_PAD | Freq: Every day | CUTANEOUS | Status: AC
  Administered 2020-05-14 – 2020-05-18 (×5): 6 via TOPICAL

## 2020-05-14 MED ORDER — MUPIROCIN 2 % EX OINT
1.0000 "application " | TOPICAL_OINTMENT | Freq: Two times a day (BID) | CUTANEOUS | Status: AC
  Administered 2020-05-14 – 2020-05-18 (×10): 1 via NASAL
  Filled 2020-05-14 (×2): qty 22

## 2020-05-14 MED ORDER — CEFAZOLIN SODIUM-DEXTROSE 2-4 GM/100ML-% IV SOLN
2.0000 g | Freq: Three times a day (TID) | INTRAVENOUS | Status: DC
  Administered 2020-05-14 – 2020-05-22 (×23): 2 g via INTRAVENOUS
  Filled 2020-05-14 (×23): qty 100

## 2020-05-14 NOTE — Plan of Care (Signed)

## 2020-05-14 NOTE — Progress Notes (Signed)
Physical Therapy Treatment Patient Details Name: Travis Mayo MRN: [id number] DOB: [birth year] Today's Date: 05/14/2020    History of Present Illness Pt is 59 yo male who is 6 weeks s/p L tibial calcaneal fusion who presented with acute pain, swelling, and drainage - admitted with infection.  Pt is s/p I and D of L ankle with removal of deep hardward and placement of installation wound vac on 05/12/20.  Reports plan for further surgery next week to close wound.    PT Comments    Pt demonstrating increased gait with good maintenance of NWB status.  Did not attempt stairs today due to IV team coming for Central line and pt needing to be in bed.  Pt is confident in his ability to ambulate and do stairs with crutches and NWB as he has been doing it for several weeks.  Decreased frequency of PT to 3x week due to good progress; otherwise cont POC.     Follow Up Recommendations  No PT follow up     Equipment Recommendations  3in1 (PT)    Recommendations for Other Services       Precautions / Restrictions Precautions Precautions: Fall Precaution Comments: wound vac Restrictions LLE Weight Bearing: Non weight bearing Other Position/Activity Restrictions: Orders to maintain CAM boot - CAM boot was not on at arrival and pt reports too painful, edematous, and with wound vac unable/declined    Mobility  Bed Mobility Overal bed mobility: Modified Independent Bed Mobility: Supine to Sit;Sit to Supine     Supine to sit: Modified independent (Device/Increase time) Sit to supine: Modified independent (Device/Increase time)   General bed mobility comments: HOB elevated  Transfers Overall transfer level: Needs assistance Equipment used: Rolling walker (2 wheeled) Transfers: Sit to/from Stand Sit to Stand: Supervision         General transfer comment: Demonstrated safe sit to stand with good hand placement on bed then switching to RW.  Pt reports prefers crutches at home, but sees  benefit of RW now with lines/leads and hospital gowns.  Ambulation/Gait Ambulation/Gait assistance: Supervision Gait Distance (Feet): 60 Feet Assistive device: Rolling walker (2 wheeled)   Gait velocity: decreased   General Gait Details: Demonstrated hop on R foot steadily with RW, maintained NWB well, educated on RW use - further ambulation limited due to IV team called and coming to place central line and need pt back in bed   Stairs Stairs:  (held due to getting Central line placed; pt reports confident in stairs - will attempt later)           Wheelchair Mobility    Modified Rankin (Stroke Patients Only)       Balance Overall balance assessment: Needs assistance Sitting-balance support: No upper extremity supported;Feet supported Sitting balance-Leahy Scale: Normal     Standing balance support: Bilateral upper extremity supported;No upper extremity supported Standing balance-Leahy Scale: Fair Standing balance comment: SLS on R LE with L NWB, used RW for ambulation but able to lift hands off walker for short period on time                            Cognition Arousal/Alertness: Awake/alert Behavior During Therapy: WFL for tasks assessed/performed Overall Cognitive Status: Within Functional Limits for tasks assessed  Exercises      General Comments General comments (skin integrity, edema, etc.): Educated on importance of frequent mobility even if for short distances or transfers.  Pt reports he has been transferring bed to chair independently.  Educated on stair technique of L rail and crutches - pt reports that is how he does at home.  Will attempt later date.      Pertinent Vitals/Pain Pain Assessment: 0-10 Pain Score: 3  Pain Location: LLE Pain Descriptors / Indicators: Sore Pain Intervention(s): Limited activity within patient's tolerance    Home Living                       Prior Function            PT Goals (current goals can now be found in the care plan section) Acute Rehab PT Goals Patient Stated Goal: for LLE to heal properly PT Goal Formulation: With patient Time For Goal Achievement: 05/27/20 Potential to Achieve Goals: Good Progress towards PT goals: Progressing toward goals    Frequency    Min 3X/week      PT Plan Frequency needs to be updated    Co-evaluation              AM-PAC PT "6 Clicks" Mobility   Outcome Measure  Help needed turning from your back to your side while in a flat bed without using bedrails?: None Help needed moving from lying on your back to sitting on the side of a flat bed without using bedrails?: None Help needed moving to and from a bed to a chair (including a wheelchair)?: None Help needed standing up from a chair using your arms (e.g., wheelchair or bedside chair)?: None Help needed to walk in hospital room?: None Help needed climbing 3-5 steps with a railing? : A Little 6 Click Score: 23    End of Session Equipment Utilized During Treatment: Gait belt Activity Tolerance: Patient tolerated treatment well Patient left: with call bell/phone within reach;in bed Nurse Communication: Mobility status PT Visit Diagnosis: Unsteadiness on feet (R26.81);Muscle weakness (generalized) (M62.81)     Time: 1093-2355 PT Time Calculation (min) (ACUTE ONLY): 18 min  Charges:  $Gait Training: 8-22 mins                     Anise Salvo, PT Acute Rehab Services Pager [phone] [facility] [phone]     Rayetta Humphrey 05/14/2020, 3:47 PM

## 2020-05-14 NOTE — Progress Notes (Signed)
At patient bedside to obtain consent for PICC.  Patient states that he is not discharging until next Friday and understands that he will need for discharge home but would like to wait until closer to time to leave.  Paged Rexene Alberts, NP with infectious disease and made aware of above.  Judeth Cornfield states that it is okay to wait until next week to place as long as patient has adequate IV access, which he does.  Ok given to cancel PICC order at this time and ID will reorder closer to discharge home.

## 2020-05-14 NOTE — Progress Notes (Signed)
   Subjective: 2 Days Post-Op Procedure(s) (LRB): DEBRIDEMENT LEFT ANKLE & REMOVAL DEEP HARDWARE (Left) Patient reports pain as mild.    Objective: Vital signs in last 24 hours: Temp:  [98.2 F (36.8 C)-98.8 F (37.1 C)] 98.3 F (36.8 C) (08/13 0801) Pulse Rate:  [88-100] 100 (08/13 0801) Resp:  [16-18] 17 (08/13 0801) BP: (134-144)/(81-91) 134/91 (08/13 0801) SpO2:  [98 %-99 %] 99 % (08/13 0801)  Intake/Output from previous day: 08/12 0701 - 08/13 0700 In: 906.1 [I.V.:306.1; IV Piggyback:600] Out: 200 [Urine:100; Drains:100] Intake/Output this shift: Total I/O In: 240 [P.O.:240] Out: -   Recent Labs    05/12/20 1556  HGB 12.7*   Recent Labs    05/12/20 1556  WBC 15.6*  RBC 4.41  HCT 40.3  PLT 469*   Recent Labs    05/12/20 1556  NA 136  K 3.3*  CL 102  CO2 19*  BUN 10  CREATININE 0.89  GLUCOSE 91  CALCIUM 9.8   No results for input(s): LABPT, INR in the last 72 hours.  VAC working. good seal.  No results found.  Assessment/Plan: 2 Days Post-Op Procedure(s) (LRB): DEBRIDEMENT LEFT ANKLE & REMOVAL DEEP HARDWARE (Left) Plan   Gram positive in pairs.     PCR was positive for MSSA nasal swab.   Will see what grows from ankle and adjust ABX based on cultures.   Travis Mayo 05/14/2020, 10:53 AM

## 2020-05-14 NOTE — Progress Notes (Signed)
[facility]  Date of Admission:  05/12/2020       Total days of antibiotics 2  Vanc + ceftriaxone    ASSESSMENT: Travis Mayo is a 59 y.o. 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 Awaiting cultures including fungal and AFB. Initial AFB smear is negative but low sensitivity - will follow culture over the next 4-6 weeks for updates on growth.   Continue vancomycin + ceftraixone for now. GPCs on stain from intraop awaiting final cultures.    PLAN: 1. Continue current antibiotics 2. Follow micro for updates 3. Follow fungal & AFB also  4. PICC line   Will follow cultures remotely over the weekend and plan on following up Monday to help with finalizing treatment plan.   Active Problems:   Hardware complicating wound infection (HCC)   Abscess of bursa of left ankle   . docusate sodium  100 mg Oral BID    SUBJECTIVE: Feeling OK today. Pain is pretty mild overall. Tolerating antibiotics well so far.     Review of Systems: Review of Systems  Constitutional: Negative for chills and fever.  HENT: Negative for tinnitus.   Eyes: Negative for blurred vision and photophobia.  Respiratory: Negative for cough and sputum production.   Cardiovascular: Negative for chest pain.  Gastrointestinal: Negative for diarrhea, nausea and vomiting.  Genitourinary: Negative for dysuria.  Musculoskeletal: Positive for joint pain.  Skin: Negative for rash.  Neurological: Negative for headaches.    No Known Allergies  OBJECTIVE: Vitals:   05/13/20 0851 05/13/20 1500 05/13/20 1946 05/14/20 0801  BP: 132/82 134/81 (!) 144/87 (!) 134/91  Pulse: (!) 102 88 100 100  Resp: 17 16 18 17   Temp: 97.9 F (36.6 C) 98.2 F (36.8 C) 98.8 F (37.1 C) 98.3 F (36.8 C)  TempSrc: Oral Oral Oral Oral  SpO2: 100% 98% 98% 99%  Weight:      Height:       Body mass index is 28.48 kg/m.  Physical Exam Constitutional:      Appearance:  Normal appearance. He is not ill-appearing.  HENT:     Head: Normocephalic.     Mouth/Throat:     Mouth: Mucous membranes are moist.     Pharynx: Oropharynx is clear.  Eyes:     General: No scleral icterus. Cardiovascular:     Rate and Rhythm: Normal rate.  Pulmonary:     Effort: Pulmonary effort is normal.  Musculoskeletal:        General: Normal range of motion.     Cervical back: Normal range of motion.     Comments: Left foot with instillation vac in place. Significant swelling noted. No drainage.   Skin:    Capillary Refill: Capillary refill takes less than 2 seconds.     Coloration: Skin is not jaundiced or pale.  Neurological:     Mental Status: He is alert and oriented to person, place, and time.  Psychiatric:        Mood and Affect: Mood normal.        Judgment: Judgment normal.     Lab Results Lab Results  Component Value Date   WBC 15.6 (H) 05/12/2020   HGB 12.7 (L) 05/12/2020   HCT 40.3 05/12/2020   MCV 91.4 05/12/2020   PLT 469 (H) 05/12/2020    Lab Results  Component Value Date   CREATININE 0.89 05/12/2020   BUN 10 05/12/2020   NA  136 05/12/2020   K 3.3 (L) 05/12/2020   CL 102 05/12/2020   CO2 19 (L) 05/12/2020    Lab Results  Component Value Date   ALT 15 05/12/2020   AST 21 05/12/2020   ALKPHOS 59 05/12/2020   BILITOT 0.4 05/12/2020     Microbiology: Recent Results (from the past 240 hour(s))  SARS CORONAVIRUS 2 (TAT 6-24 HRS) Nasopharyngeal Nasopharyngeal Swab     Status: None   Collection Time: 05/11/20 10:33 AM   Specimen: Nasopharyngeal Swab  Result Value Ref Range Status   SARS Coronavirus 2 NEGATIVE NEGATIVE Final    Comment: (NOTE) SARS-CoV-2 target nucleic acids are NOT DETECTED.  The SARS-CoV-2 RNA is generally detectable in upper and lower respiratory specimens during the acute phase of infection. Negative results do not preclude SARS-CoV-2 infection, do not rule out co-infections with other pathogens, and should not be used  as the sole basis for treatment or other patient management decisions. Negative results must be combined with clinical observations, patient history, and epidemiological information. The expected result is Negative.  Fact Sheet for Patients: HairSlick.no  Fact Sheet for Healthcare Providers: quierodirigir.com  This test is not yet approved or cleared by the [place] FDA and  has been authorized for detection and/or diagnosis of SARS-CoV-2 by FDA under an Emergency Use Authorization (EUA). This EUA will remain  in effect (meaning this test can be used) for the duration of the COVID-19 declaration under Se ction 564(b)(1) of the Act, 21 U.S.C. section 360bbb-3(b)(1), unless the authorization is terminated or revoked sooner.  Performed at [facility], [address] [place], [place] [address]   Surgical pcr screen     Status: Abnormal   Collection Time: 05/12/20  2:56 PM   Specimen: Nasal Mucosa; Nasal Swab  Result Value Ref Range Status   MRSA, PCR NEGATIVE NEGATIVE Final   Staphylococcus aureus POSITIVE (A) NEGATIVE Final    Comment: (NOTE) The Xpert SA Assay (FDA approved for NASAL specimens in patients 58 years of age and older), is one component of a comprehensive surveillance program. It is not intended to diagnose infection nor to guide or monitor treatment. Performed at [facility], [address] [place], [place] [address]   Aerobic/Anaerobic Culture (surgical/deep wound)     Status: None (Preliminary result)   Collection Time: 05/12/20  7:56 PM   Specimen: PATH Other; Tissue  Result Value Ref Range Status   Specimen Description TISSUE  Final   Special Requests LEFT ANKLE  Final   Gram Stain   Final    NO WBC SEEN RARE GRAM POSITIVE COCCI IN PAIRS Performed at [facility], [address], [place], [place] [address]    Culture PENDING  Incomplete   Report Status PENDING  Incomplete    Acid Fast Smear (AFB)     Status: None   Collection Time: 05/13/20  9:20 AM   Specimen: Ankle; Tissue  Result Value Ref Range Status   AFB Specimen Processing Comment  Final    Comment: Tissue Grinding and Digestion/Decontamination   Acid Fast Smear Negative  Final    Comment: (NOTE) Performed At: [facility] [address] [place], [place] [id number] Jolene Schimke MD MV:[id number]    Source (AFB) TISSUE  Final    Comment: LEFT ANKLE SPEC A Performed at [facility], [address], [place], [place] [address]     Rexene Alberts, MSN, NP-C [facility]  [email or web address] Pager: [phone] Office: [phone] [place]: [phone]

## 2020-05-15 NOTE — Progress Notes (Signed)
Culture growth MSSA from the OR and will stop the vancomycin, continue with cefazolin.  Dr. Drue Second to follow up again on Monday.   Gardiner Barefoot, MD

## 2020-05-15 NOTE — Plan of Care (Signed)

## 2020-05-15 NOTE — Progress Notes (Addendum)
   Subjective: 3 Days Post-Op Procedure(s) (LRB): DEBRIDEMENT LEFT ANKLE & REMOVAL DEEP HARDWARE (Left) Patient reports pain as mild.    Objective: Vital signs in last 24 hours: Temp:  [97.8 F (36.6 C)-98.4 F (36.9 C)] 97.8 F (36.6 C) (08/14 0751) Pulse Rate:  [91-98] 98 (08/14 0751) Resp:  [16-17] 16 (08/14 0751) BP: (132-142)/(86-93) 132/93 (08/14 0751) SpO2:  [97 %-100 %] 100 % (08/14 0751)  Intake/Output from previous day: 08/13 0701 - 08/14 0700 In: 1560 [P.O.:960; IV Piggyback:600] Out: 2250 [Urine:2050; Drains:200] Intake/Output this shift: Total I/O In: 240 [P.O.:240] Out: 300 [Urine:300]  Recent Labs    05/12/20 1556  HGB 12.7*   Recent Labs    05/12/20 1556  WBC 15.6*  RBC 4.41  HCT 40.3  PLT 469*   Recent Labs    05/12/20 1556  NA 136  K 3.3*  CL 102  CO2 19*  BUN 10  CREATININE 0.89  GLUCOSE 91  CALCIUM 9.8   No results for input(s): LABPT, INR in the last 72 hours.  VAC seal good [place] EKG SITE RITE  Result Date: 05/14/2020 If Site Rite image not attached, placement could not be confirmed due to current cardiac rhythm.   Assessment/Plan: 3 Days Post-Op Procedure(s) (LRB): DEBRIDEMENT LEFT ANKLE & REMOVAL DEEP HARDWARE (Left) Up with therapy. Waiting on cultures not back yet. Moderate staph aureus.   Travis Mayo 05/15/2020, 11:02 AM

## 2020-05-16 NOTE — Plan of Care (Signed)

## 2020-05-16 NOTE — Progress Notes (Signed)
   Subjective: 4 Days Post-Op Procedure(s) (LRB): DEBRIDEMENT LEFT ANKLE & REMOVAL DEEP HARDWARE (Left) Patient reports pain as mild.    Objective: Vital signs in last 24 hours: Temp:  [97.7 F (36.5 C)-98 F (36.7 C)] 97.7 F (36.5 C) (08/15 0750) Pulse Rate:  [85-95] 87 (08/15 0750) Resp:  [16-18] 16 (08/15 0750) BP: (139-143)/(80-95) 141/80 (08/15 0750) SpO2:  [97 %-99 %] 97 % (08/15 0750)  Intake/Output from previous day: 08/14 0701 - 08/15 0700 In: 1400 [P.O.:1400] Out: 1910 [Urine:1700; Drains:210] Intake/Output this shift: No intake/output data recorded.  No results for input(s): HGB in the last 72 hours. No results for input(s): WBC, RBC, HCT, PLT in the last 72 hours. No results for input(s): NA, K, CL, CO2, BUN, CREATININE, GLUCOSE, CALCIUM in the last 72 hours. No results for input(s): LABPT, INR in the last 72 hours.  VAC seal good.  No results found.  Assessment/Plan: 4 Days Post-Op Procedure(s) (LRB): DEBRIDEMENT LEFT ANKLE & REMOVAL DEEP HARDWARE (Left) Plan:   Cultures  MSSA  Travis Mayo 05/16/2020, 10:46 AM

## 2020-05-16 NOTE — Plan of Care (Signed)

## 2020-05-17 ENCOUNTER — Inpatient Hospital Stay (HOSPITAL_COMMUNITY): Payer: Self-pay

## 2020-05-17 ENCOUNTER — Inpatient Hospital Stay: Payer: Self-pay

## 2020-05-17 ENCOUNTER — Other Ambulatory Visit: Payer: Self-pay | Admitting: Physician Assistant

## 2020-05-17 DIAGNOSIS — I350 Nonrheumatic aortic (valve) stenosis: Secondary | ICD-10-CM

## 2020-05-17 LAB — ECHOCARDIOGRAM LIMITED
AR max vel: 1.36 cm2
AV Area VTI: 1.54 cm2
AV Area mean vel: 1.17 cm2
AV Mean grad: 12 mmHg
AV Peak grad: 22.1 mmHg
Ao pk vel: 2.35 m/s
Height: 72 in
S' Lateral: 2.8 cm
Weight: 3360 oz

## 2020-05-17 NOTE — TOC Initial Note (Signed)
Transition of Care [facility]) - Initial/Assessment Note    Patient Details  Name: Travis Mayo MRN: [id number] Date of Birth: 11-10-1960  [facility]) CM/SW Contact:    Curlene Labrum, RN Phone Number: 05/17/2020, 1:05 PM  Clinical Narrative:                 Case management met with the patient regarding transitions of care for patient S/P Left Medial malleolus hardware removal, I&D and infection.  The patient lives alone at his home but has access to care help through friends and family - son is visiting patient in the next week from out of town.  The patient currently works for [employer] and does not have medical insurance at this time.  The patient is planning to return to the OR this Friday for repeat I and D and plans for 6 weeks of antibiotics - having PICC line placed tomorrow.  I called Carolynn Sayers, RNCM with [employer] and she will be following the patient for IV antibiotics for discharge home and possible set up with [facility] since patient is uninsured and will be covered with a payment plan offered to the patient through both companies for IV support.    Patient currently uses crutches for home - and may need dme after next surgery - pending PT evaluation.  Will continue to follow for discharge needs.  Expected Discharge Plan: [place] Barriers to Discharge: Continued Medical Work up   Patient Goals and CMS Choice Patient states their goals for this hospitalization and ongoing recovery are:: Plans to return to OR on Friday 8/20 - repeat I&D  - plans to discharge home with IV antibiotics for 6 weeks CMS Medicare.gov Compare Post Acute Care list provided to:: Patient Choice offered to / list presented to : Patient  Expected Discharge Plan and Services Expected Discharge Plan: [place]   Discharge Planning Services: CM Consult Post Acute Care Choice: [place] arrangements for the past 2 months: Apartment                                       Prior Living Arrangements/Services Living arrangements for the past 2 months: Apartment Lives with:: Self Patient language and need for interpreter reviewed:: Yes Do you feel safe going back to the place where you live?: Yes      Need for Family Participation in Patient Care: Yes (Comment) Care giver support system in place?: Yes (comment) Current home services: DME (currently has crutches) Criminal Activity/Legal Involvement Pertinent to Current Situation/Hospitalization: No - Comment as needed  Activities of Daily Living Home Assistive Devices/Equipment: Crutches ADL Screening (condition at time of admission) Patient's cognitive ability adequate to safely complete daily activities?: Yes Is the patient deaf or have difficulty hearing?: No Does the patient have difficulty seeing, even when wearing glasses/contacts?: No Does the patient have difficulty concentrating, remembering, or making decisions?: No Patient able to express need for assistance with ADLs?: Yes Does the patient have difficulty dressing or bathing?: No Independently performs ADLs?: Yes (appropriate for developmental age) Does the patient have difficulty walking or climbing stairs?: Yes Weakness of Legs: Left Weakness of Arms/Hands: None  Permission Sought/Granted Permission sought to share information with : Case Manager Permission granted to share information with : Yes, Verbal Permission Granted     Permission granted to share info w AGENCY: [facility]  agency for Iv antibiotics/ PICC dressing changes        Emotional Assessment Appearance:: Appears stated age Attitude/Demeanor/Rapport: Gracious Affect (typically observed): Accepting Orientation: : Oriented to Self, Oriented to Place, Oriented to  Time, Oriented to Situation Alcohol / Substance Use: Tobacco Use, Alcohol Use Psych Involvement: No (comment)  Admission diagnosis:  Abscess of bursa of left ankle  [M71.072] Patient Active Problem List   Diagnosis Date Noted   Abscess of bursa of left ankle 75/43/6067   Hardware complicating wound infection ([place])    Left ankle pain 03/26/2020   Charcot's joint, left ankle and foot    PCP:  Beverley Fiedler, FNP Pharmacy:   [id number]: [phone]: [phone]    Social Determinants of Health (SDOH) Interventions    Readmission Risk Interventions Readmission Risk Prevention Plan 05/17/2020  Post Dischage Appt Complete  Medication Screening Complete  Transportation Screening Complete  Some recent data might be hidden

## 2020-05-17 NOTE — Progress Notes (Addendum)
Travis Mayo for Infectious Disease  Date of Admission:  05/12/2020       Total days of antibiotics 5  Cefazolin     ASSESSMENT: Travis Mayo is a 59 y.o. 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 Now s/p hardware removal after attempted fusion. MSSA from cultures on OR with fungal and AFB pending.   Plan for 6 weeks of IV and reassess.    PLAN: 1. Continue Cefazolin  2. Follow up on AFB in 4 weeks 3. PICC placed when convenient for Travis Mayo (tomorrow he says)  4. OPAT as outlined below  OPAT ORDERS:  Diagnosis: osteomyelitis L ankle   Culture Result: MSSA   No Known Allergies   Discharge antibiotics to be given via PICC line:  Per pharmacy protocol CEFAZOLIN     Duration: 6 weeks    End Date: 06/23/2020  [facility] Care Per Protocol with [employer] Use: Home health RN for IV administration and teaching, line care and labs.    Labs weekly while on IV antibiotics: _x_ CBC with differential __ BMP _x_ CMP _x_ CRP _x_ ESR __ Vancomycin trough __ CK  __ Please pull PIC at completion of IV antibiotics _x_ Please leave PIC in place until doctor has seen patient or been notified  Fax weekly labs to [phone]  Clinic Follow Up Appt: 06/15/2020 with Dr. Baxter Flattery @ 2:00 pm       Active Problems:   Hardware complicating wound infection (Travis Mayo)   Abscess of bursa of left ankle   . Chlorhexidine Gluconate Cloth  6 each Topical Daily  . docusate sodium  100 mg Oral BID  . mupirocin ointment  1 application Nasal BID    SUBJECTIVE: Doing well from weekend - OK with PICC tomorrow AM. Swelling a little better.   Review of Systems: Review of Systems  Constitutional: Negative for chills and fever.  HENT: Negative for tinnitus.   Eyes: Negative for blurred vision and photophobia.  Respiratory: Negative for cough and sputum production.   Cardiovascular: Negative for chest pain.    Gastrointestinal: Negative for diarrhea, nausea and vomiting.  Genitourinary: Negative for dysuria.  Musculoskeletal: Positive for joint pain.  Skin: Negative for rash.  Neurological: Negative for headaches.    No Known Allergies  OBJECTIVE: Vitals:   05/16/20 0750 05/16/20 1440 05/16/20 1955 05/17/20 0817  BP: (!) 141/80 (!) 144/83 130/88 122/69  Pulse: 87 89 93 75  Resp: '16 17 16 17  '$ Temp: 97.7 F (36.5 C) 98.2 F (36.8 C) 98.4 F (36.9 C) 98.2 F (36.8 C)  TempSrc: Oral Oral Oral Oral  SpO2: 97% 100% 99% 99%  Weight:      Height:       Body mass index is 28.48 kg/m.  Physical Exam Constitutional:      Appearance: Normal appearance. He is not ill-appearing.  HENT:     Head: Normocephalic.     Mouth/Throat:     Mouth: Mucous membranes are moist.     Pharynx: Oropharynx is clear.  Eyes:     General: No scleral icterus. Cardiovascular:     Rate and Rhythm: Normal rate.  Pulmonary:     Effort: Pulmonary effort is normal.  Musculoskeletal:        General: Normal range of motion.     Cervical back: Normal range of motion.     Comments: Left foot with instillation vac in place. Significant swelling noted.  No drainage.   Skin:    Capillary Refill: Capillary refill takes less than 2 seconds.     Coloration: Skin is not jaundiced or pale.  Neurological:     Mental Status: He is alert and oriented to person, place, and time.  Psychiatric:        Mood and Affect: Mood normal.        Judgment: Judgment normal.     Lab Results Lab Results  Component Value Date   WBC 15.6 (H) 05/12/2020   HGB 12.7 (L) 05/12/2020   HCT 40.3 05/12/2020   MCV 91.4 05/12/2020   PLT 469 (H) 05/12/2020    Lab Results  Component Value Date   CREATININE 0.89 05/12/2020   BUN 10 05/12/2020   NA 136 05/12/2020   K 3.3 (L) 05/12/2020   CL 102 05/12/2020   CO2 19 (L) 05/12/2020    Lab Results  Component Value Date   ALT 15 05/12/2020   AST 21 05/12/2020   ALKPHOS 59 05/12/2020    BILITOT 0.4 05/12/2020     Microbiology: Recent Results (from the past 240 hour(s))  SARS CORONAVIRUS 2 (TAT 6-24 HRS) Nasopharyngeal Nasopharyngeal Swab     Status: None   Collection Time: 05/11/20 10:33 AM   Specimen: Nasopharyngeal Swab  Result Value Ref Range Status   SARS Coronavirus 2 NEGATIVE NEGATIVE Final    Comment: (NOTE) SARS-CoV-2 target nucleic acids are NOT DETECTED.  The SARS-CoV-2 RNA is generally detectable in upper and lower respiratory specimens during the acute phase of infection. Negative results do not preclude SARS-CoV-2 infection, do not rule out co-infections with other pathogens, and should not be used as the sole basis for treatment or other patient management decisions. Negative results must be combined with clinical observations, patient history, and epidemiological information. The expected result is Negative.  Fact Sheet for Patients: SugarRoll.be  Fact Sheet for Healthcare Providers: https://www.woods-mathews.com/  This test is not yet approved or cleared by the [place] FDA and  has been authorized for detection and/or diagnosis of SARS-CoV-2 by FDA under an Emergency Use Authorization (EUA). This EUA will remain  in effect (meaning this test can be used) for the duration of the COVID-19 declaration under Se ction 564(b)(1) of the Act, 21 U.S.C. section 360bbb-3(b)(1), unless the authorization is terminated or revoked sooner.  Performed at [facility], [place] [address] [place], [place] [address]   Surgical pcr screen     Status: Abnormal   Collection Time: 05/12/20  2:56 PM   Specimen: Nasal Mucosa; Nasal Swab  Result Value Ref Range Status   MRSA, PCR NEGATIVE NEGATIVE Final   Staphylococcus aureus POSITIVE (A) NEGATIVE Final    Comment: (NOTE) The Xpert SA Assay (FDA approved for NASAL specimens in patients 58 years of age and older), is one component of a  comprehensive surveillance program. It is not intended to diagnose infection nor to guide or monitor treatment. Performed at [facility], [place] [address], [place], [place] [address]   Aerobic/Anaerobic Culture (surgical/deep wound)     Status: None (Preliminary result)   Collection Time: 05/12/20  7:56 PM   Specimen: PATH Other; Tissue  Result Value Ref Range Status   Specimen Description TISSUE  Final   Special Requests LEFT ANKLE  Final   Gram Stain   Final    NO WBC SEEN RARE GRAM POSITIVE COCCI IN PAIRS Performed at [facility], [address], [place], [place] [address]  Culture   Final    MODERATE STAPHYLOCOCCUS AUREUS NO ANAEROBES ISOLATED; CULTURE IN PROGRESS FOR 5 DAYS    Report Status PENDING  Incomplete   Organism ID, Bacteria STAPHYLOCOCCUS AUREUS  Final      Susceptibility   Staphylococcus aureus - MIC*    CIPROFLOXACIN <=0.5 SENSITIVE Sensitive     ERYTHROMYCIN <=0.25 SENSITIVE Sensitive     GENTAMICIN <=0.5 SENSITIVE Sensitive     OXACILLIN 0.5 SENSITIVE Sensitive     TETRACYCLINE <=1 SENSITIVE Sensitive     VANCOMYCIN <=0.5 SENSITIVE Sensitive     TRIMETH/SULFA <=10 SENSITIVE Sensitive     CLINDAMYCIN <=0.25 SENSITIVE Sensitive     RIFAMPIN <=0.5 SENSITIVE Sensitive     Inducible Clindamycin NEGATIVE Sensitive     * MODERATE STAPHYLOCOCCUS AUREUS  Acid Fast Smear (AFB)     Status: None   Collection Time: 05/13/20  9:20 AM   Specimen: Ankle; Tissue  Result Value Ref Range Status   AFB Specimen Processing Comment  Final    Comment: Tissue Grinding and Digestion/Decontamination   Acid Fast Smear Negative  Final    Comment: (NOTE) Performed At: [facility] [place], [place] [id number] Rush Farmer MD KX:[id number]    Source (AFB) TISSUE  Final    Comment: LEFT ANKLE SPEC A Performed at [facility], [place] [address] [place],  [address]     Janene Madeira, MSN, NP-C [place] for Infectious  Disease [email or web address] Pager: [phone] Office: [phone] [place]: [phone]

## 2020-05-17 NOTE — Plan of Care (Signed)
  Problem: Health Behavior/Discharge Planning: Goal: Ability to manage health-related needs will improve Outcome: Progressing   Problem: Clinical Measurements: Goal: Ability to maintain clinical measurements within normal limits will improve Outcome: Progressing   Problem: Nutrition: Goal: Adequate nutrition will be maintained Outcome: Progressing   Problem: Coping: Goal: Level of anxiety will decrease Outcome: Progressing   Problem: Pain Managment: Goal: General experience of comfort will improve Outcome: Progressing   Problem: Safety: Goal: Ability to remain free from injury will improve Outcome: Progressing   Problem: Skin Integrity: Goal: Risk for impaired skin integrity will decrease Outcome: Progressing   

## 2020-05-17 NOTE — Consult Note (Signed)
WOC Nurse wound follow up Wound type:surgically debrided, removal hardware left medial malleolus   Measurement:with sponge intact:  15 cm x 7 cm  Wound bed:not assessed  Sponge in place per MD order Drainage (amount, consistency, odor) canister with serosanguinous effluent and instill solution in place.  Periwound:maceration to periwound, seal was patched over the weekend due to leak. This skin is protected with skin prep Dressing procedure/placement/frequency:old drape removed, cleansed and skin prep applied.  New drape and instill trac pad is placed. Seal is achieved and instillation began.  backto OR this week.  Willl follow Maple Hudson MSN, RN, FNP-BC CWON Wound, Ostomy, Continence Nurse Pager [phone]

## 2020-05-17 NOTE — Progress Notes (Signed)
PHARMACY CONSULT NOTE FOR:  OUTPATIENT  PARENTERAL ANTIBIOTIC THERAPY (OPAT)  Indication: Infection of left ankle wound  Regimen: Cefazolin 2g q8hr End date: 06/23/2020  IV antibiotic discharge orders are pended. To discharging provider:  please sign these orders via discharge navigator,  Select New Orders & click on the button choice - Manage This Unsigned Work.     Thank you for allowing pharmacy to be a part of this patient's care.  Lamar Sprinkles, PharmD PGY1 Pharmacy Resident 05/17/2020 2:05 PM

## 2020-05-17 NOTE — Progress Notes (Signed)
Physical Therapy Treatment Patient Details Name: Travis Mayo MRN: [id number] DOB: 1960/11/28 Today's Date: 05/17/2020    History of Present Illness Pt is 59 yo male who is 6 weeks s/p L tibial calcaneal fusion who presented with acute pain, swelling, and drainage - admitted with infection.  Pt is s/p I and D of L ankle with removal of deep hardward and placement of installation wound vac on 05/12/20.  Reports plan for further surgery next week to close wound.    PT Comments    Pt in bed upon arrival of PT, agreeable to session with focus on progression of OOB mobility, exercises to maintain strength, and independence with transfers. The pt was able to demo good tolerance for standing exercises and short ambulation in his room with supervision for safety and lines. He was also able to demo multiple pivot transfers from bed-chair and is safe to perform independently when his lines have been arranged to ensure all devices are plugged in. The pt will continue to benefit from skilled PT to progress functional strength and stability to facilitate return to independence at home.    Follow Up Recommendations  No PT follow up     Equipment Recommendations  3in1 (PT)    Recommendations for Other Services       Precautions / Restrictions Precautions Precautions: Fall Precaution Comments: wound vac Restrictions Weight Bearing Restrictions: Yes LLE Weight Bearing: Non weight bearing Other Position/Activity Restrictions: Orders to maintain CAM boot - CAM boot was not on at arrival and pt reports too painful, edematous, and with wound vac unable/declined    Mobility  Bed Mobility Overal bed mobility: Modified Independent Bed Mobility: Supine to Sit     Supine to sit: Modified independent (Device/Increase time)     General bed mobility comments: pt able to manage lines or direct line management, no assist needed  Transfers Overall transfer level: Needs assistance Equipment used:  Rolling walker (2 wheeled) Transfers: Sit to/from [employer] Sit to Stand: Supervision   Squat pivot transfers: Independent     General transfer comment: Pt able to demo sit-stand with supervision only with use of RW, completed multiple times through session. Pt then completed x6 squat-pivot transfers between bed and recliner with good stability, safety awareness, and independence. the pt is able to transfer without assist or supervision when all of his lines are set up for him.  Ambulation/Gait Ambulation/Gait assistance: Supervision   Assistive device: Rolling walker (2 wheeled)   Gait velocity: decreased   General Gait Details: Demonstrated hop on R foot steadily with RW, maintained NWB well, educated on RW use - further ambulation limited as pt desired to stay in the room at this time      Balance Overall balance assessment: Needs assistance Sitting-balance support: No upper extremity supported;Feet supported Sitting balance-Leahy Scale: Normal     Standing balance support: Bilateral upper extremity supported;No upper extremity supported Standing balance-Leahy Scale: Fair Standing balance comment: SLS on R LE with L NWB, used RW for ambulation but able to lift hands off walker for short period on time                            Cognition Arousal/Alertness: Awake/alert Behavior During Therapy: WFL for tasks assessed/performed Overall Cognitive Status: Within Functional Limits for tasks assessed  Exercises Other Exercises Other Exercises: standing squats x10, sit-stand x1. BUE on counter, cues for technique Other Exercises: standing calf raise 2 x 10 with BUE support on counter for balance    General Comments        Pertinent Vitals/Pain Pain Assessment: 0-10 Pain Score: 3  Pain Location: LLE Pain Descriptors / Indicators: Sore Pain Intervention(s): Limited activity within patient's  tolerance;Monitored during session;Repositioned           PT Goals (current goals can now be found in the care plan section) Acute Rehab PT Goals Patient Stated Goal: for LLE to heal properly PT Goal Formulation: With patient Time For Goal Achievement: 05/27/20 Potential to Achieve Goals: Good Progress towards PT goals: Progressing toward goals    Frequency    Min 3X/week      PT Plan Frequency needs to be updated       AM-PAC PT "6 Clicks" Mobility   Outcome Measure  Help needed turning from your back to your side while in a flat bed without using bedrails?: None Help needed moving from lying on your back to sitting on the side of a flat bed without using bedrails?: None Help needed moving to and from a bed to a chair (including a wheelchair)?: None Help needed standing up from a chair using your arms (e.g., wheelchair or bedside chair)?: None Help needed to walk in hospital room?: None Help needed climbing 3-5 steps with a railing? : A Little 6 Click Score: 23    End of Session Equipment Utilized During Treatment: Gait belt Activity Tolerance: Patient tolerated treatment well Patient left: in chair;with call bell/phone within reach Nurse Communication: Mobility status PT Visit Diagnosis: Unsteadiness on feet (R26.81);Muscle weakness (generalized) (M62.81)     Time: 1428-1500 PT Time Calculation (min) (ACUTE ONLY): 32 min  Charges:  $Gait Training: 8-22 mins $Therapeutic Exercise: 8-22 mins                     Rolm Baptise, PT, DPT   Acute Rehabilitation Department Pager #: [phone]   Gaetana Michaelis 05/17/2020, 6:10 PM

## 2020-05-17 NOTE — Progress Notes (Signed)
°  Echocardiogram 2D Echocardiogram has been performed.  Travis Mayo 05/17/2020, 2:19 PM

## 2020-05-17 NOTE — Progress Notes (Signed)
Patient is postop day 5 status post irrigation and debridement of ankle wound with application of installation wound VAC.  He is lying in bed comfortably alert and awake.  He does state that he often feels like he has to sleep more.  He also notices some exudate when he closes his eyes for a while.  He denies any itching or irritation.  He does have some visual issues but these are related to cataracts.  He was supposed to have cataract surgery on Wednesday of last week  Vital signs stable afebrile cultures have grown pansensitive MSSA.  Wound VAC is functioning currently.  Apparently there was some difficulties it over the weekend   Status post above.  Plan will return to the operating room on Friday.  Wound VAC dressing will be changed today but sponges will remain in place.

## 2020-05-18 DIAGNOSIS — I340 Nonrheumatic mitral (valve) insufficiency: Secondary | ICD-10-CM

## 2020-05-18 DIAGNOSIS — A4901 Methicillin susceptible Staphylococcus aureus infection, unspecified site: Secondary | ICD-10-CM

## 2020-05-18 LAB — CBC WITH DIFFERENTIAL/PLATELET
Abs Immature Granulocytes: 0.02 10*3/uL (ref 0.00–0.07)
Basophils Absolute: 0.1 10*3/uL (ref 0.0–0.1)
Basophils Relative: 1 %
Eosinophils Absolute: 0.1 10*3/uL (ref 0.0–0.5)
Eosinophils Relative: 2 %
HCT: 30 % — ABNORMAL LOW (ref 39.0–52.0)
Hemoglobin: 9.6 g/dL — ABNORMAL LOW (ref 13.0–17.0)
Immature Granulocytes: 0 %
Lymphocytes Relative: 12 %
Lymphs Abs: 0.9 10*3/uL (ref 0.7–4.0)
MCH: 27.7 pg (ref 26.0–34.0)
MCHC: 32 g/dL (ref 30.0–36.0)
MCV: 86.5 fL (ref 80.0–100.0)
Monocytes Absolute: 0.7 10*3/uL (ref 0.1–1.0)
Monocytes Relative: 10 %
Neutro Abs: 5.5 10*3/uL (ref 1.7–7.7)
Neutrophils Relative %: 75 %
Platelets: 584 10*3/uL — ABNORMAL HIGH (ref 150–400)
RBC: 3.47 MIL/uL — ABNORMAL LOW (ref 4.22–5.81)
RDW: 13.2 % (ref 11.5–15.5)
WBC: 7.4 10*3/uL (ref 4.0–10.5)
nRBC: 0 % (ref 0.0–0.2)

## 2020-05-18 LAB — AEROBIC/ANAEROBIC CULTURE W GRAM STAIN (SURGICAL/DEEP WOUND): Gram Stain: NONE SEEN

## 2020-05-18 LAB — BASIC METABOLIC PANEL
Anion gap: 13 (ref 5–15)
BUN: 7 mg/dL (ref 6–20)
CO2: 21 mmol/L — ABNORMAL LOW (ref 22–32)
Calcium: 9.3 mg/dL (ref 8.9–10.3)
Chloride: 107 mmol/L (ref 98–111)
Creatinine, Ser: 0.97 mg/dL (ref 0.61–1.24)
GFR calc Af Amer: >60 mL/min (ref >60)
GFR calc non Af Amer: >60 mL/min (ref >60)
Glucose, Bld: 90 mg/dL (ref 70–99)
Potassium: 3.5 mmol/L (ref 3.5–5.1)
Sodium: 141 mmol/L (ref 135–145)

## 2020-05-18 MED ORDER — CHLORHEXIDINE GLUCONATE CLOTH 2 % EX PADS
6.0000 | MEDICATED_PAD | Freq: Every day | CUTANEOUS | Status: DC
  Administered 2020-05-18 – 2020-05-22 (×5): 6 via TOPICAL

## 2020-05-18 MED ORDER — SODIUM CHLORIDE 0.9% FLUSH
10.0000 mL | INTRAVENOUS | Status: DC | PRN
  Administered 2020-05-19: 10 mL

## 2020-05-18 MED ORDER — SODIUM CHLORIDE 0.9% FLUSH
10.0000 mL | Freq: Two times a day (BID) | INTRAVENOUS | Status: DC
  Administered 2020-05-18: 10 mL

## 2020-05-18 MED ORDER — SODIUM CHLORIDE 0.9 % IV SOLN: INTRAVENOUS | Status: DC

## 2020-05-18 NOTE — Progress Notes (Signed)
Patient is postop day 6 status post irrigation debridement ankle wound with placement installation VAC.  Wound VAC was changed yesterday per patient.  Currently wound vacs functioning. Vital signs stable patient is alert sitting in bed.  Pain is well controlled   Discussed with patient plan will be to return to the operating room on Friday for irrigation debridement.  Dr. Lajoyce Corners will see him on Thursday

## 2020-05-18 NOTE — Progress Notes (Signed)
Occupational Therapy Treatment Patient Details Name: Travis Mayo MRN: [id number] DOB: [birth year] Today's Date: 05/18/2020    History of present illness Pt is 59 yo male who is 6 weeks s/p L tibial calcaneal fusion who presented with acute pain, swelling, and drainage - admitted with infection.  Pt is s/p I and D of L ankle with removal of deep hardward and placement of installation wound vac on 05/12/20.  Reports plan for further surgery next week to close wound.   OT comments  Pt has been sponge bathing and dressing with set up seated at sink which was his method at home due to his shower being on the second floor of his home. Pt requires supervision for OOB to manage lines. Will continue to follow.  Follow Up Recommendations  No OT follow up    Equipment Recommendations  None recommended by OT    Recommendations for Other Services      Precautions / Restrictions Precautions Precautions: Fall Precaution Comments: wound vac Restrictions LLE Weight Bearing: Non weight bearing Other Position/Activity Restrictions: CAM boot for L LE       Mobility Bed Mobility Overal bed mobility: Modified Independent             General bed mobility comments: HOB up  Transfers Overall transfer level: Needs assistance Equipment used: Rolling walker (2 wheeled) Transfers: Sit to/from Stand Sit to Stand: Supervision         General transfer comment: able to maintain NWB on L LE, assist only for lines    Balance Overall balance assessment: Needs assistance   Sitting balance-Leahy Scale: Normal     Standing balance support: Bilateral upper extremity supported;No upper extremity supported Standing balance-Leahy Scale: Fair                             ADL either performed or assessed with clinical judgement   ADL                                         General ADL Comments: pt has been sponge bathing on 3 in 1 at sink with set up of nursing staff,  ambulating with bathroom with supervision/assist to manage lines using RW     Vision       Perception     Praxis      Cognition Arousal/Alertness: Awake/alert Behavior During Therapy: WFL for tasks assessed/performed Overall Cognitive Status: Within Functional Limits for tasks assessed                                          Exercises     Shoulder Instructions       General Comments      Pertinent Vitals/ Pain       Pain Assessment: Faces Faces Pain Scale: Hurts little more Pain Location: LLE Pain Descriptors / Indicators: Sore Pain Intervention(s): Repositioned  Home Living                                          Prior Functioning/Environment              Frequency  Min 2X/week  Progress Toward Goals  OT Goals(current goals can now be found in the care plan section)  Progress towards OT goals: Progressing toward goals  Acute Rehab OT Goals Patient Stated Goal: for LLE to heal properly OT Goal Formulation: With patient Time For Goal Achievement: 05/27/20 Potential to Achieve Goals: Good  Plan Discharge plan remains appropriate    Co-evaluation                 AM-PAC OT "6 Clicks" Daily Activity     Outcome Measure   Help from another person eating meals?: None Help from another person taking care of personal grooming?: A Little Help from another person toileting, which includes using toliet, bedpan, or urinal?: A Little Help from another person bathing (including washing, rinsing, drying)?: None Help from another person to put on and taking off regular upper body clothing?: None Help from another person to put on and taking off regular lower body clothing?: None 6 Click Score: 22    End of Session Equipment Utilized During Treatment: Rolling walker  OT Visit Diagnosis: Other abnormalities of gait and mobility (R26.89);Muscle weakness (generalized) (M62.81);Pain Pain - Right/Left:  Left Pain - part of body: Leg   Activity Tolerance Patient tolerated treatment well   Patient Left in bed;with call bell/phone within reach   Nurse Communication          Time: 1451-1511 OT Time Calculation (min): 20 min  Charges: OT General Charges $OT Visit: 1 Visit OT Treatments $Self Care/Home Management : 8-22 mins  Martie Round, OTR/L Acute Rehabilitation Services Pager: [phone] Office: [phone]   Evern Bio 05/18/2020, 3:29 PM

## 2020-05-18 NOTE — Progress Notes (Signed)
[facility]  Date of Admission:  05/12/2020       Total days of antibiotics 6  Cefazolin     ASSESSMENT: Travis Mayo is a 59 y.o. 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 Now s/p hardware removal after attempted fusion. MSSA from cultures on OR with fungal and AFB pending.   He has mitral valve systolic murmur and some changes on transthoracic echo. I think given chronicity of this infection we should pursue TEE to better characterize and ensure that there is no damage due to subacute endocarditis. Discussed this with him today and he will consider tonight. Will try to get this arranged for him prior to Friday ortho procedure.     PLAN: 1. Continue Cefazolin  2. OPAT as outlined in previous note 3. He will decide if we can do TEE    Active Problems:   Hardware complicating wound infection (HCC)   Abscess of bursa of left ankle   MSSA (methicillin susceptible Staphylococcus aureus) infection   . Chlorhexidine Gluconate Cloth  6 each Topical Daily  . docusate sodium  100 mg Oral BID  . mupirocin ointment  1 application Nasal BID  . sodium chloride flush  10-40 mL Intracatheter Q12H    SUBJECTIVE: Doing well - no complaints.   Review of Systems: Review of Systems  Constitutional: Negative for chills and fever.  HENT: Negative for tinnitus.   Eyes: Negative for blurred vision and photophobia.  Respiratory: Negative for cough and sputum production.   Cardiovascular: Negative for chest pain.  Gastrointestinal: Negative for diarrhea, nausea and vomiting.  Genitourinary: Negative for dysuria.  Musculoskeletal: Positive for joint pain.  Skin: Negative for rash.  Neurological: Negative for headaches.    No Known Allergies  OBJECTIVE: Vitals:   05/17/20 1439 05/17/20 2300 05/18/20 0326 05/18/20 0733  BP: 133/84 134/84 134/82 139/80  Pulse: 92 95 89 97  Resp: 18 17 17  17   Temp: 98.1 F (36.7 C) 98.2 F (36.8 C) 98 F (36.7 C) 98 F (36.7 C)  TempSrc: Oral Oral Oral Oral  SpO2: 99% 98% 97% 94%  Weight:      Height:       Body mass index is 28.48 kg/m.  Physical Exam Constitutional:      Appearance: Normal appearance. He is not ill-appearing.  HENT:     Head: Normocephalic.     Mouth/Throat:     Mouth: Mucous membranes are moist.     Pharynx: Oropharynx is clear.  Eyes:     General: No scleral icterus. Cardiovascular:     Rate and Rhythm: Normal rate.  Pulmonary:     Effort: Pulmonary effort is normal.  Musculoskeletal:        General: Normal range of motion.     Cervical back: Normal range of motion.     Comments: Left foot with instillation vac in place. Significant swelling noted. No drainage.   Skin:    Capillary Refill: Capillary refill takes less than 2 seconds.     Coloration: Skin is not jaundiced or pale.  Neurological:     Mental Status: He is alert and oriented to person, place, and time.  Psychiatric:        Mood and Affect: Mood normal.        Judgment: Judgment normal.     Lab Results Lab Results  Component Value Date   WBC 7.4 05/18/2020  HGB 9.6 (L) 05/18/2020   HCT 30.0 (L) 05/18/2020   MCV 86.5 05/18/2020   PLT 584 (H) 05/18/2020    Lab Results  Component Value Date   CREATININE 0.89 05/12/2020   BUN 10 05/12/2020   NA 136 05/12/2020   K 3.3 (L) 05/12/2020   CL 102 05/12/2020   CO2 19 (L) 05/12/2020    Lab Results  Component Value Date   ALT 15 05/12/2020   AST 21 05/12/2020   ALKPHOS 59 05/12/2020   BILITOT 0.4 05/12/2020     Microbiology: Recent Results (from the past 240 hour(s))  SARS CORONAVIRUS 2 (TAT 6-24 HRS) Nasopharyngeal Nasopharyngeal Swab     Status: None   Collection Time: 05/11/20 10:33 AM   Specimen: Nasopharyngeal Swab  Result Value Ref Range Status   SARS Coronavirus 2 NEGATIVE NEGATIVE Final    Comment: (NOTE) SARS-CoV-2 target nucleic acids are NOT DETECTED.  The  SARS-CoV-2 RNA is generally detectable in upper and lower respiratory specimens during the acute phase of infection. Negative results do not preclude SARS-CoV-2 infection, do not rule out co-infections with other pathogens, and should not be used as the sole basis for treatment or other patient management decisions. Negative results must be combined with clinical observations, patient history, and epidemiological information. The expected result is Negative.  Fact Sheet for Patients: HairSlick.no  Fact Sheet for Healthcare Providers: quierodirigir.com  This test is not yet approved or cleared by the [place] FDA and  has been authorized for detection and/or diagnosis of SARS-CoV-2 by FDA under an Emergency Use Authorization (EUA). This EUA will remain  in effect (meaning this test can be used) for the duration of the COVID-19 declaration under Se ction 564(b)(1) of the Act, 21 U.S.C. section 360bbb-3(b)(1), unless the authorization is terminated or revoked sooner.  Performed at [facility], [address], [place], [place] [address]   Surgical pcr screen     Status: Abnormal   Collection Time: 05/12/20  2:56 PM   Specimen: Nasal Mucosa; Nasal Swab  Result Value Ref Range Status   MRSA, PCR NEGATIVE NEGATIVE Final   Staphylococcus aureus POSITIVE (A) NEGATIVE Final    Comment: (NOTE) The Xpert SA Assay (FDA approved for NASAL specimens in patients 65 years of age and older), is one component of a comprehensive surveillance program. It is not intended to diagnose infection nor to guide or monitor treatment. Performed at [facility], [address] [place], [place] [address]   Aerobic/Anaerobic Culture (surgical/deep wound)     Status: None   Collection Time: 05/12/20  7:56 PM   Specimen: PATH Other; Tissue  Result Value Ref Range Status   Specimen Description TISSUE  Final   Special Requests LEFT ANKLE   Final   Gram Stain NO WBC SEEN RARE GRAM POSITIVE COCCI IN PAIRS   Final   Culture   Final    MODERATE STAPHYLOCOCCUS AUREUS NO ANAEROBES ISOLATED Performed at [facility], [address], [place], [place] [address]    Report Status 05/18/2020 FINAL  Final   Organism ID, Bacteria STAPHYLOCOCCUS AUREUS  Final      Susceptibility   Staphylococcus aureus - MIC*    CIPROFLOXACIN <=0.5 SENSITIVE Sensitive     ERYTHROMYCIN <=0.25 SENSITIVE Sensitive     GENTAMICIN <=0.5 SENSITIVE Sensitive     OXACILLIN 0.5 SENSITIVE Sensitive     TETRACYCLINE <=1 SENSITIVE Sensitive     VANCOMYCIN <=0.5 SENSITIVE Sensitive  TRIMETH/SULFA <=10 SENSITIVE Sensitive     CLINDAMYCIN <=0.25 SENSITIVE Sensitive     RIFAMPIN <=0.5 SENSITIVE Sensitive     Inducible Clindamycin NEGATIVE Sensitive     * MODERATE STAPHYLOCOCCUS AUREUS  Acid Fast Smear (AFB)     Status: None   Collection Time: 05/13/20  9:20 AM   Specimen: Ankle; Tissue  Result Value Ref Range Status   AFB Specimen Processing Comment  Final    Comment: Tissue Grinding and Digestion/Decontamination   Acid Fast Smear Negative  Final    Comment: (NOTE) Performed At: [facility] [address] [place], [place] [id number] Jolene Schimke MD KG:[id number]    Source (AFB) TISSUE  Final    Comment: LEFT ANKLE SPEC A Performed at [facility], [address], [place], [place] [address]     Rexene Alberts, MSN, NP-C [facility]  [email or web address] Pager: [phone] Office: [phone] [facility] Main Line: [phone]

## 2020-05-18 NOTE — Plan of Care (Signed)

## 2020-05-18 NOTE — Progress Notes (Signed)
Peripherally Inserted Central Catheter Placement  The IV Nurse has discussed with the patient and/or persons authorized to consent for the patient, the purpose of this procedure and the potential benefits and risks involved with this procedure.  The benefits include less needle sticks, lab draws from the catheter, and the patient may be discharged home with the catheter. Risks include, but not limited to, infection, bleeding, blood clot (thrombus formation), and puncture of an artery; nerve damage and irregular heartbeat and possibility to perform a PICC exchange if needed/ordered by physician.  Alternatives to this procedure were also discussed.  Bard Power PICC patient education guide, fact sheet on infection prevention and patient information card has been provided to patient /or left at bedside.    PICC Placement Documentation  PICC Single Lumen 05/18/20 PICC Right Basilic 41 cm 1 cm (Active)  Indication for Insertion or Continuance of Line Home intravenous therapies (PICC only) 05/18/20 1210  Exposed Catheter (cm) 0 cm 05/18/20 1210  Site Assessment Clean;Dry;Intact 05/18/20 1210  Line Status Flushed;Saline locked;Blood return noted 05/18/20 1210  Dressing Type Transparent;Securing device 05/18/20 1210  Dressing Status Clean;Dry;Intact;Antimicrobial disc in place 05/18/20 1210  Safety Lock Not Applicable 05/18/20 1210  Dressing Intervention New dressing 05/18/20 1210  Dressing Change Due 05/25/20 05/18/20 1210       Annett Fabian 05/18/2020, 12:12 PM

## 2020-05-18 NOTE — Progress Notes (Signed)
    [facility] has been requested to perform a transesophageal echocardiogram on Travis Mayo for bacteremia.  After careful review of history and examination, the risks and benefits of transesophageal echocardiogram have been explained including risks of esophageal damage, perforation (1:10,000 risk), bleeding, pharyngeal hematoma as well as other potential complications associated with conscious sedation including aspiration, arrhythmia, respiratory failure and death. Alternatives to treatment were discussed, questions were answered. Patient is willing to proceed.   Georgie Chard, NP  05/18/2020 5:43 PM

## 2020-05-18 NOTE — Plan of Care (Signed)

## 2020-05-18 NOTE — H&P (View-Only) (Signed)
[facility]  Date of Admission:  05/12/2020       Total days of antibiotics 6  Cefazolin     ASSESSMENT: Travis Mayo is a 59 y.o. 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 Now s/p hardware removal after attempted fusion. MSSA from cultures on OR with fungal and AFB pending.   He has mitral valve systolic murmur and some changes on transthoracic echo. I think given chronicity of this infection we should pursue TEE to better characterize and ensure that there is no damage due to subacute endocarditis. Discussed this with him today and he will consider tonight. Will try to get this arranged for him prior to Friday ortho procedure.     PLAN: 1. Continue Cefazolin  2. OPAT as outlined in previous note 3. He will decide if we can do TEE    Active Problems:   Hardware complicating wound infection (HCC)   Abscess of bursa of left ankle   MSSA (methicillin susceptible Staphylococcus aureus) infection   . Chlorhexidine Gluconate Cloth  6 each Topical Daily  . docusate sodium  100 mg Oral BID  . mupirocin ointment  1 application Nasal BID  . sodium chloride flush  10-40 mL Intracatheter Q12H    SUBJECTIVE: Doing well - no complaints.   Review of Systems: Review of Systems  Constitutional: Negative for chills and fever.  HENT: Negative for tinnitus.   Eyes: Negative for blurred vision and photophobia.  Respiratory: Negative for cough and sputum production.   Cardiovascular: Negative for chest pain.  Gastrointestinal: Negative for diarrhea, nausea and vomiting.  Genitourinary: Negative for dysuria.  Musculoskeletal: Positive for joint pain.  Skin: Negative for rash.  Neurological: Negative for headaches.    No Known Allergies  OBJECTIVE: Vitals:   05/17/20 1439 05/17/20 2300 05/18/20 0326 05/18/20 0733  BP: 133/84 134/84 134/82 139/80  Pulse: 92 95 89 97  Resp: 18 17 17  17   Temp: 98.1 F (36.7 C) 98.2 F (36.8 C) 98 F (36.7 C) 98 F (36.7 C)  TempSrc: Oral Oral Oral Oral  SpO2: 99% 98% 97% 94%  Weight:      Height:       Body mass index is 28.48 kg/m.  Physical Exam Constitutional:      Appearance: Normal appearance. He is not ill-appearing.  HENT:     Head: Normocephalic.     Mouth/Throat:     Mouth: Mucous membranes are moist.     Pharynx: Oropharynx is clear.  Eyes:     General: No scleral icterus. Cardiovascular:     Rate and Rhythm: Normal rate.  Pulmonary:     Effort: Pulmonary effort is normal.  Musculoskeletal:        General: Normal range of motion.     Cervical back: Normal range of motion.     Comments: Left foot with instillation vac in place. Significant swelling noted. No drainage.   Skin:    Capillary Refill: Capillary refill takes less than 2 seconds.     Coloration: Skin is not jaundiced or pale.  Neurological:     Mental Status: He is alert and oriented to person, place, and time.  Psychiatric:        Mood and Affect: Mood normal.        Judgment: Judgment normal.     Lab Results Lab Results  Component Value Date   WBC 7.4 05/18/2020  HGB 9.6 (L) 05/18/2020   HCT 30.0 (L) 05/18/2020   MCV 86.5 05/18/2020   PLT 584 (H) 05/18/2020    Lab Results  Component Value Date   CREATININE 0.89 05/12/2020   BUN 10 05/12/2020   NA 136 05/12/2020   K 3.3 (L) 05/12/2020   CL 102 05/12/2020   CO2 19 (L) 05/12/2020    Lab Results  Component Value Date   ALT 15 05/12/2020   AST 21 05/12/2020   ALKPHOS 59 05/12/2020   BILITOT 0.4 05/12/2020     Microbiology: Recent Results (from the past 240 hour(s))  SARS CORONAVIRUS 2 (TAT 6-24 HRS) Nasopharyngeal Nasopharyngeal Swab     Status: None   Collection Time: 05/11/20 10:33 AM   Specimen: Nasopharyngeal Swab  Result Value Ref Range Status   SARS Coronavirus 2 NEGATIVE NEGATIVE Final    Comment: (NOTE) SARS-CoV-2 target nucleic acids are NOT DETECTED.  The  SARS-CoV-2 RNA is generally detectable in upper and lower respiratory specimens during the acute phase of infection. Negative results do not preclude SARS-CoV-2 infection, do not rule out co-infections with other pathogens, and should not be used as the sole basis for treatment or other patient management decisions. Negative results must be combined with clinical observations, patient history, and epidemiological information. The expected result is Negative.  Fact Sheet for Patients: HairSlick.no  Fact Sheet for Healthcare Providers: quierodirigir.com  This test is not yet approved or cleared by the [place] FDA and  has been authorized for detection and/or diagnosis of SARS-CoV-2 by FDA under an Emergency Use Authorization (EUA). This EUA will remain  in effect (meaning this test can be used) for the duration of the COVID-19 declaration under Se ction 564(b)(1) of the Act, 21 U.S.C. section 360bbb-3(b)(1), unless the authorization is terminated or revoked sooner.  Performed at [facility], [address], [place], [place] [address]   Surgical pcr screen     Status: Abnormal   Collection Time: 05/12/20  2:56 PM   Specimen: Nasal Mucosa; Nasal Swab  Result Value Ref Range Status   MRSA, PCR NEGATIVE NEGATIVE Final   Staphylococcus aureus POSITIVE (A) NEGATIVE Final    Comment: (NOTE) The Xpert SA Assay (FDA approved for NASAL specimens in patients 65 years of age and older), is one component of a comprehensive surveillance program. It is not intended to diagnose infection nor to guide or monitor treatment. Performed at [facility], [address] [place], [place] [address]   Aerobic/Anaerobic Culture (surgical/deep wound)     Status: None   Collection Time: 05/12/20  7:56 PM   Specimen: PATH Other; Tissue  Result Value Ref Range Status   Specimen Description TISSUE  Final   Special Requests LEFT ANKLE   Final   Gram Stain NO WBC SEEN RARE GRAM POSITIVE COCCI IN PAIRS   Final   Culture   Final    MODERATE STAPHYLOCOCCUS AUREUS NO ANAEROBES ISOLATED Performed at [facility], [address], [place], [place] [address]    Report Status 05/18/2020 FINAL  Final   Organism ID, Bacteria STAPHYLOCOCCUS AUREUS  Final      Susceptibility   Staphylococcus aureus - MIC*    CIPROFLOXACIN <=0.5 SENSITIVE Sensitive     ERYTHROMYCIN <=0.25 SENSITIVE Sensitive     GENTAMICIN <=0.5 SENSITIVE Sensitive     OXACILLIN 0.5 SENSITIVE Sensitive     TETRACYCLINE <=1 SENSITIVE Sensitive     VANCOMYCIN <=0.5 SENSITIVE Sensitive  TRIMETH/SULFA <=10 SENSITIVE Sensitive     CLINDAMYCIN <=0.25 SENSITIVE Sensitive     RIFAMPIN <=0.5 SENSITIVE Sensitive     Inducible Clindamycin NEGATIVE Sensitive     * MODERATE STAPHYLOCOCCUS AUREUS  Acid Fast Smear (AFB)     Status: None   Collection Time: 05/13/20  9:20 AM   Specimen: Ankle; Tissue  Result Value Ref Range Status   AFB Specimen Processing Comment  Final    Comment: Tissue Grinding and Digestion/Decontamination   Acid Fast Smear Negative  Final    Comment: (NOTE) Performed At: [facility] [address] [place], [place] [id number] Nagendra Sanjai MD Ph:[phone]    Source (AFB) TISSUE  Final    Comment: LEFT ANKLE SPEC A Performed at [facility], [address] [place],  [address]     Shenekia Riess, MSN, NP-C [facility]  [email or web address] Pager: [phone] Office: [phone] [facility] Main Line: [phone]  

## 2020-05-19 ENCOUNTER — Inpatient Hospital Stay (HOSPITAL_COMMUNITY): Payer: Self-pay

## 2020-05-19 ENCOUNTER — Inpatient Hospital Stay: Payer: Self-pay | Admitting: Physician Assistant

## 2020-05-19 ENCOUNTER — Encounter (HOSPITAL_COMMUNITY): Payer: Self-pay | Admitting: Orthopedic Surgery

## 2020-05-19 ENCOUNTER — Encounter (HOSPITAL_COMMUNITY)
Admission: RE | Disposition: A | Discharge status: Home Health | Payer: Self-pay | Source: Home / Self Care | Attending: Orthopedic Surgery

## 2020-05-19 DIAGNOSIS — B9561 Methicillin susceptible Staphylococcus aureus infection as the cause of diseases classified elsewhere: Secondary | ICD-10-CM

## 2020-05-19 DIAGNOSIS — M86172 Other acute osteomyelitis, left ankle and foot: Secondary | ICD-10-CM

## 2020-05-19 SURGERY — CANCELLED PROCEDURE
Anesthesia: Monitor Anesthesia Care

## 2020-05-19 NOTE — Progress Notes (Signed)
Procedure canceled - pt reported to MD that he chokes on yogurt.  Cardiology will continue to follow.

## 2020-05-19 NOTE — TOC Progression Note (Addendum)
Transition of Care [facility]) - Progression Note    Patient Details  Name: Arnulfo Batson MRN: [id number] Date of Birth: [birth year]  Transition of Care [facility]) CM/SW Contact  Janae Bridgeman, RN Phone Number: 05/19/2020, 12:22 PM  Clinical Narrative:    Case management spoke with [facility] and they are willing to accept this patient for private pay for RN for labs and dressing changes for the PICC.  I spoke with Boneta Lucks and Noreene Larsson with [facility] and clinicals faxed to fax # [phone].  Jeri Modena, RNCM with [facility] is following the patient for IV antibiotic therapy coordination.  I spoke with the patient and informed him of the above Home Health companies that will be providing him support for his IV antibiotics and infusions.   Expected Discharge Plan: Home w Home Health Services Barriers to Discharge: Continued Medical Work up  Expected Discharge Plan and Services Expected Discharge Plan: Home w Home Health Services   Discharge Planning Services: CM Consult Post Acute Care Choice: Home Health Living arrangements for the past 2 months: Apartment                                       Social Determinants of Health (SDOH) Interventions    Readmission Risk Interventions Readmission Risk Prevention Plan 05/17/2020  Post Dischage Appt Complete  Medication Screening Complete  Transportation Screening Complete  Some recent data might be hidden

## 2020-05-19 NOTE — Interval H&P Note (Deleted)
History and Physical Interval Note:  05/19/2020 11:36 AM  Travis Mayo  has presented today for surgery, with the diagnosis of BACTEREMIA.  The various methods of treatment have been discussed with the patient and family. After consideration of risks, benefits and other options for treatment, the patient has consented to  Procedure(s): TRANSESOPHAGEAL ECHOCARDIOGRAM (TEE) (N/A) as a surgical intervention.  The patient's history has been reviewed, patient examined, no change in status, stable for surgery.  I have reviewed the patient's chart and labs.  Questions were answered to the patient's satisfaction.     Parke Poisson

## 2020-05-19 NOTE — Progress Notes (Signed)
Physical Therapy Treatment Patient Details Name: Travis Mayo MRN: [id number] DOB: 1961/07/08 Today's Date: 05/19/2020    History of Present Illness Pt is 59 yo male who is 6 weeks s/p L tibial calcaneal fusion who presented with acute pain, swelling, and drainage - admitted with infection.  Pt is s/p I and D of L ankle with removal of deep hardward and placement of installation wound vac on 05/12/20.  Reports plan for further surgery next week to close wound.    PT Comments    Pt supine in bed on arrival this session. Pt reports he has been up and down a fewtimes today and after wound vac change he would prefer to stay in bed.  He was agreeable to supine LE strengthening.  Plan for stair training next session.  No PT follow up remains appropriate.   Follow Up Recommendations  No PT follow up     Equipment Recommendations  3in1 (PT)    Recommendations for Other Services       Precautions / Restrictions Precautions Precautions: Fall Precaution Comments: wound vac Restrictions Weight Bearing Restrictions: Yes LLE Weight Bearing: Non weight bearing Other Position/Activity Restrictions: CAM boot for L LE- he continues to refused to wear cam boot    Mobility  Bed Mobility               General bed mobility comments: Pt reports being up and around room today alot but agreeable to supine therapeutic exercises.  Transfers                    Ambulation/Gait                 Stairs             Wheelchair Mobility    Modified Rankin (Stroke Patients Only)       Balance                                            Cognition Arousal/Alertness: Awake/alert Behavior During Therapy: WFL for tasks assessed/performed Overall Cognitive Status: Within Functional Limits for tasks assessed                                        Exercises General Exercises - Lower Extremity Ankle Circles/Pumps: AROM;Both;10  reps;Supine (toe wiggles on L side.) Quad Sets: AROM;Both;10 reps;Supine Heel Slides: AROM;Both;10 reps;Supine Hip ABduction/ADduction: AROM;Both;10 reps;Supine Straight Leg Raises: AROM;Both;10 reps;Supine    General Comments        Pertinent Vitals/Pain Pain Assessment: 0-10 Pain Score: 3  Pain Location: LLE Pain Descriptors / Indicators: Sore Pain Intervention(s): Monitored during session;Repositioned    Home Living                      Prior Function            PT Goals (current goals can now be found in the care plan section) Acute Rehab PT Goals Patient Stated Goal: for LLE to heal properly Potential to Achieve Goals: Good Progress towards PT goals: Progressing toward goals    Frequency    Min 3X/week      PT Plan Frequency needs to be updated    Co-evaluation  AM-PAC PT "6 Clicks" Mobility   Outcome Measure  Help needed turning from your back to your side while in a flat bed without using bedrails?: None Help needed moving from lying on your back to sitting on the side of a flat bed without using bedrails?: None Help needed moving to and from a bed to a chair (including a wheelchair)?: None Help needed standing up from a chair using your arms (e.g., wheelchair or bedside chair)?: None Help needed to walk in hospital room?: None Help needed climbing 3-5 steps with a railing? : A Little 6 Click Score: 23    End of Session Equipment Utilized During Treatment: Gait belt Activity Tolerance: Patient tolerated treatment well Patient left: in chair;with call bell/phone within reach Nurse Communication: Mobility status PT Visit Diagnosis: Unsteadiness on feet (R26.81);Muscle weakness (generalized) (M62.81)     Time: 2563-8937 PT Time Calculation (min) (ACUTE ONLY): 10 min  Charges:  $Therapeutic Exercise: 8-22 mins                     Bonney Leitz , PTA Acute Rehabilitation Services Pager [phone] Office  [phone]     Breon Diss Artis Delay 05/19/2020, 4:59 PM

## 2020-05-19 NOTE — Progress Notes (Signed)
S/P  I and D.  Wound vac change 2 days ago.  Comfortable no complaints  VSS afebrile alert pleasant anxious to go home Wound vac functioning.   Patient for TEE today to ensure evidence of cardiac infection.

## 2020-05-19 NOTE — Progress Notes (Signed)
[facility]  Date of Admission:  05/12/2020       Total days of antibiotics 7  Cefazolin     ASSESSMENT: Travis Mayo is a 59 y.o. 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 Now s/p hardware removal after attempted fusion. MSSA from cultures on OR with fungal and AFB pending.  Swelling appears a bit better. Waiting to go back for another surgery Friday. Unable to do TEE due to "lazy esophagus" and no previous GI eval. He does not want to pursue that outpatient either.   His plan for outpatient antibiotics including follow up with ID has been arranged.  Available as needed for duration of hospitalization should something change with his condition or new OR findings - otherwise will see him at outpatient follow up.    PLAN: 1. Continue Cefazolin  2. OPAT as outlined in previous note   Active Problems:   Hardware complicating wound infection (HCC)   Abscess of bursa of left ankle   MSSA (methicillin susceptible Staphylococcus aureus) infection   Cardiac murmur due to mitral valve disorder   . Chlorhexidine Gluconate Cloth  6 each Topical Daily  . docusate sodium  100 mg Oral BID  . sodium chloride flush  10-40 mL Intracatheter Q12H    SUBJECTIVE: Doing well - no complaints.   Review of Systems: Review of Systems  Constitutional: Negative for chills and fever.  HENT: Negative for tinnitus.   Eyes: Negative for blurred vision and photophobia.  Respiratory: Negative for cough and sputum production.   Cardiovascular: Negative for chest pain.  Gastrointestinal: Negative for diarrhea, nausea and vomiting.  Genitourinary: Negative for dysuria.  Musculoskeletal: Positive for joint pain.  Skin: Negative for rash.  Neurological: Negative for headaches.    No Known Allergies  OBJECTIVE: Vitals:   05/19/20 0422 05/19/20 0830 05/19/20 1050 05/19/20 1300  BP: 132/88 (!) 143/87  (!) 168/85   Pulse: 90 96 (!) 104 95  Resp: 17 18 14 15   Temp: 98.8 F (37.1 C) 98.6 F (37 C) 99 F (37.2 C) 98.3 F (36.8 C)  TempSrc: Oral  Oral Oral  SpO2: 94% 95% 100% 99%  Weight:   95.3 kg   Height:   6' (1.829 m)    Body mass index is 28.48 kg/m.  Physical Exam Constitutional:      Appearance: Normal appearance. He is not ill-appearing.  HENT:     Head: Normocephalic.     Mouth/Throat:     Mouth: Mucous membranes are moist.     Pharynx: Oropharynx is clear.  Eyes:     General: No scleral icterus. Cardiovascular:     Rate and Rhythm: Normal rate.  Pulmonary:     Effort: Pulmonary effort is normal.  Musculoskeletal:        General: Normal range of motion.     Cervical back: Normal range of motion.     Comments: Left foot with instillation vac in place. Significant swelling noted. No drainage.   Skin:    Capillary Refill: Capillary refill takes less than 2 seconds.     Coloration: Skin is not jaundiced or pale.  Neurological:     Mental Status: He is alert and oriented to person, place, and time.  Psychiatric:        Mood and Affect: Mood normal.        Judgment: Judgment normal.     Lab Results  Lab Results  Component Value Date   WBC 7.4 05/18/2020   HGB 9.6 (L) 05/18/2020   HCT 30.0 (L) 05/18/2020   MCV 86.5 05/18/2020   PLT 584 (H) 05/18/2020    Lab Results  Component Value Date   CREATININE 0.97 05/18/2020   BUN 7 05/18/2020   NA 141 05/18/2020   K 3.5 05/18/2020   CL 107 05/18/2020   CO2 21 (L) 05/18/2020    Lab Results  Component Value Date   ALT 15 05/12/2020   AST 21 05/12/2020   ALKPHOS 59 05/12/2020   BILITOT 0.4 05/12/2020     Microbiology: Recent Results (from the past 240 hour(s))  SARS CORONAVIRUS 2 (TAT 6-24 HRS) Nasopharyngeal Nasopharyngeal Swab     Status: None   Collection Time: 05/11/20 10:33 AM   Specimen: Nasopharyngeal Swab  Result Value Ref Range Status   SARS Coronavirus 2 NEGATIVE NEGATIVE Final    Comment:  (NOTE) SARS-CoV-2 target nucleic acids are NOT DETECTED.  The SARS-CoV-2 RNA is generally detectable in upper and lower respiratory specimens during the acute phase of infection. Negative results do not preclude SARS-CoV-2 infection, do not rule out co-infections with other pathogens, and should not be used as the sole basis for treatment or other patient management decisions. Negative results must be combined with clinical observations, patient history, and epidemiological information. The expected result is Negative.  Fact Sheet for Patients: HairSlick.no  Fact Sheet for Healthcare Providers: quierodirigir.com  This test is not yet approved or cleared by the [place] FDA and  has been authorized for detection and/or diagnosis of SARS-CoV-2 by FDA under an Emergency Use Authorization (EUA). This EUA will remain  in effect (meaning this test can be used) for the duration of the COVID-19 declaration under Se ction 564(b)(1) of the Act, 21 U.S.C. section 360bbb-3(b)(1), unless the authorization is terminated or revoked sooner.  Performed at [facility], [address], [place], [place] [address]   Surgical pcr screen     Status: Abnormal   Collection Time: 05/12/20  2:56 PM   Specimen: Nasal Mucosa; Nasal Swab  Result Value Ref Range Status   MRSA, PCR NEGATIVE NEGATIVE Final   Staphylococcus aureus POSITIVE (A) NEGATIVE Final    Comment: (NOTE) The Xpert SA Assay (FDA approved for NASAL specimens in patients 73 years of age and older), is one component of a comprehensive surveillance program. It is not intended to diagnose infection nor to guide or monitor treatment. Performed at [facility], [address] [place], [place] [address]   Fungus Culture With Stain     Status: None (Preliminary result)   Collection Time: 05/12/20  7:56 PM   Specimen: PATH Other; Tissue  Result Value Ref Range Status    Fungus Stain Final report  Final    Comment: (NOTE) Performed At: [facility] [address] [place], [place] [id number] Jolene Schimke MD UM:[id number]    Fungus (Mycology) Culture PENDING  Incomplete   Fungal Source TISSUE  Final    Comment: LEFT ANKLE Performed at [facility], [address], [place], [place] [address]   Aerobic/Anaerobic Culture (surgical/deep wound)     Status: None   Collection Time: 05/12/20  7:56 PM   Specimen: PATH Other; Tissue  Result Value Ref Range Status   Specimen Description TISSUE  Final   Special Requests LEFT ANKLE  Final   Gram Stain NO WBC SEEN RARE GRAM POSITIVE COCCI IN PAIRS  Final   Culture   Final    MODERATE STAPHYLOCOCCUS AUREUS NO ANAEROBES ISOLATED Performed at [facility], [address], [place], [place] [address]    Report Status 05/18/2020 FINAL  Final   Organism ID, Bacteria STAPHYLOCOCCUS AUREUS  Final      Susceptibility   Staphylococcus aureus - MIC*    CIPROFLOXACIN <=0.5 SENSITIVE Sensitive     ERYTHROMYCIN <=0.25 SENSITIVE Sensitive     GENTAMICIN <=0.5 SENSITIVE Sensitive     OXACILLIN 0.5 SENSITIVE Sensitive     TETRACYCLINE <=1 SENSITIVE Sensitive     VANCOMYCIN <=0.5 SENSITIVE Sensitive     TRIMETH/SULFA <=10 SENSITIVE Sensitive     CLINDAMYCIN <=0.25 SENSITIVE Sensitive     RIFAMPIN <=0.5 SENSITIVE Sensitive     Inducible Clindamycin NEGATIVE Sensitive     * MODERATE STAPHYLOCOCCUS AUREUS  Fungus Culture Result     Status: None   Collection Time: 05/12/20  7:56 PM  Result Value Ref Range Status   Result 1 Comment  Final    Comment: (NOTE) KOH/Calcofluor preparation:  no fungus observed. Performed At: [facility] [address] [place], [place] [id number] Jolene Schimke MD YI:[id number]   Acid Fast Smear (AFB)     Status: None   Collection Time: 05/13/20  9:20 AM   Specimen: Ankle; Tissue  Result Value Ref Range Status   AFB Specimen Processing Comment  Final     Comment: Tissue Grinding and Digestion/Decontamination   Acid Fast Smear Negative  Final    Comment: (NOTE) Performed At: [facility] [address] [place], [place] [id number] Jolene Schimke MD BS:[id number]    Source (AFB) TISSUE  Final    Comment: LEFT ANKLE SPEC A Performed at [facility], [address], [place], [place] [address]     Rexene Alberts, MSN, NP-C [facility]  [email or web address] Pager: [phone] Office: [phone] [facility] Main Line: [phone]

## 2020-05-19 NOTE — Anesthesia Preprocedure Evaluation (Deleted)
Anesthesia Evaluation  Patient identified by MRN, date of birth, ID band Patient awake    Reviewed: Allergy & Precautions, NPO status , Patient's Chart, lab work & pertinent test results  Airway Mallampati: II  TM Distance: >3 FB Neck ROM: Full    Dental  (+) Teeth Intact, Dental Advisory Given   Pulmonary Current Smoker and Patient abstained from smoking.,    Pulmonary exam normal breath sounds clear to auscultation       Cardiovascular hypertension,  Rhythm:Regular Rate:Tachycardia     Neuro/Psych negative neurological ROS  negative psych ROS   GI/Hepatic negative GI ROS, Neg liver ROS,   Endo/Other  negative endocrine ROS  Renal/GU negative Renal ROS     Musculoskeletal  (+) Arthritis ,   Abdominal   Peds  Hematology  (+) Blood dyscrasia, anemia ,  BACTEREMIA   Anesthesia Other Findings Day of surgery medications reviewed with the patient.  Reproductive/Obstetrics                             Anesthesia Physical Anesthesia Plan  ASA: II  Anesthesia Plan: MAC   Post-op Pain Management:    Induction: Intravenous  PONV Risk Score and Plan: 1 and Propofol infusion and Treatment may vary due to age or medical condition  Airway Management Planned: Nasal Cannula and Natural Airway  Additional Equipment:   Intra-op Plan:   Post-operative Plan:   Informed Consent: I have reviewed the patients History and Physical, chart, labs and discussed the procedure including the risks, benefits and alternatives for the proposed anesthesia with the patient or authorized representative who has indicated his/her understanding and acceptance.     Dental advisory given  Plan Discussed with: CRNA and Anesthesiologist  Anesthesia Plan Comments:         Anesthesia Quick Evaluation

## 2020-05-20 LAB — SEDIMENTATION RATE: Sed Rate: 106 mm/hr — ABNORMAL HIGH (ref 0–16)

## 2020-05-20 LAB — C-REACTIVE PROTEIN: CRP: 6.8 mg/dL — ABNORMAL HIGH (ref <1.0)

## 2020-05-20 NOTE — Plan of Care (Signed)

## 2020-05-20 NOTE — Plan of Care (Signed)

## 2020-05-20 NOTE — Plan of Care (Signed)

## 2020-05-21 ENCOUNTER — Inpatient Hospital Stay (HOSPITAL_COMMUNITY): Payer: Self-pay | Admitting: Anesthesiology

## 2020-05-21 ENCOUNTER — Encounter (HOSPITAL_COMMUNITY): Payer: Self-pay | Admitting: Orthopedic Surgery

## 2020-05-21 ENCOUNTER — Encounter (HOSPITAL_COMMUNITY)
Admission: RE | Disposition: A | Discharge status: Home Health | Payer: Self-pay | Source: Home / Self Care | Attending: Orthopedic Surgery

## 2020-05-21 DIAGNOSIS — A4901 Methicillin susceptible Staphylococcus aureus infection, unspecified site: Secondary | ICD-10-CM

## 2020-05-21 DIAGNOSIS — T847XXS Infection and inflammatory reaction due to other internal orthopedic prosthetic devices, implants and grafts, sequela: Secondary | ICD-10-CM

## 2020-05-21 DIAGNOSIS — M71072 Abscess of bursa, left ankle and foot: Secondary | ICD-10-CM

## 2020-05-21 HISTORY — PX: I & D EXTREMITY: SHX5045

## 2020-05-21 SURGERY — IRRIGATION AND DEBRIDEMENT EXTREMITY
Anesthesia: General | Laterality: Left

## 2020-05-21 MED ORDER — ONDANSETRON HCL 4 MG/2ML IJ SOLN
INTRAMUSCULAR | Status: AC
  Filled 2020-05-21: qty 2

## 2020-05-21 MED ORDER — MIDAZOLAM HCL 2 MG/2ML IJ SOLN
INTRAMUSCULAR | Status: AC
  Filled 2020-05-21: qty 2

## 2020-05-21 MED ORDER — FENTANYL CITRATE (PF) 250 MCG/5ML IJ SOLN
INTRAMUSCULAR | Status: AC
  Filled 2020-05-21: qty 5

## 2020-05-21 MED ORDER — MIDAZOLAM HCL 5 MG/5ML IJ SOLN
INTRAMUSCULAR | Status: DC | PRN
  Administered 2020-05-21: 2 mg via INTRAVENOUS

## 2020-05-21 MED ORDER — DEXAMETHASONE SODIUM PHOSPHATE 10 MG/ML IJ SOLN
INTRAMUSCULAR | Status: AC
  Filled 2020-05-21: qty 1

## 2020-05-21 MED ORDER — OXYCODONE HCL 5 MG/5ML PO SOLN: 5.0000 mg | Freq: Once | ORAL | Status: DC | PRN

## 2020-05-21 MED ORDER — LACTATED RINGERS IV SOLN: INTRAVENOUS | Status: DC

## 2020-05-21 MED ORDER — MEPERIDINE HCL 25 MG/ML IJ SOLN: 6.2500 mg | INTRAMUSCULAR | Status: DC | PRN

## 2020-05-21 MED ORDER — CEFAZOLIN SODIUM-DEXTROSE 2-4 GM/100ML-% IV SOLN
2.0000 g | INTRAVENOUS | Status: AC
  Administered 2020-05-21: 2 g via INTRAVENOUS

## 2020-05-21 MED ORDER — HYDROMORPHONE HCL 1 MG/ML IJ SOLN: 0.2500 mg | INTRAMUSCULAR | Status: DC | PRN

## 2020-05-21 MED ORDER — PROMETHAZINE HCL 25 MG/ML IJ SOLN: 6.2500 mg | INTRAMUSCULAR | Status: DC | PRN

## 2020-05-21 MED ORDER — CHLORHEXIDINE GLUCONATE 0.12 % MT SOLN
OROMUCOSAL | Status: AC
  Filled 2020-05-21: qty 15

## 2020-05-21 MED ORDER — ORAL CARE MOUTH RINSE: 15.0000 mL | Freq: Once | OROMUCOSAL | Status: AC

## 2020-05-21 MED ORDER — OXYCODONE HCL 5 MG PO TABS: 5.0000 mg | ORAL_TABLET | Freq: Once | ORAL | Status: DC | PRN

## 2020-05-21 MED ORDER — DEXAMETHASONE SODIUM PHOSPHATE 10 MG/ML IJ SOLN
INTRAMUSCULAR | Status: DC | PRN
  Administered 2020-05-21: 10 mg via INTRAVENOUS

## 2020-05-21 MED ORDER — OXYCODONE HCL 5 MG PO TABS: 5.0000 mg | ORAL_TABLET | ORAL | 0 refills | Status: DC | PRN

## 2020-05-21 MED ORDER — ONDANSETRON HCL 4 MG/2ML IJ SOLN
INTRAMUSCULAR | Status: DC | PRN
  Administered 2020-05-21: 4 mg via INTRAVENOUS

## 2020-05-21 MED ORDER — FENTANYL CITRATE (PF) 100 MCG/2ML IJ SOLN
INTRAMUSCULAR | Status: AC
  Filled 2020-05-21: qty 2

## 2020-05-21 MED ORDER — FENTANYL CITRATE (PF) 100 MCG/2ML IJ SOLN
INTRAMUSCULAR | Status: DC | PRN
Start: 2020-05-21 — End: 2020-05-21
  Administered 2020-05-21 (×2): 100 ug via INTRAVENOUS
  Administered 2020-05-21: 50 ug via INTRAVENOUS
  Administered 2020-05-21: 100 ug via INTRAVENOUS

## 2020-05-21 MED ORDER — AMISULPRIDE (ANTIEMETIC) 5 MG/2ML IV SOLN: 10.0000 mg | Freq: Once | INTRAVENOUS | Status: DC | PRN

## 2020-05-21 MED ORDER — LACTATED RINGERS IV SOLN: INTRAVENOUS | Status: DC | PRN

## 2020-05-21 MED ORDER — CHLORHEXIDINE GLUCONATE 0.12 % MT SOLN
15.0000 mL | Freq: Once | OROMUCOSAL | Status: AC
  Administered 2020-05-21: 15 mL via OROMUCOSAL

## 2020-05-21 MED ORDER — SODIUM CHLORIDE 0.9 % IV SOLN: INTRAVENOUS | Status: DC

## 2020-05-21 MED ORDER — PROPOFOL 10 MG/ML IV BOLUS
INTRAVENOUS | Status: DC | PRN
  Administered 2020-05-21: 250 mg via INTRAVENOUS

## 2020-05-21 MED ORDER — CEFAZOLIN IV (FOR PTA / DISCHARGE USE ONLY): 2.0000 g | Freq: Three times a day (TID) | INTRAVENOUS | 0 refills | Status: AC

## 2020-05-21 MED ORDER — LIDOCAINE 2% (20 MG/ML) 5 ML SYRINGE
INTRAMUSCULAR | Status: AC
  Filled 2020-05-21: qty 5

## 2020-05-21 MED ORDER — 0.9 % SODIUM CHLORIDE (POUR BTL) OPTIME
TOPICAL | Status: DC | PRN
  Administered 2020-05-21: 1000 mL

## 2020-05-21 SURGICAL SUPPLY — 38 items
ALLOGRAFT SKIN MESHD 125 SQ CM (Graft) ×1 IMPLANT
BLADE SURG 21 STRL SS (BLADE) ×2 IMPLANT
BNDG COHESIVE 6X5 TAN STRL LF (GAUZE/BANDAGES/DRESSINGS) IMPLANT
BNDG GAUZE ELAST 4 BULKY (GAUZE/BANDAGES/DRESSINGS) ×4 IMPLANT
CANISTER WOUND CARE 500ML ATS (WOUND CARE) ×2 IMPLANT
COVER SURGICAL LIGHT HANDLE (MISCELLANEOUS) ×4 IMPLANT
COVER WAND RF STERILE (DRAPES) IMPLANT
DRAPE DERMATAC (DRAPES) ×4 IMPLANT
DRAPE U-SHAPE 47X51 STRL (DRAPES) ×2 IMPLANT
DRSG ADAPTIC 3X8 NADH LF (GAUZE/BANDAGES/DRESSINGS) ×2 IMPLANT
DURAPREP 26ML APPLICATOR (WOUND CARE) ×2 IMPLANT
ELECT REM PT RETURN 9FT ADLT (ELECTROSURGICAL)
ELECTRODE REM PT RTRN 9FT ADLT (ELECTROSURGICAL) IMPLANT
GAUZE SPONGE 4X4 12PLY STRL (GAUZE/BANDAGES/DRESSINGS) ×2 IMPLANT
GLOVE BIOGEL PI IND STRL 9 (GLOVE) ×1 IMPLANT
GLOVE BIOGEL PI INDICATOR 9 (GLOVE) ×1
GLOVE SURG ORTHO 9.0 STRL STRW (GLOVE) ×2 IMPLANT
GOWN STRL REUS W/ TWL XL LVL3 (GOWN DISPOSABLE) ×2 IMPLANT
GOWN STRL REUS W/TWL XL LVL3 (GOWN DISPOSABLE) ×2
HANDPIECE INTERPULSE COAX TIP (DISPOSABLE)
KIT BASIN OR (CUSTOM PROCEDURE TRAY) ×2 IMPLANT
KIT TURNOVER KIT B (KITS) ×2 IMPLANT
MANIFOLD NEPTUNE II (INSTRUMENTS) ×2 IMPLANT
MICROMATRIX 1000MG (Tissue) ×2 IMPLANT
NS IRRIG 1000ML POUR BTL (IV SOLUTION) ×2 IMPLANT
PACK ORTHO EXTREMITY (CUSTOM PROCEDURE TRAY) ×2 IMPLANT
PAD ARMBOARD 7.5X6 YLW CONV (MISCELLANEOUS) ×4 IMPLANT
PAD NEG PRESSURE SENSATRAC (MISCELLANEOUS) ×2 IMPLANT
SET HNDPC FAN SPRY TIP SCT (DISPOSABLE) IMPLANT
SKIN MESHED 125 SQ CM (Graft) ×2 IMPLANT
SOLUTION PARTIC MCRMTRX 1000MG (Tissue) ×1 IMPLANT
STOCKINETTE IMPERVIOUS 9X36 MD (GAUZE/BANDAGES/DRESSINGS) IMPLANT
SUT ETHILON 2 0 PSLX (SUTURE) ×2 IMPLANT
SWAB COLLECTION DEVICE MRSA (MISCELLANEOUS) ×2 IMPLANT
SWAB CULTURE ESWAB REG 1ML (MISCELLANEOUS) IMPLANT
TOWEL GREEN STERILE (TOWEL DISPOSABLE) ×2 IMPLANT
TUBE CONNECTING 12X1/4 (SUCTIONS) ×2 IMPLANT
YANKAUER SUCT BULB TIP NO VENT (SUCTIONS) ×2 IMPLANT

## 2020-05-21 NOTE — Op Note (Signed)
05/21/2020  4:03 PM  PATIENT:  Travis Mayo    PRE-OPERATIVE DIAGNOSIS:  Infected Left Ankle Fusion  POST-OPERATIVE DIAGNOSIS:  Same  PROCEDURE:  REPEAT IRRIGATION AND DEBRIDEMENT LEFT ANKLE Wound bed preparation for area 200 cm. Split thickness skin graft allograft 200 cm. Local tissue rearrangement for partial wound closure 200 cm. Application of cleanse choice wound VAC. Application 1 g ACell powder.  SURGEON:  Nadara Mustard, MD  PHYSICIAN ASSISTANT:None ANESTHESIA:   General  PREOPERATIVE INDICATIONS:  Travis Mayo is a  59 y.o. male with a diagnosis of Infected Left Ankle Fusion who failed conservative measures and elected for surgical management.    The risks benefits and alternatives were discussed with the patient preoperatively including but not limited to the risks of infection, bleeding, nerve injury, cardiopulmonary complications, the need for revision surgery, among others, and the patient was willing to proceed.  OPERATIVE IMPLANTS: 1 g ACell powder and 125 cm allograft split-thickness skin graft with cleanse choice wound VAC sponges x2  @ENCIMAGES @  OPERATIVE FINDINGS: Good granulating bed no signs of persistent infection.  OPERATIVE PROCEDURE: Patient brought the operating room and underwent a general anesthetic.  After adequate levels anesthesia were obtained patient's left lower extremity was prepped using Betadine paint draped into a sterile field a timeout was called.  The wound bed was debrided of fibrinous tissue with a rondure and 10 blade knife.  The wound edges were freshened back to healthy viable tissue.  The wound was irrigated with normal saline.  After the wound bed was prepared with healthy viable bleeding granulation tissue ACell powder was used to fill the deployed at this was covered with 125 cm of skin graft.  Local tissue rearrangement was used to close the wound over the skin which advanced the wound edges 200 cm and left the wound opening  approximately 150 cm.  This had good petechial bleeding.  The cleanse choice sponges were applied this was covered with a derma tack overwrapped with Coban and this had a good suction fit.  Patient was extubated taken the PACU in stable condition.   DISCHARGE PLANNING:  Antibiotic duration: Continue IV antibiotics for 6 weeks  Weightbearing: Nonweightbearing with a cam boot walker on the left  Pain medication: Opioid pathway  Dressing care/ Wound VAC: Continue wound VAC for 1 week  Ambulatory devices: Walker or crutches  Discharge to: On Saturday.  Follow-up: In the office 1 week post operative.

## 2020-05-21 NOTE — Plan of Care (Signed)

## 2020-05-21 NOTE — Progress Notes (Signed)
Orthopedic Tech Progress Note Patient Details:  Travis Mayo [birth year] [id number] Patient already has cam walker Patient ID: Travis Mayo, male   DOB: 12-20-1960, 59 y.o.   MRN: [id number]   Michelle Piper 05/21/2020, 5:33 PM

## 2020-05-21 NOTE — Transfer of Care (Signed)
Immediate Anesthesia Transfer of Care Note  Patient: Travis Mayo  Procedure(s) Performed: REPEAT IRRIGATION AND DEBRIDEMENT LEFT ANKLE (Left )  Patient Location: PACU  Anesthesia Type:General  Level of Consciousness: awake, patient cooperative and responds to stimulation  Airway & Oxygen Therapy: Patient Spontanous Breathing and Patient connected to face mask oxygen  Post-op Assessment: Report given to RN and Post -op Vital signs reviewed and stable  Post vital signs: Reviewed and stable  Last Vitals:  Vitals Value Taken Time  BP 150/99 05/21/20 1557  Temp    Pulse 114 05/21/20 1600  Resp 25 05/21/20 1600  SpO2 92 % 05/21/20 1600  Vitals shown include unvalidated device data.  Last Pain:  Vitals:   05/21/20 1300  TempSrc:   PainSc: 0-No pain      Patients Stated Pain Goal: 2 (05/17/20 2300)  Complications: No complications documented.

## 2020-05-21 NOTE — Anesthesia Procedure Notes (Signed)
Procedure Name: LMA Insertion Date/Time: 05/21/2020 3:16 PM Performed by: Marny Lowenstein, CRNA Pre-anesthesia Checklist: Patient identified, Emergency Drugs available, Suction available, Patient being monitored and Timeout performed Patient Re-evaluated:Patient Re-evaluated prior to induction Oxygen Delivery Method: Circle system utilized Preoxygenation: Pre-oxygenation with 100% oxygen Induction Type: IV induction LMA: LMA inserted LMA Size: 5.0 Laser Tube: Cuffed inflated with minimal occlusive pressure - saline Placement Confirmation: positive ETCO2 and breath sounds checked- equal and bilateral Tube secured with: Tape Dental Injury: Teeth and Oropharynx as per pre-operative assessment  Comments: SRNA placed Aquilla Hacker

## 2020-05-21 NOTE — Progress Notes (Signed)
PT Cancellation Note  Patient Details Name: Travis Mayo MRN: [id number] DOB: Sep 15, 1961   Cancelled Treatment:    Reason Eval/Treat Not Completed: (P) Patient declined, no reason specified (Pt reports, " I don't want to climb stairs attached to all these tubes.  I will try tomorrow."  Plan for surgery later this pm with d/c this weekend.)   Tiona Ruane Artis Delay 05/21/2020, 11:05 AM  Bonney Leitz , PTA Acute Rehabilitation Services Pager [phone] Office [phone]

## 2020-05-21 NOTE — Anesthesia Preprocedure Evaluation (Signed)
Anesthesia Evaluation  Patient identified by MRN, date of birth, ID band Patient awake    Reviewed: Allergy & Precautions, NPO status , Patient's Chart, lab work & pertinent test results  Airway Mallampati: II  TM Distance: >3 FB Neck ROM: Full    Dental no notable dental hx. (+) Teeth Intact, Dental Advisory Given   Pulmonary Current Smoker and Patient abstained from smoking.,  Cigars socially    Pulmonary exam normal breath sounds clear to auscultation       Cardiovascular hypertension, Pt. on medications Normal cardiovascular exam Rhythm:Regular Rate:Normal  Uncontrolled HTN- stopped taking lisinopril    Neuro/Psych negative neurological ROS  negative psych ROS   GI/Hepatic negative GI ROS, (+)     substance abuse  alcohol use,   Endo/Other  negative endocrine ROS  Renal/GU negative Renal ROS  negative genitourinary   Musculoskeletal  (+) Arthritis , Osteoarthritis,  Infected left ankle fusion   Abdominal Normal abdominal exam  (+)   Peds  Hematology negative hematology ROS (+)   Anesthesia Other Findings   Reproductive/Obstetrics negative OB ROS                             Anesthesia Physical  Anesthesia Plan  ASA: II  Anesthesia Plan: General   Post-op Pain Management:    Induction: Intravenous  PONV Risk Score and Plan: 1 and Ondansetron and Treatment may vary due to age or medical condition  Airway Management Planned: LMA  Additional Equipment: None  Intra-op Plan:   Post-operative Plan: Extubation in OR  Informed Consent: I have reviewed the patients History and Physical, chart, labs and discussed the procedure including the risks, benefits and alternatives for the proposed anesthesia with the patient or authorized representative who has indicated his/her understanding and acceptance.     Dental advisory given  Plan Discussed with: CRNA  Anesthesia Plan  Comments:         Anesthesia Quick Evaluation

## 2020-05-22 MED ORDER — HEPARIN SOD (PORK) LOCK FLUSH 100 UNIT/ML IV SOLN
250.0000 [IU] | INTRAVENOUS | Status: DC | PRN
  Filled 2020-05-22: qty 2.5

## 2020-05-22 NOTE — Plan of Care (Signed)

## 2020-05-22 NOTE — Discharge Summary (Signed)
Discharge Diagnoses:  Active Problems:   Hardware complicating wound infection ([place])   Abscess of bursa of left ankle   MSSA (methicillin susceptible Staphylococcus aureus) infection   Cardiac murmur due to mitral valve disorder   Surgeries: Procedure(s): REPEAT IRRIGATION AND DEBRIDEMENT LEFT ANKLE on 05/21/2020    Consultants:   Discharged Condition: Improved  Hospital Course: Travis Mayo is an 59 y.o. male who was admitted 05/12/2020 with a chief complaint of infected hardware left ankle fusion, with a final diagnosis of Infected Left Ankle Fusion.  Patient was brought to the operating room on 05/21/2020 and underwent Procedure(s): REPEAT IRRIGATION AND DEBRIDEMENT LEFT ANKLE.    Patient was given perioperative antibiotics:  Anti-infectives (From admission, onward)   Start     Dose/Rate Route Frequency Ordered Stop   05/21/20 1400  ceFAZolin (ANCEF) IVPB 2g/100 mL premix        2 g 200 mL/hr over 30 Minutes Intravenous On call to O.R. 05/21/20 1331 05/21/20 1517   05/21/20 0000  ceFAZolin (ANCEF) IVPB        2 g Intravenous Every 8 hours 05/21/20 1302 06/27/20 2359   05/14/20 2200  ceFAZolin (ANCEF) IVPB 2g/100 mL premix        2 g 200 mL/hr over 30 Minutes Intravenous Every 8 hours 05/14/20 1746     05/13/20 1800  cefTRIAXone (ROCEPHIN) 2 g in sodium chloride 0.9 % 100 mL IVPB  Status:  Discontinued        2 g 200 mL/hr over 30 Minutes Intravenous Every 24 hours 05/13/20 1154 05/14/20 1745   05/13/20 0600  ceFAZolin (ANCEF) IVPB 2g/100 mL premix        2 g 200 mL/hr over 30 Minutes Intravenous On call to O.R. 05/12/20 1448 05/12/20 1948   05/13/20 0030  vancomycin (VANCOREADY) IVPB 1250 mg/250 mL  Status:  Discontinued        1,250 mg 166.7 mL/hr over 90 Minutes Intravenous Every 12 hours 05/13/20 0013 05/15/20 1310   05/13/20 0015  piperacillin-tazobactam (ZOSYN) IVPB 3.375 g  Status:  Discontinued        3.375 g 12.5 mL/hr over 240 Minutes Intravenous Every 8 hours  05/13/20 0013 05/13/20 1154    .  Patient was given sequential compression devices, early ambulation, and aspirin for DVT prophylaxis.  Recent vital signs:  Patient Vitals for the past 24 hrs:  BP Temp Temp src Pulse Resp SpO2  05/22/20 0747 139/89 97.7 F (36.5 C) Oral 79 17 98 %  05/22/20 0349 (!) 146/88 98.8 F (37.1 C) Oral 91 19 98 %  05/21/20 1949 (!) 141/93 98.4 F (36.9 C) Oral 97 18 95 %  05/21/20 1626 (!) 143/88 97.6 F (36.4 C) Oral (!) 107 16 96 %  05/21/20 1612 (!) 149/93 (!) 97.3 F (36.3 C) -- (!) 108 18 97 %  05/21/20 1557 (!) 150/99 97.8 F (36.6 C) -- (!) 111 16 93 %  .  Recent laboratory studies: No results found.  Discharge Medications:   Allergies as of 05/22/2020   No Known Allergies     Medication List    STOP taking these medications   doxycycline 100 MG tablet Commonly known as: VIBRA-TABS   oxyCODONE-acetaminophen 5-325 MG tablet Commonly known as: Percocet     TAKE these medications   ceFAZolin  IVPB Commonly known as: ANCEF Inject 2 g into the vein every 8 (eight) hours. Indication:  Wound Infection First Dose: yes Last Day of Therapy:  06/23/2020 Labs -  Once weekly:  CBC/D and BMP, Labs - Every other week:  ESR and CRP Method of administration: IV Push Method of administration may be changed at the discretion of home infusion pharmacist based upon assessment of the patient and/or caregiver's ability to self-administer the medication ordered.   ibuprofen 200 MG tablet Commonly known as: ADVIL Take 800 mg by mouth every 6 (six) hours as needed (for inflammation).   oxyCODONE 5 MG immediate release tablet Commonly known as: Oxy IR/ROXICODONE Take 1-2 tablets (5-10 mg total) by mouth every 4 (four) hours as needed for moderate pain (pain score 4-6).   vitamin C 1000 MG tablet Take 1,000 mg by mouth daily.   VITAMIN D3 PO Take 1 tablet by mouth daily.            Discharge Care Instructions  (From admission, onward)          Start     Ordered   05/21/20 0000  Change dressing on IV access line weekly and PRN  (Home infusion instructions - [facility] )        05/21/20 1302          Diagnostic Studies: XR Ankle Complete Left  Result Date: 05/11/2020 Three-view radiographs of the left ankle shows stable tibial calcaneal fusion no hardware lucency.  ECHOCARDIOGRAM LIMITED  Result Date: 05/17/2020    ECHOCARDIOGRAM LIMITED REPORT   Patient Name:   Travis Mayo Date of Exam: 05/17/2020 Medical Rec #:  [id number]      Height:       72.0 in Accession #:    [id number]     Weight:       210.0 lb Date of Birth:  1960-12-20      BSA:          2.175 m Patient Age:    66 years       BP:           122/69 mmHg Patient Gender: M              HR:           75 bpm. Exam Location:  Inpatient Procedure: 2D Echo, Cardiac Doppler and Color Doppler Indications:    Endocarditis  History:        Patient has prior history of Echocardiogram examinations, most                 recent 06/10/2014. Risk Factors:Current Smoker.  Sonographer:    Clayton Lefort RDCS (AE) Referring Phys: [id number] [facility] [place]  Sonographer Comments: Suboptimal parasternal window, suboptimal apical window and Technically difficult study due to poor echo windows. IMPRESSIONS  1. Poor acoustic windows Given calcification of AV/MV if suspicion for SBE is high consider f/u TEE.  2. Left ventricular ejection fraction, by estimation, is 60 to 65%. The left ventricle has normal function. The left ventricle has no regional wall motion abnormalities. There is moderate left ventricular hypertrophy.  3. Right ventricular systolic function is normal. The right ventricular size is normal.  4. Left atrial size was mildly dilated.  5. Moderate MAC and leaflet thickening/calcification. The mitral valve is abnormal. No evidence of mitral valve regurgitation. No evidence of mitral stenosis.  6. The aortic valve is abnormal. Aortic valve regurgitation is not visualized. Mild aortic  valve stenosis.  7. Aortic dilatation noted. There is mild dilatation of the aortic root measuring 40 mm.  8. The inferior vena cava is normal in size with greater than 50% respiratory variability,  suggesting right atrial pressure of 3 mmHg. FINDINGS  Left Ventricle: Left ventricular ejection fraction, by estimation, is 60 to 65%. The left ventricle has normal function. The left ventricle has no regional wall motion abnormalities. The left ventricular internal cavity size was normal in size. There is  moderate left ventricular hypertrophy. Right Ventricle: The right ventricular size is normal. No increase in right ventricular wall thickness. Right ventricular systolic function is normal. Left Atrium: Left atrial size was mildly dilated. Right Atrium: Right atrial size was normal in size. Pericardium: There is no evidence of pericardial effusion. Mitral Valve: Moderate MAC and leaflet thickening/calcification. The mitral valve is abnormal. There is moderate thickening of the mitral valve leaflet(s). There is moderate calcification of the mitral valve leaflet(s). Normal mobility of the mitral valve leaflets. Moderate mitral annular calcification. No evidence of mitral valve stenosis. Tricuspid Valve: The tricuspid valve is normal in structure. Tricuspid valve regurgitation is trivial. No evidence of tricuspid stenosis. Aortic Valve: The aortic valve is abnormal. . There is moderate thickening and moderate calcification of the aortic valve. Aortic valve regurgitation is not visualized. Mild aortic stenosis is present. There is moderate thickening of the aortic valve. There is moderate calcification of the aortic valve. Aortic valve mean gradient measures 12.0 mmHg. Aortic valve peak gradient measures 22.1 mmHg. Aortic valve area, by VTI measures 1.54 cm. Pulmonic Valve: The pulmonic valve was normal in structure. Pulmonic valve regurgitation is not visualized. No evidence of pulmonic stenosis. Aorta: The aortic root  is normal in size and structure and aortic dilatation noted. There is mild dilatation of the aortic root measuring 40 mm. Venous: The inferior vena cava is normal in size with greater than 50% respiratory variability, suggesting right atrial pressure of 3 mmHg. IAS/Shunts: The interatrial septum was not well visualized. Additional Comments: Poor acoustic windows Given calcification of AV/MV if suspicion for SBE is high consider f/u TEE. LEFT VENTRICLE PLAX 2D LVIDd:         4.50 cm LVIDs:         2.80 cm LV PW:         1.90 cm LV IVS:        1.80 cm LVOT diam:     1.80 cm LV SV:         59 LV SV Index:   27 LVOT Area:     2.54 cm  IVC IVC diam: 1.80 cm LEFT ATRIUM         Index LA diam:    4.00 cm 1.84 cm/m  AORTIC VALVE AV Area (Vmax):    1.36 cm AV Area (Vmean):   1.17 cm AV Area (VTI):     1.54 cm AV Vmax:           235.00 cm/s AV Vmean:          161.000 cm/s AV VTI:            0.385 m AV Peak Grad:      22.1 mmHg AV Mean Grad:      12.0 mmHg LVOT Vmax:         126.00 cm/s LVOT Vmean:        73.800 cm/s LVOT VTI:          0.233 m LVOT/AV VTI ratio: 0.60  AORTA Ao Root diam: 4.00 cm Ao Asc diam:  3.70 cm  SHUNTS Systemic VTI:  0.23 m Systemic Diam: 1.80 cm Jenkins Rouge MD Electronically signed by Jenkins Rouge MD Signature Date/Time: 05/17/2020/2:56:36  PM    Final    [place] EKG SITE RITE  Result Date: 05/17/2020 If Site Rite image not attached, placement could not be confirmed due to current cardiac rhythm.  [place] EKG SITE RITE  Result Date: 05/14/2020 If Site Rite image not attached, placement could not be confirmed due to current cardiac rhythm.   Patient benefited maximally from their hospital stay and there were no complications.     Disposition: Discharge disposition: 01-Home or Self Care      Discharge Instructions    [facility] pharmacist to adjust dose for Vancomycin, Aminoglycosides and other anti-infective therapies as requested by physician.   Complete by: As directed     [facility] to provide Cath Flo 22m   Complete by: As directed    Administer for PICC line occlusion and as ordered by physician for other access device issues.   Anaphylaxis Kit: Provided to treat any anaphylactic reaction to the medication being provided to the patient if First Dose or when requested by physician   Complete by: As directed    Epinephrine 174mml vial / amp: Administer 0.24m124m0.24ml124mubcutaneously once for moderate to severe anaphylaxis, nurse to call physician and pharmacy when reaction occurs and call 911 if needed for immediate care   Diphenhydramine 50mg624mIV vial: Administer 25-50mg 10mM PRN for first dose reaction, rash, itching, mild reaction, nurse to call physician and pharmacy when reaction occurs   Sodium Chloride 0.9% NS 500ml I55mdminister if needed for hypovolemic blood pressure drop or as ordered by physician after call to physician with anaphylactic reaction   Call MD / Call 911   Complete by: As directed    If you experience chest pain or shortness of breath, CALL 911 and be transported to the hospital emergency room.  If you develope a fever above 101 F, pus (white drainage) or increased drainage or redness at the wound, or calf pain, call your surgeon's office.   Call MD / Call 911   Complete by: As directed    If you experience chest pain or shortness of breath, CALL 911 and be transported to the hospital emergency room.  If you develope a fever above 101 F, pus (white drainage) or increased drainage or redness at the wound, or calf pain, call your surgeon's office.   Change dressing on IV access line weekly and PRN   Complete by: As directed    Constipation Prevention   Complete by: As directed    Drink plenty of fluids.  Prune juice may be helpful.  You may use a stool softener, such as Colace (over the counter) 100 mg twice a day.  Use MiraLax (over the counter) for constipation as needed.   Constipation Prevention   Complete by: As directed     Drink plenty of fluids.  Prune juice may be helpful.  You may use a stool softener, such as Colace (over the counter) 100 mg twice a day.  Use MiraLax (over the counter) for constipation as needed.   Diet - low sodium heart healthy   Complete by: As directed    Diet - low sodium heart healthy   Complete by: As directed    Discharge instructions   Complete by: As directed    Keep dressing dry. Nonweightbearing left. Elevate Left leg   Flush IV access with Sodium Chloride 0.9% and Heparin 10 units/ml or 100 units/ml   Complete by: As directed    Home infusion instructions - [facility]   Complete by: As directed    Instructions: Flush IV access with Sodium Chloride 0.9% and Heparin 10units/ml or 100units/ml   Change dressing on IV access line: Weekly and PRN   Instructions Cath Flo 53m: Administer for PICC Line occlusion and as ordered by physician for other access device   [facility] pharmacist to adjust dose for: Vancomycin, Aminoglycosides and other anti-infective therapies as requested by physician   Increase activity slowly as tolerated   Complete by: As directed    Increase activity slowly as tolerated   Complete by: As directed    Method of administration may be changed at the discretion of home infusion pharmacist based upon assessment of the patient and/or caregiver's ability to self-administer the medication ordered   Complete by: As directed    Negative Pressure Wound Therapy - Incisional   Complete by: As directed    Show Patient how to attach prevena vac      Follow-up Information    Persons, MBevely Palmer PA In 1 week.   Specialty: Orthopedic Surgery Contact information: [phone]       [facility] Follow up.   Why: [facility] ([facility]) will be coordinating your IV antibiotics for home.       [facility] Follow up on 06/15/2020.   Specialty: Infectious  Diseases Why: 2:00 pm appointment with Dr. SBaxter Flattery  Contact information: [phone]      [facility] Follow up.   Why: [facility] providing you with Home Health RN support for your PICC line dressing changes, IV antibiotic support and labs. Contact information: phone number:  [phone]       YJEHUDJ MBevely Palmer PUtahIn 1 week.   Specialty: Orthopedic Surgery Contact information: [id number]               Signed: MNewt Minion8/21/2021, 10:02 AM

## 2020-05-22 NOTE — Progress Notes (Signed)
OT Cancellation Note  Patient Details Name: Travis Mayo MRN: [id number] DOB: 04-08-1961   Cancelled Treatment:    Reason Eval/Treat Not Completed: Other (comment) (per pt no needs for Occupational Therapy services )  Alphia Moh 05/22/2020, 12:32 PM

## 2020-05-22 NOTE — Anesthesia Postprocedure Evaluation (Signed)
Anesthesia Post Note  Patient: Travis Mayo  Procedure(s) Performed: REPEAT IRRIGATION AND DEBRIDEMENT LEFT ANKLE (Left )     Patient location during evaluation: PACU Anesthesia Type: General Level of consciousness: awake and alert Pain management: pain level controlled Vital Signs Assessment: post-procedure vital signs reviewed and stable Respiratory status: spontaneous breathing, nonlabored ventilation and respiratory function stable Cardiovascular status: blood pressure returned to baseline and stable Postop Assessment: no apparent nausea or vomiting Anesthetic complications: no   No complications documented.  Last Vitals:  Vitals:   05/21/20 1949 05/22/20 0349  BP: (!) 141/93 (!) 146/88  Pulse: 97 91  Resp: 18 19  Temp: 36.9 C 37.1 C  SpO2: 95% 98%    Last Pain:  Vitals:   05/22/20 0349  TempSrc: Oral  PainSc:                  Lowella Curb

## 2020-05-22 NOTE — Progress Notes (Signed)
Patient ID: Travis Mayo, male   DOB: 01/06/1961, 59 y.o.   MRN: [id number] Patient is postoperative day 1 debridement with skin graft placement of a cleanse choice wound VAC.  The wound VAC had to axis.  The dressing was reinforced overwrapped with an Ace wrap and the wound VAC had 2 checks.  The wound VAC was attached to the Praveena portable pump and this had a good suction fit.  Plan for discharge today follow-up in the office next Thursday.

## 2020-05-22 NOTE — Progress Notes (Signed)
Discharge instructions provided.  Patient verbalizes understanding of all instructions and follow up care.  Pti discharged to home with belongings via wheelchair by NT at 1314 on 05/22/20.

## 2020-05-22 NOTE — Progress Notes (Signed)
PT Cancellation Note  Patient Details Name: Travis Mayo MRN: [id number] DOB: Jul 15, 1961   Cancelled Treatment:    Reason Eval/Treat Not Completed: Patient declined, no reason specified. Patient refuses to do steps at this time. Reports he has been getting up to bathroom with walker. Has been non-weight bearing for a while and is able to get up his steps. Feels like he has no needs at this time.     Sharnae Winfree 05/22/2020, 9:16 AM

## 2020-05-22 NOTE — Plan of Care (Signed)

## 2020-05-22 NOTE — Care Management (Signed)
Patient to dc home today with IV abx.  Jeri Modena, RN with [facility], states that drug will be delivered to patient home this afternoon.  He should stay to receive 12pm dose and then will give his own doses starting tonight.  [facility] RN will visit later this week.    Patient has a Praveena wound vac and will have removed in office on Thursday.  Patient will need to assume wound care in conjunction with Dr. Audrie Lia office after that.  [facility] does not provide wound care and patient is uninsured.

## 2020-05-24 ENCOUNTER — Ambulatory Visit: Payer: Self-pay | Admitting: Orthopedic Surgery

## 2020-05-24 ENCOUNTER — Encounter (HOSPITAL_COMMUNITY): Payer: Self-pay | Admitting: Orthopedic Surgery

## 2020-05-25 ENCOUNTER — Encounter: Payer: Self-pay | Admitting: Orthopedic Surgery

## 2020-05-25 ENCOUNTER — Ambulatory Visit (INDEPENDENT_AMBULATORY_CARE_PROVIDER_SITE_OTHER): Payer: Self-pay | Admitting: Physician Assistant

## 2020-05-25 ENCOUNTER — Encounter: Payer: Self-pay | Admitting: Physician Assistant

## 2020-05-25 VITALS — Ht 72.0 in | Wt 210.0 lb

## 2020-05-25 DIAGNOSIS — M14672 Charcot's joint, left ankle and foot: Secondary | ICD-10-CM

## 2020-05-25 MED ORDER — SILVER SULFADIAZINE 1 % EX CREA: 1.0000 "application " | TOPICAL_CREAM | Freq: Every day | CUTANEOUS | 0 refills | Status: DC

## 2020-05-25 NOTE — Progress Notes (Signed)
Office Visit Note   Patient: Travis Mayo           Date of Birth: 05/14/1961           MRN: [id number] Visit Date: 05/25/2020              Requested by: Felix Pacini, FNP [address] [place],  [place] [address] PCP: Felix Pacini, FNP  Chief Complaint  Patient presents with  . Left Ankle - Routine Post Op    05/21/20 left ankle I&D STSG      HPI: Patient is 5 days status post irrigation and debridement of ankle wound with application of wound VAC.  Skin grafting.  Unfortunately his wound VAC stopped working  Assessment & Plan: Visit Diagnoses: No diagnosis found.  Plan: We will begin daily dressing changes with Silvadene follow-up in 1 week  Follow-Up Instructions: No follow-ups on file.   Ortho Exam  Patient is alert, oriented, no adenopathy, well-dressed, normal affect, normal respiratory effort. He has significant soft tissue swelling but no cellulitis.  Healthy wound edges and healthy granulation tissue in the wound.  No ascending cellulitis no foul odor  Imaging: No results found. No images are attached to the encounter.  Labs: Lab Results  Component Value Date   ESRSEDRATE 106 (H) 05/20/2020   ESRSEDRATE 29 (H) 03/10/2020   CRP 6.8 (H) 05/20/2020   CRP 1.2 (H) 03/10/2020   REPTSTATUS 05/18/2020 FINAL 05/12/2020   GRAMSTAIN NO WBC SEEN RARE GRAM POSITIVE COCCI IN PAIRS  05/12/2020   CULT  05/12/2020    MODERATE STAPHYLOCOCCUS AUREUS NO ANAEROBES ISOLATED Performed at [facility], [address] [place], [place] [address]    [facility] STAPHYLOCOCCUS AUREUS 05/12/2020     Lab Results  Component Value Date   ALBUMIN 3.1 (L) 05/12/2020   ALBUMIN 3.8 03/10/2020   ALBUMIN 4.1 10/07/2018    No results found for: MG No results found for: VD25OH  No results found for: PREALBUMIN CBC EXTENDED Latest Ref Rng & Units 05/18/2020 05/12/2020 03/26/2020  WBC 4.0 - 10.5 K/uL 7.4 15.6(H) 10.4  RBC 4.22 - 5.81 MIL/uL 3.47(L) 4.41 4.20(L)    HGB 13.0 - 17.0 g/dL 5.1(V) 12.7(L) 12.7(L)  HCT 39 - 52 % 30.0(L) 40.3 39.1  PLT 150 - 400 K/uL 584(H) 469(H) 458(H)  NEUTROABS 1.7 - 7.7 K/uL 5.5 - -  LYMPHSABS 0.7 - 4.0 K/uL 0.9 - -     Body mass index is 28.48 kg/m.  Orders:  No orders of the defined types were placed in this encounter.  Meds ordered this encounter  Medications  . silver sulfADIAZINE (SILVADENE) 1 % cream    Sig: Apply 1 application topically daily.    Dispense:  50 g    Refill:  0     Procedures: No procedures performed  Clinical Data: No additional findings.  ROS:  All other systems negative, except as noted in the HPI. Review of Systems  Objective: Vital Signs: Ht 6' (1.829 m)   Wt 210 lb (95.3 kg)   BMI 28.48 kg/m   Specialty Comments:  No specialty comments available.  PMFS History: Patient Active Problem List   Diagnosis Date Noted  . MSSA (methicillin susceptible Staphylococcus aureus) infection 05/18/2020  . Cardiac murmur due to mitral valve disorder 05/18/2020  . Abscess of bursa of left ankle 05/12/2020  . Hardware complicating wound infection ([facility])   . Left ankle pain 03/26/2020  . Charcot's joint, left ankle and foot  Past Medical History:  Diagnosis Date  . Arthritis   . Cataract   . Charcot's arthropathy    left  . Hypertension     No family history on file.  Past Surgical History:  Procedure Laterality Date  . ANKLE FUSION Left 03/26/2020   Procedure: LEFT TIBIOCALCANEAL FUSION;  Surgeon: Nadara Mustard, MD;  Location: [facility] OR;  Service: Orthopedics;  Laterality: Left;  . CATARACT EXTRACTION Left   . HERNIA REPAIR Right   . I & D EXTREMITY Left 05/12/2020   Procedure: DEBRIDEMENT LEFT ANKLE & REMOVAL DEEP HARDWARE;  Surgeon: Nadara Mustard, MD;  Location: [facility];  Service: Orthopedics;  Laterality: Left;  . I & D EXTREMITY Left 05/21/2020   Procedure: REPEAT IRRIGATION AND DEBRIDEMENT LEFT ANKLE;  Surgeon: Nadara Mustard, MD;  Location: [facility] OR;  Service:  Orthopedics;  Laterality: Left;  . KNEE SURGERY Right    cartilage   Social History   Occupational History  . Not on file  Tobacco Use  . Smoking status: Current Some Day Smoker    Types: Cigars  . Smokeless tobacco: Never Used  . Tobacco comment: random  Vaping Use  . Vaping Use: Never used  Substance and Sexual Activity  . Alcohol use: Yes    Alcohol/week: 9.0 standard drinks    Types: 4 Glasses of wine, 5 Cans of beer per week    Comment: 05/11/20- none at times  . Drug use: No  . Sexual activity: Not on file

## 2020-05-27 ENCOUNTER — Ambulatory Visit: Payer: Self-pay | Admitting: Orthopedic Surgery

## 2020-06-01 ENCOUNTER — Ambulatory Visit (INDEPENDENT_AMBULATORY_CARE_PROVIDER_SITE_OTHER): Payer: Self-pay | Admitting: Orthopedic Surgery

## 2020-06-01 ENCOUNTER — Encounter: Payer: Self-pay | Admitting: Orthopedic Surgery

## 2020-06-01 DIAGNOSIS — T847XXA Infection and inflammatory reaction due to other internal orthopedic prosthetic devices, implants and grafts, initial encounter: Secondary | ICD-10-CM

## 2020-06-01 DIAGNOSIS — L02419 Cutaneous abscess of limb, unspecified: Secondary | ICD-10-CM

## 2020-06-01 DIAGNOSIS — M14672 Charcot's joint, left ankle and foot: Secondary | ICD-10-CM

## 2020-06-01 DIAGNOSIS — Z945 Skin transplant status: Secondary | ICD-10-CM

## 2020-06-01 NOTE — Progress Notes (Signed)
Office Visit Note   Patient: Travis Mayo           Date of Birth: 06/13/1961           MRN: [id number] Visit Date: 06/01/2020              Requested by: Felix Pacini, FNP [address] [place],  [place] [address] PCP: Felix Pacini, FNP  Chief Complaint  Patient presents with  . Left Ankle - Routine Post Op      HPI: Patient is a 59 year old gentleman who presents in follow-up status post irrigation and debridement chronic osteomyelitis of the calcaneus removal of the fusion hardware and wound VAC therapy as well as split-thickness skin graft. Patient is currently on IV Ancef for 6 weeks for the chronic osteomyelitis. He has been ambulating with crutches in a cam boot nonweightbearing states he does have increased swelling.  Assessment & Plan: Visit Diagnoses:  1. Charcot's joint, left ankle and foot   2. Abscess of ankle   3. Hardware complicating wound infection, initial encounter (HCC)   4. H/O skin graft     Plan: Will apply Prisma dressing to the wound with a 3 layer Dynaflex compression wrap.  We will change this compression wrap and Prisma on Friday and then reevaluate next Tuesday.  Follow-Up Instructions: Return for Plan to follow-up Friday and Tuesday.   Ortho Exam  Patient is alert, oriented, no adenopathy, well-dressed, normal affect, normal respiratory effort. Examination patient does have some maceration from the Silvadene dressing changes. There is good petechial ingrowth into the the split-thickness skin graft. There is no cellulitis no odor no drainage no signs of infection. Patient does have significant swelling which will limit wound healing.  Imaging: No results found. No images are attached to the encounter.  Labs: Lab Results  Component Value Date   ESRSEDRATE 106 (H) 05/20/2020   ESRSEDRATE 29 (H) 03/10/2020   CRP 6.8 (H) 05/20/2020   CRP 1.2 (H) 03/10/2020   REPTSTATUS 05/18/2020 FINAL 05/12/2020   GRAMSTAIN NO WBC  SEEN RARE GRAM POSITIVE COCCI IN PAIRS  05/12/2020   CULT  05/12/2020    MODERATE STAPHYLOCOCCUS AUREUS NO ANAEROBES ISOLATED Performed at [facility], [address], [place], [place] [address]    [facility] STAPHYLOCOCCUS AUREUS 05/12/2020     Lab Results  Component Value Date   ALBUMIN 3.1 (L) 05/12/2020   ALBUMIN 3.8 03/10/2020   ALBUMIN 4.1 10/07/2018    No results found for: MG No results found for: VD25OH  No results found for: PREALBUMIN CBC EXTENDED Latest Ref Rng & Units 05/18/2020 05/12/2020 03/26/2020  WBC 4.0 - 10.5 K/uL 7.4 15.6(H) 10.4  RBC 4.22 - 5.81 MIL/uL 3.47(L) 4.41 4.20(L)  HGB 13.0 - 17.0 g/dL 3.5(K) 12.7(L) 12.7(L)  HCT 39 - 52 % 30.0(L) 40.3 39.1  PLT 150 - 400 K/uL 584(H) 469(H) 458(H)  NEUTROABS 1.7 - 7.7 K/uL 5.5 - -  LYMPHSABS 0.7 - 4.0 K/uL 0.9 - -     There is no height or weight on file to calculate BMI.  Orders:  No orders of the defined types were placed in this encounter.  No orders of the defined types were placed in this encounter.    Procedures: No procedures performed  Clinical Data: No additional findings.  ROS:  All other systems negative, except as noted in the HPI. Review of Systems  Objective: Vital Signs: There were no vitals taken for this visit.  Specialty Comments:  No specialty comments available.  PMFS History: Patient Active Problem List   Diagnosis Date Noted  . MSSA (methicillin susceptible Staphylococcus aureus) infection 05/18/2020  . Cardiac murmur due to mitral valve disorder 05/18/2020  . Abscess of bursa of left ankle 05/12/2020  . Hardware complicating wound infection (HCC)   . Left ankle pain 03/26/2020  . Charcot's joint, left ankle and foot    Past Medical History:  Diagnosis Date  . Arthritis   . Cataract   . Charcot's arthropathy    left  . Hypertension     History reviewed. No pertinent family history.  Past Surgical History:  Procedure Laterality Date  . ANKLE FUSION  Left 03/26/2020   Procedure: LEFT TIBIOCALCANEAL FUSION;  Surgeon: Nadara Mustard, MD;  Location: [facility] OR;  Service: Orthopedics;  Laterality: Left;  . CATARACT EXTRACTION Left   . HERNIA REPAIR Right   . I & D EXTREMITY Left 05/12/2020   Procedure: DEBRIDEMENT LEFT ANKLE & REMOVAL DEEP HARDWARE;  Surgeon: Nadara Mustard, MD;  Location: [facility] OR;  Service: Orthopedics;  Laterality: Left;  . I & D EXTREMITY Left 05/21/2020   Procedure: REPEAT IRRIGATION AND DEBRIDEMENT LEFT ANKLE;  Surgeon: Nadara Mustard, MD;  Location: [facility] OR;  Service: Orthopedics;  Laterality: Left;  . KNEE SURGERY Right    cartilage   Social History   Occupational History  . Not on file  Tobacco Use  . Smoking status: Current Some Day Smoker    Types: Cigars  . Smokeless tobacco: Never Used  . Tobacco comment: random  Vaping Use  . Vaping Use: Never used  Substance and Sexual Activity  . Alcohol use: Yes    Alcohol/week: 9.0 standard drinks    Types: 4 Glasses of wine, 5 Cans of beer per week    Comment: 05/11/20- none at times  . Drug use: No  . Sexual activity: Not on file

## 2020-06-02 ENCOUNTER — Other Ambulatory Visit: Payer: Self-pay | Admitting: Orthopedic Surgery

## 2020-06-02 NOTE — Telephone Encounter (Signed)
Did you want to refill this medication for pt?

## 2020-06-03 ENCOUNTER — Telehealth: Payer: Self-pay | Admitting: Orthopedic Surgery

## 2020-06-03 NOTE — Telephone Encounter (Signed)
ic patient, advised STD forms ready to be picked up.

## 2020-06-04 ENCOUNTER — Encounter: Payer: Self-pay | Admitting: Physician Assistant

## 2020-06-04 ENCOUNTER — Ambulatory Visit (INDEPENDENT_AMBULATORY_CARE_PROVIDER_SITE_OTHER): Payer: Self-pay | Admitting: Physician Assistant

## 2020-06-04 DIAGNOSIS — Z945 Skin transplant status: Secondary | ICD-10-CM

## 2020-06-04 NOTE — Progress Notes (Signed)
Office Visit Note   Patient: Travis Mayo           Date of Birth: 1960/12/30           MRN: [id number] Visit Date: 06/04/2020              Requested by: Felix Pacini, FNP [address] [place],  [place] [address] PCP: Felix Pacini, FNP  No chief complaint on file.     HPI: Patient presents today in follow-up for his left ankle. He is approximately 2 weeks status post repeat irrigation and debridement removal of hardware from his left ankle. He has been receiving IV antibiotics. He is here to have his Profore dressing changed.  Assessment & Plan: Visit Diagnoses: No diagnosis found.  Plan: New dressing was applied today. Prisma was used over the wound. He will follow-up on Tuesday for reevaluation  Follow-Up Instructions: No follow-ups on file.   Ortho Exam  Patient is alert, oriented, no adenopathy, well-dressed, normal affect, normal respiratory effort. Left ankle: Still significant swelling in his foot and ankle. No fluctuance foul odor. Skin graft is in place no cellulitis noted or foul odor or evidence of infection directly around the wound graft. Compartments of the leg are soft and nontender  Imaging: No results found. No images are attached to the encounter.  Labs: Lab Results  Component Value Date   ESRSEDRATE 106 (H) 05/20/2020   ESRSEDRATE 29 (H) 03/10/2020   CRP 6.8 (H) 05/20/2020   CRP 1.2 (H) 03/10/2020   REPTSTATUS 05/18/2020 FINAL 05/12/2020   GRAMSTAIN NO WBC SEEN RARE GRAM POSITIVE COCCI IN PAIRS  05/12/2020   CULT  05/12/2020    MODERATE STAPHYLOCOCCUS AUREUS NO ANAEROBES ISOLATED Performed at [facility], [address] [place], [place] [address]    [facility] STAPHYLOCOCCUS AUREUS 05/12/2020     Lab Results  Component Value Date   ALBUMIN 3.1 (L) 05/12/2020   ALBUMIN 3.8 03/10/2020   ALBUMIN 4.1 10/07/2018    No results found for: MG No results found for: VD25OH  No results found for: PREALBUMIN CBC EXTENDED  Latest Ref Rng & Units 05/18/2020 05/12/2020 03/26/2020  WBC 4.0 - 10.5 K/uL 7.4 15.6(H) 10.4  RBC 4.22 - 5.81 MIL/uL 3.47(L) 4.41 4.20(L)  HGB 13.0 - 17.0 g/dL 0.9(G) 12.7(L) 12.7(L)  HCT 39 - 52 % 30.0(L) 40.3 39.1  PLT 150 - 400 K/uL 584(H) 469(H) 458(H)  NEUTROABS 1.7 - 7.7 K/uL 5.5 - -  LYMPHSABS 0.7 - 4.0 K/uL 0.9 - -     There is no height or weight on file to calculate BMI.  Orders:  No orders of the defined types were placed in this encounter.  No orders of the defined types were placed in this encounter.    Procedures: No procedures performed  Clinical Data: No additional findings.  ROS:  All other systems negative, except as noted in the HPI. Review of Systems  Objective: Vital Signs: There were no vitals taken for this visit.  Specialty Comments:  No specialty comments available.  PMFS History: Patient Active Problem List   Diagnosis Date Noted  . MSSA (methicillin susceptible Staphylococcus aureus) infection 05/18/2020  . Cardiac murmur due to mitral valve disorder 05/18/2020  . Abscess of bursa of left ankle 05/12/2020  . Hardware complicating wound infection (HCC)   . Left ankle pain 03/26/2020  . Charcot's joint, left ankle and foot    Past Medical History:  Diagnosis Date  . Arthritis   .  Cataract   . Charcot's arthropathy    left  . Hypertension     No family history on file.  Past Surgical History:  Procedure Laterality Date  . ANKLE FUSION Left 03/26/2020   Procedure: LEFT TIBIOCALCANEAL FUSION;  Surgeon: Nadara Mustard, MD;  Location[facility] OR;  Service: Orthopedics;  Laterality: Left;  . CATARACT EXTRACTION Left   . HERNIA REPAIR Right   . I & D EXTREMITY Left 05/12/2020   Procedure: DEBRIDEMENT LEFT ANKLE & REMOVAL DEEP HARDWARE;  Surgeon: Nadara Mustard, MD;  Location: [facility] OR;  Service: Orthopedics;  Laterality: Left;  . I & D EXTREMITY Left 05/21/2020   Procedure: REPEAT IRRIGATION AND DEBRIDEMENT LEFT ANKLE;  Surgeon: Nadara Mustard, MD;   Location: [facility] OR;  Service: Orthopedics;  Laterality: Left;  . KNEE SURGERY Right    cartilage   Social History   Occupational History  . Not on file  Tobacco Use  . Smoking status: Current Some Day Smoker    Types: Cigars  . Smokeless tobacco: Never Used  . Tobacco comment: random  Vaping Use  . Vaping Use: Never used  Substance and Sexual Activity  . Alcohol use: Yes    Alcohol/week: 9.0 standard drinks    Types: 4 Glasses of wine, 5 Cans of beer per week    Comment: 05/11/20- none at times  . Drug use: No  . Sexual activity: Not on file

## 2020-06-08 ENCOUNTER — Encounter: Payer: Self-pay | Admitting: Orthopedic Surgery

## 2020-06-08 ENCOUNTER — Ambulatory Visit (INDEPENDENT_AMBULATORY_CARE_PROVIDER_SITE_OTHER): Payer: Self-pay | Admitting: Physician Assistant

## 2020-06-08 DIAGNOSIS — Z945 Skin transplant status: Secondary | ICD-10-CM

## 2020-06-08 NOTE — Progress Notes (Signed)
Office Visit Note   Patient: Travis Mayo           Date of Birth: [birth year]           MRN: [id number] Visit Date: 06/08/2020              Requested by: Felix Pacini, FNP [address] [place],  [place] [address] PCP: Felix Pacini, FNP  Chief Complaint  Patient presents with  . f/u left lower extremity-compression wrap      HPI: This is a pleasant gentleman who is status post removal of hardware skin grafting to his left ankle foot.  He has been undergoing Profore wraps.  This was last done on Friday here to follow-up today tolerating them well denies any pain  Assessment & Plan: Visit Diagnoses: No diagnosis found.  Plan: We will rewrap them today follow-up on Friday for changing the wrap.  He actually is making good progress.  Follow-Up Instructions: No follow-ups on file.   Ortho Exam  Patient is alert, oriented, no adenopathy, well-dressed, normal affect, normal respiratory effort. Left lower extremity.  Skin graft is intact he is showing some good vascular fibrous tissue.  Swelling has decreased in his calf and some in his ankle adjacent to the wound.  He still has swelling in the hindfoot in the toes.  But we are seeing more more wrinkling.  No foul odor no cellulitis no excessive drainage Imaging: No results found. No images are attached to the encounter.  Labs: Lab Results  Component Value Date   ESRSEDRATE 106 (H) 05/20/2020   ESRSEDRATE 29 (H) 03/10/2020   CRP 6.8 (H) 05/20/2020   CRP 1.2 (H) 03/10/2020   REPTSTATUS 05/18/2020 FINAL 05/12/2020   GRAMSTAIN NO WBC SEEN RARE GRAM POSITIVE COCCI IN PAIRS  05/12/2020   CULT  05/12/2020    MODERATE STAPHYLOCOCCUS AUREUS NO ANAEROBES ISOLATED Performed at [facility], [address], [place], [place] [address]    [facility] STAPHYLOCOCCUS AUREUS 05/12/2020     Lab Results  Component Value Date   ALBUMIN 3.1 (L) 05/12/2020   ALBUMIN 3.8 03/10/2020   ALBUMIN 4.1 10/07/2018    No  results found for: MG No results found for: VD25OH  No results found for: PREALBUMIN CBC EXTENDED Latest Ref Rng & Units 05/18/2020 05/12/2020 03/26/2020  WBC 4.0 - 10.5 K/uL 7.4 15.6(H) 10.4  RBC 4.22 - 5.81 MIL/uL 3.47(L) 4.41 4.20(L)  HGB 13.0 - 17.0 g/dL 0.2(R) 12.7(L) 12.7(L)  HCT 39 - 52 % 30.0(L) 40.3 39.1  PLT 150 - 400 K/uL 584(H) 469(H) 458(H)  NEUTROABS 1.7 - 7.7 K/uL 5.5 - -  LYMPHSABS 0.7 - 4.0 K/uL 0.9 - -     There is no height or weight on file to calculate BMI.  Orders:  No orders of the defined types were placed in this encounter.  No orders of the defined types were placed in this encounter.    Procedures: No procedures performed  Clinical Data: No additional findings.  ROS:  All other systems negative, except as noted in the HPI. Review of Systems  Objective: Vital Signs: There were no vitals taken for this visit.  Specialty Comments:  No specialty comments available.  PMFS History: Patient Active Problem List   Diagnosis Date Noted  . MSSA (methicillin susceptible Staphylococcus aureus) infection 05/18/2020  . Cardiac murmur due to mitral valve disorder 05/18/2020  . Abscess of bursa of left ankle 05/12/2020  . Hardware complicating wound infection (HCC)   .  Left ankle pain 03/26/2020  . Charcot's joint, left ankle and foot    Past Medical History:  Diagnosis Date  . Arthritis   . Cataract   . Charcot's arthropathy    left  . Hypertension     No family history on file.  Past Surgical History:  Procedure Laterality Date  . ANKLE FUSION Left 03/26/2020   Procedure: LEFT TIBIOCALCANEAL FUSION;  Surgeon: Nadara Mustard, MD;  Location: [facility] OR;  Service: Orthopedics;  Laterality: Left;  . CATARACT EXTRACTION Left   . HERNIA REPAIR Right   . I & D EXTREMITY Left 05/12/2020   Procedure: DEBRIDEMENT LEFT ANKLE & REMOVAL DEEP HARDWARE;  Surgeon: Nadara Mustard, MD;  Location: [facility] OR;  Service: Orthopedics;  Laterality: Left;  . I & D EXTREMITY  Left 05/21/2020   Procedure: REPEAT IRRIGATION AND DEBRIDEMENT LEFT ANKLE;  Surgeon: Nadara Mustard, MD;  Location: [facility] OR;  Service: Orthopedics;  Laterality: Left;  . KNEE SURGERY Right    cartilage   Social History   Occupational History  . Not on file  Tobacco Use  . Smoking status: Current Some Day Smoker    Types: Cigars  . Smokeless tobacco: Never Used  . Tobacco comment: random  Vaping Use  . Vaping Use: Never used  Substance and Sexual Activity  . Alcohol use: Yes    Alcohol/week: 9.0 standard drinks    Types: 4 Glasses of wine, 5 Cans of beer per week    Comment: 05/11/20- none at times  . Drug use: No  . Sexual activity: Not on file

## 2020-06-11 ENCOUNTER — Other Ambulatory Visit: Payer: Self-pay

## 2020-06-11 ENCOUNTER — Encounter: Payer: Self-pay | Admitting: Physician Assistant

## 2020-06-11 ENCOUNTER — Ambulatory Visit (INDEPENDENT_AMBULATORY_CARE_PROVIDER_SITE_OTHER): Payer: Self-pay | Admitting: Physician Assistant

## 2020-06-11 VITALS — Ht 72.0 in | Wt 210.0 lb

## 2020-06-11 DIAGNOSIS — M71072 Abscess of bursa, left ankle and foot: Secondary | ICD-10-CM

## 2020-06-11 DIAGNOSIS — T847XXA Infection and inflammatory reaction due to other internal orthopedic prosthetic devices, implants and grafts, initial encounter: Secondary | ICD-10-CM

## 2020-06-11 NOTE — Progress Notes (Signed)
Office Visit Note   Patient: Travis Mayo           Date of Birth: 25-Nov-1960           MRN: [id number] Visit Date: 06/11/2020              Requested by: Felix Pacini, FNP [address] [place],  [place] [address] PCP: Felix Pacini, FNP  Chief Complaint  Patient presents with  . Left Ankle - Follow-up    S/p hardware removal and skin graft to left foot/ankle      HPI: This is a pleasant gentleman who is status post irrigation and debridement with skin grafting of his ankle.  He has been undergoing Profore wraps to decrease the swelling in his foot and ankle he is here to have the wrap changed  Assessment & Plan: Visit Diagnoses: No diagnosis found.  Plan: Definitely decreased in swelling with some wrinkling in the foot and ankle.  Improved from earlier this week.  Follow-up on Tuesday  Follow-Up Instructions: No follow-ups on file.   Ortho Exam  Patient is alert, oriented, no adenopathy, well-dressed, normal affect, normal respiratory effort. Focused examination demonstrates graft in place with good vascular tissue growing beneath.  There is wrinkling of the skin in the ankle.  Still swelling in the in the heel and the top of the foot.  No cellulitis no sign of infection no foul odor  Imaging: No results found. No images are attached to the encounter.  Labs: Lab Results  Component Value Date   ESRSEDRATE 106 (H) 05/20/2020   ESRSEDRATE 29 (H) 03/10/2020   CRP 6.8 (H) 05/20/2020   CRP 1.2 (H) 03/10/2020   REPTSTATUS 05/18/2020 FINAL 05/12/2020   GRAMSTAIN NO WBC SEEN RARE GRAM POSITIVE COCCI IN PAIRS  05/12/2020   CULT  05/12/2020    MODERATE STAPHYLOCOCCUS AUREUS NO ANAEROBES ISOLATED Performed at [facility], [address] [place], [place] [address]    [facility] STAPHYLOCOCCUS AUREUS 05/12/2020     Lab Results  Component Value Date   ALBUMIN 3.1 (L) 05/12/2020   ALBUMIN 3.8 03/10/2020   ALBUMIN 4.1 10/07/2018    No results  found for: MG No results found for: VD25OH  No results found for: PREALBUMIN CBC EXTENDED Latest Ref Rng & Units 05/18/2020 05/12/2020 03/26/2020  WBC 4.0 - 10.5 K/uL 7.4 15.6(H) 10.4  RBC 4.22 - 5.81 MIL/uL 3.47(L) 4.41 4.20(L)  HGB 13.0 - 17.0 g/dL 1.4(E) 12.7(L) 12.7(L)  HCT 39 - 52 % 30.0(L) 40.3 39.1  PLT 150 - 400 K/uL 584(H) 469(H) 458(H)  NEUTROABS 1.7 - 7.7 K/uL 5.5 - -  LYMPHSABS 0.7 - 4.0 K/uL 0.9 - -     Body mass index is 28.48 kg/m.  Orders:  No orders of the defined types were placed in this encounter.  No orders of the defined types were placed in this encounter.    Procedures: No procedures performed  Clinical Data: No additional findings.  ROS:  All other systems negative, except as noted in the HPI. Review of Systems  Objective: Vital Signs: Ht 6' (1.829 m)   Wt 210 lb (95.3 kg)   BMI 28.48 kg/m   Specialty Comments:  No specialty comments available.  PMFS History: Patient Active Problem List   Diagnosis Date Noted  . MSSA (methicillin susceptible Staphylococcus aureus) infection 05/18/2020  . Cardiac murmur due to mitral valve disorder 05/18/2020  . Abscess of bursa of left ankle 05/12/2020  . Hardware complicating  wound infection (HCC)   . Left ankle pain 03/26/2020  . Charcot's joint, left ankle and foot    Past Medical History:  Diagnosis Date  . Arthritis   . Cataract   . Charcot's arthropathy    left  . Hypertension     History reviewed. No pertinent family history.  Past Surgical History:  Procedure Laterality Date  . ANKLE FUSION Left 03/26/2020   Procedure: LEFT TIBIOCALCANEAL FUSION;  Surgeon: Nadara Mustard, MD;  Location: [facility] OR;  Service: Orthopedics;  Laterality: Left;  . CATARACT EXTRACTION Left   . HERNIA REPAIR Right   . I & D EXTREMITY Left 05/12/2020   Procedure: DEBRIDEMENT LEFT ANKLE & REMOVAL DEEP HARDWARE;  Surgeon: Nadara Mustard, MD;  Location: [facility] OR;  Service: Orthopedics;  Laterality: Left;  . I & D  EXTREMITY Left 05/21/2020   Procedure: REPEAT IRRIGATION AND DEBRIDEMENT LEFT ANKLE;  Surgeon: Nadara Mustard, MD;  Location: [facility] OR;  Service: Orthopedics;  Laterality: Left;  . KNEE SURGERY Right    cartilage   Social History   Occupational History  . Not on file  Tobacco Use  . Smoking status: Current Some Day Smoker    Types: Cigars  . Smokeless tobacco: Never Used  . Tobacco comment: random  Vaping Use  . Vaping Use: Never used  Substance and Sexual Activity  . Alcohol use: Yes    Alcohol/week: 9.0 standard drinks    Types: 4 Glasses of wine, 5 Cans of beer per week    Comment: 05/11/20- none at times  . Drug use: No  . Sexual activity: Not on file

## 2020-06-15 ENCOUNTER — Ambulatory Visit (INDEPENDENT_AMBULATORY_CARE_PROVIDER_SITE_OTHER): Payer: Self-pay | Admitting: Internal Medicine

## 2020-06-15 ENCOUNTER — Other Ambulatory Visit: Payer: Self-pay

## 2020-06-15 ENCOUNTER — Encounter: Payer: Self-pay | Admitting: Internal Medicine

## 2020-06-15 ENCOUNTER — Ambulatory Visit (INDEPENDENT_AMBULATORY_CARE_PROVIDER_SITE_OTHER): Payer: Self-pay | Admitting: Orthopedic Surgery

## 2020-06-15 ENCOUNTER — Encounter: Payer: Self-pay | Admitting: Physician Assistant

## 2020-06-15 VITALS — Ht 72.0 in | Wt 210.0 lb

## 2020-06-15 VITALS — BP 175/130 | HR 125 | Temp 98.0°F | Ht 72.0 in

## 2020-06-15 DIAGNOSIS — T847XXA Infection and inflammatory reaction due to other internal orthopedic prosthetic devices, implants and grafts, initial encounter: Secondary | ICD-10-CM

## 2020-06-15 DIAGNOSIS — A4901 Methicillin susceptible Staphylococcus aureus infection, unspecified site: Secondary | ICD-10-CM

## 2020-06-15 DIAGNOSIS — I10 Essential (primary) hypertension: Secondary | ICD-10-CM

## 2020-06-15 MED ORDER — HYDROCHLOROTHIAZIDE 25 MG PO TABS: 25.0000 mg | ORAL_TABLET | Freq: Every day | ORAL | 1 refills | Status: DC

## 2020-06-15 NOTE — Progress Notes (Signed)
Subjective:    Patient ID: Travis Mayo, male    DOB: 11/02/1960, 59 y.o.   MRN: [id number]  Chief Complaint  Patient presents with  . Follow-up     HPI:  Travis Mayo is a 59 y.o. male presents today for follow up regarding MSSA of left lower extremity. S/P skin graft.  Wound wrapped, wearing boot and using crutches appropriatly.  He was seen by Dr. Sharol Given for wound today 06/14/2020 whom patient states was given a good report. He is tolerating Ancef well,no new complaints.    Hypertension-no current PCP though plans to establish this month. No previous history of HTN and denies cardiac symptoms. Pt reports BP has been elevated at home when taken by home nurse.  Prior to injury very active and non smoker.    No Known Allergies    Outpatient Medications Prior to Visit  Medication Sig Dispense Refill  . Ascorbic Acid (VITAMIN C) 1000 MG tablet Take 1,000 mg by mouth daily.    Marland Kitchen ceFAZolin (ANCEF) IVPB Inject 2 g into the vein every 8 (eight) hours. Indication:  Wound Infection First Dose: yes Last Day of Therapy:  06/23/2020 Labs - Once weekly:  CBC/D and BMP, Labs - Every other week:  ESR and CRP Method of administration: IV Push Method of administration may be changed at the discretion of home infusion pharmacist based upon assessment of the patient and/or caregiver's ability to self-administer the medication ordered. 37 Units 0  . Cholecalciferol (VITAMIN D3 PO) Take 1 tablet by mouth daily.    Marland Kitchen ibuprofen (ADVIL,MOTRIN) 200 MG tablet Take 800 mg by mouth every 6 (six) hours as needed (for inflammation).     . silver sulfADIAZINE (SILVADENE) 1 % cream Apply 1 application topically daily. 50 g 0  . doxycycline (VIBRA-TABS) 100 MG tablet TAKE 1 TABLET BY MOUTH TWICE A DAY (Patient not taking: Reported on 06/15/2020) 60 tablet 0  . moxifloxacin (VIGAMOX) 0.5 % ophthalmic solution Apply to eye daily. (Patient not taking: Reported on 06/15/2020)    . oxyCODONE (OXY IR/ROXICODONE) 5 MG  immediate release tablet Take 1-2 tablets (5-10 mg total) by mouth every 4 (four) hours as needed for moderate pain (pain score 4-6). (Patient not taking: Reported on 06/15/2020) 30 tablet 0   No facility-administered medications prior to visit.     Past Medical History:  Diagnosis Date  . Arthritis   . Cataract   . Charcot's arthropathy    left  . Hypertension      Past Surgical History:  Procedure Laterality Date  . ANKLE FUSION Left 03/26/2020   Procedure: LEFT TIBIOCALCANEAL FUSION;  Surgeon: Newt Minion, MD;  Location: [facility];  Service: Orthopedics;  Laterality: Left;  . CATARACT EXTRACTION Left   . HERNIA REPAIR Right   . I & D EXTREMITY Left 05/12/2020   Procedure: DEBRIDEMENT LEFT ANKLE & REMOVAL DEEP HARDWARE;  Surgeon: Newt Minion, MD;  Location: [facility];  Service: Orthopedics;  Laterality: Left;  . I & D EXTREMITY Left 05/21/2020   Procedure: REPEAT IRRIGATION AND DEBRIDEMENT LEFT ANKLE;  Surgeon: Newt Minion, MD;  Location: [facility];  Service: Orthopedics;  Laterality: Left;  . KNEE SURGERY Right    cartilage       Review of Systems  Constitutional: Negative for appetite change, chills, diaphoresis, fatigue and fever.  Respiratory: Negative for shortness of breath.   Cardiovascular: Negative for chest pain.  Gastrointestinal: Negative for abdominal pain, diarrhea, nausea and vomiting.  Musculoskeletal:  Positive for gait problem and joint swelling.       Using crutches and wearing boot for ambulation  Skin: Positive for wound.  Neurological: Negative for dizziness, weakness, light-headedness, numbness and headaches.  Hematological: Negative for adenopathy. Does not bruise/bleed easily.  Psychiatric/Behavioral: Negative.       Objective:    BP (!) 175/130   Pulse (!) 125   Temp 98 F (36.7 C) (Oral)   Ht 6' (1.829 m)   SpO2 99%   BMI 28.48 kg/m  Nursing note and vital signs reviewed.  Physical Exam Vitals reviewed.  Constitutional:      General:  He is awake.     Appearance: Normal appearance.  Cardiovascular:     Rate and Rhythm: Normal rate and regular rhythm.  Pulmonary:     Effort: Pulmonary effort is normal.     Breath sounds: Normal breath sounds.  Feet:     Comments: Left ankle wrapped and wearing boot. Using crutches to ambulate. Skin:    Comments: Wound wrapped and wearing a boot-visualized with photos from patient.  Appeared to be healing well. Graft intact, no discharge noted on photo.  Neurological:     General: No focal deficit present.     Mental Status: He is alert and oriented to person, place, and time.  Psychiatric:        Mood and Affect: Mood normal.      Depression screen [facility] 2/9 06/15/2020  Decreased Interest 0  Down, Depressed, Hopeless 0  PHQ - 2 Score 0       Assessment & Plan:   MSSA-improving, continue ancef-ESR, CRP today PIC line scheduled for removal 06/24/2020 HTN-Start HCTZ 25 mg Establish with PCP Follow up in 1 month.  Patient Active Problem List   Diagnosis Date Noted  . MSSA (methicillin susceptible Staphylococcus aureus) infection 05/18/2020  . Cardiac murmur due to mitral valve disorder 05/18/2020  . Abscess of bursa of left ankle 05/12/2020  . Hardware complicating wound infection ([facility])   . Left ankle pain 03/26/2020  . Charcot's joint, left ankle and foot      Problem List Items Addressed This Visit    None       I am having Travis Mayo "Travis Mayo" maintain his ibuprofen, vitamin C, Cholecalciferol (VITAMIN D3 PO), ceFAZolin, oxyCODONE, silver sulfADIAZINE, doxycycline, and moxifloxacin.   No orders of the defined types were placed in this encounter.    Follow-up: No follow-ups on file.

## 2020-06-15 NOTE — Patient Instructions (Addendum)
Return 06/24/2020 for [facility] line removal Continue current medications as instructed Start hydrochlorothiazide 25 mg Monitor blood pressure at home after resting for 5 minutes Establish primary care  Hypertension, Adult High blood pressure (hypertension) is when the force of blood pumping through the arteries is too strong. The arteries are the blood vessels that carry blood from the heart throughout the body. Hypertension forces the heart to work harder to pump blood and may cause arteries to become narrow or stiff. Untreated or uncontrolled hypertension can cause a heart attack, heart failure, a stroke, kidney disease, and other problems. A blood pressure reading consists of a higher number over a lower number. Ideally, your blood pressure should be below 120/80. The first ("top") number is called the systolic pressure. It is a measure of the pressure in your arteries as your heart beats. The second ("bottom") number is called the diastolic pressure. It is a measure of the pressure in your arteries as the heart relaxes. What are the causes? The exact cause of this condition is not known. There are some conditions that result in or are related to high blood pressure. What increases the risk? Some risk factors for high blood pressure are under your control. The following factors may make you more likely to develop this condition:  Smoking.  Having type 2 diabetes mellitus, high cholesterol, or both.  Not getting enough exercise or physical activity.  Being overweight.  Having too much fat, sugar, calories, or salt (sodium) in your diet.  Drinking too much alcohol. Some risk factors for high blood pressure may be difficult or impossible to change. Some of these factors include:  Having chronic kidney disease.  Having a family history of high blood pressure.  Age. Risk increases with age.  Race. You may be at higher risk if you are African American.  Gender. Men are at higher risk than  women before age 51. After age 72, women are at higher risk than men.  Having obstructive sleep apnea.  Stress. What are the signs or symptoms? High blood pressure may not cause symptoms. Very high blood pressure (hypertensive crisis) may cause:  Headache.  Anxiety.  Shortness of breath.  Nosebleed.  Nausea and vomiting.  Vision changes.  Severe chest pain.  Seizures. How is this diagnosed? This condition is diagnosed by measuring your blood pressure while you are seated, with your arm resting on a flat surface, your legs uncrossed, and your feet flat on the floor. The cuff of the blood pressure monitor will be placed directly against the skin of your upper arm at the level of your heart. It should be measured at least twice using the same arm. Certain conditions can cause a difference in blood pressure between your right and left arms. Certain factors can cause blood pressure readings to be lower or higher than normal for a short period of time:  When your blood pressure is higher when you are in a health care provider's office than when you are at home, this is called white coat hypertension. Most people with this condition do not need medicines.  When your blood pressure is higher at home than when you are in a health care provider's office, this is called masked hypertension. Most people with this condition may need medicines to control blood pressure. If you have a high blood pressure reading during one visit or you have normal blood pressure with other risk factors, you may be asked to:  Return on a different day to have  your blood pressure checked again.  Monitor your blood pressure at home for 1 week or longer. If you are diagnosed with hypertension, you may have other blood or imaging tests to help your health care provider understand your overall risk for other conditions. How is this treated? This condition is treated by making healthy lifestyle changes, such as eating  healthy foods, exercising more, and reducing your alcohol intake. Your health care provider may prescribe medicine if lifestyle changes are not enough to get your blood pressure under control, and if:  Your systolic blood pressure is above 130.  Your diastolic blood pressure is above 80. Your personal target blood pressure may vary depending on your medical conditions, your age, and other factors. Follow these instructions at home: Eating and drinking   Eat a diet that is high in fiber and potassium, and low in sodium, added sugar, and fat. An example eating plan is called the DASH (Dietary Approaches to Stop Hypertension) diet. To eat this way: ? Eat plenty of fresh fruits and vegetables. Try to fill one half of your plate at each meal with fruits and vegetables. ? Eat whole grains, such as whole-wheat pasta, brown rice, or whole-grain bread. Fill about one fourth of your plate with whole grains. ? Eat or drink low-fat dairy products, such as skim milk or low-fat yogurt. ? Avoid fatty cuts of meat, processed or cured meats, and poultry with skin. Fill about one fourth of your plate with lean proteins, such as fish, chicken without skin, beans, eggs, or tofu. ? Avoid pre-made and processed foods. These tend to be higher in sodium, added sugar, and fat.  Reduce your daily sodium intake. Most people with hypertension should eat less than 1,500 mg of sodium a day.  Do not drink alcohol if: ? Your health care provider tells you not to drink. ? You are pregnant, may be pregnant, or are planning to become pregnant.  If you drink alcohol: ? Limit how much you use to:  0-1 drink a day for women.  0-2 drinks a day for men. ? Be aware of how much alcohol is in your drink. In the [place] one drink equals one 12 oz bottle of beer (355 mL), one 5 oz glass of wine (148 mL), or one 1 oz glass of hard liquor (44 mL). Lifestyle   Work with your health care provider to maintain a healthy body weight or  to lose weight. Ask what an ideal weight is for you.  Get at least 30 minutes of exercise most days of the week. Activities may include walking, swimming, or biking.  Include exercise to strengthen your muscles (resistance exercise), such as Pilates or lifting weights, as part of your weekly exercise routine. Try to do these types of exercises for 30 minutes at least 3 days a week.  Do not use any products that contain nicotine or tobacco, such as cigarettes, e-cigarettes, and chewing tobacco. If you need help quitting, ask your health care provider.  Monitor your blood pressure at home as told by your health care provider.  Keep all follow-up visits as told by your health care provider. This is important. Medicines  Take over-the-counter and prescription medicines only as told by your health care provider. Follow directions carefully. Blood pressure medicines must be taken as prescribed.  Do not skip doses of blood pressure medicine. Doing this puts you at risk for problems and can make the medicine less effective.  Ask your health care provider about  side effects or reactions to medicines that you should watch for. Contact a health care provider if you:  Think you are having a reaction to a medicine you are taking.  Have headaches that keep coming back (recurring).  Feel dizzy.  Have swelling in your ankles.  Have trouble with your vision. Get help right away if you:  Develop a severe headache or confusion.  Have unusual weakness or numbness.  Feel faint.  Have severe pain in your chest or abdomen.  Vomit repeatedly.  Have trouble breathing. Summary  Hypertension is when the force of blood pumping through your arteries is too strong. If this condition is not controlled, it may put you at risk for serious complications.  Your personal target blood pressure may vary depending on your medical conditions, your age, and other factors. For most people, a normal blood  pressure is less than 120/80.  Hypertension is treated with lifestyle changes, medicines, or a combination of both. Lifestyle changes include losing weight, eating a healthy, low-sodium diet, exercising more, and limiting alcohol. This information is not intended to replace advice given to you by your health care provider. Make sure you discuss any questions you have with your health care provider. Document Revised: 05/29/2018 Document Reviewed: 05/29/2018 Elsevier Patient Education  2020 [employer].

## 2020-06-16 LAB — SEDIMENTATION RATE: Sed Rate: 9 mm/h (ref 0–20)

## 2020-06-16 LAB — FUNGUS CULTURE WITH STAIN

## 2020-06-16 LAB — C-REACTIVE PROTEIN: CRP: 2.8 mg/L (ref <8.0)

## 2020-06-16 LAB — FUNGAL ORGANISM REFLEX

## 2020-06-16 LAB — FUNGUS CULTURE RESULT

## 2020-06-21 ENCOUNTER — Ambulatory Visit (INDEPENDENT_AMBULATORY_CARE_PROVIDER_SITE_OTHER): Payer: Self-pay | Admitting: Physician Assistant

## 2020-06-21 ENCOUNTER — Encounter: Payer: Self-pay | Admitting: Orthopedic Surgery

## 2020-06-21 DIAGNOSIS — M14672 Charcot's joint, left ankle and foot: Secondary | ICD-10-CM

## 2020-06-21 NOTE — Progress Notes (Signed)
Office Visit Note   Patient: Travis Mayo           Date of Birth: October 31, 1960           MRN: [id number] Visit Date: 06/21/2020              Requested by: Felix Pacini, FNP [address] [place],  [place] [address] PCP: Felix Pacini, FNP  Chief Complaint  Patient presents with  . Left Ankle - Pain      HPI: Patient is here for follow-up he is 5 weeks.  He has almost completed his antibiotic course.  He is using crutches and nonweightbearing.  He has been getting compression wraps once a week.  Previous to that it was twice a week  Assessment & Plan: Visit Diagnoses: No diagnosis found.  Plan: We are going to try some silver cell and have him wrapped twice a week instead of once a week.  Follow-up on Thursday  Follow-Up Instructions: No follow-ups on file.   Ortho Exam  Patient is alert, oriented, no adenopathy, well-dressed, normal affect, normal respiratory effort. Swelling has decreased in his foot no cellulitis.  Some wrinkling.  The wound itself has quite a bit of drainage.  He does have especially distally good vascular fibrous tissue.  No foul odor no cellulitis or indication of acute infection  Imaging: No results found. No images are attached to the encounter.  Labs: Lab Results  Component Value Date   ESRSEDRATE 9 06/15/2020   ESRSEDRATE 106 (H) 05/20/2020   ESRSEDRATE 29 (H) 03/10/2020   CRP 2.8 06/15/2020   CRP 6.8 (H) 05/20/2020   CRP 1.2 (H) 03/10/2020   REPTSTATUS 05/18/2020 FINAL 05/12/2020   GRAMSTAIN NO WBC SEEN RARE GRAM POSITIVE COCCI IN PAIRS  05/12/2020   CULT  05/12/2020    MODERATE STAPHYLOCOCCUS AUREUS NO ANAEROBES ISOLATED Performed at [facility], [address] [place], [place] [address]    [facility] STAPHYLOCOCCUS AUREUS 05/12/2020     Lab Results  Component Value Date   ALBUMIN 3.1 (L) 05/12/2020   ALBUMIN 3.8 03/10/2020   ALBUMIN 4.1 10/07/2018    No results found for: MG No results found for:  VD25OH  No results found for: PREALBUMIN CBC EXTENDED Latest Ref Rng & Units 05/18/2020 05/12/2020 03/26/2020  WBC 4.0 - 10.5 K/uL 7.4 15.6(H) 10.4  RBC 4.22 - 5.81 MIL/uL 3.47(L) 4.41 4.20(L)  HGB 13.0 - 17.0 g/dL 0.3(K) 12.7(L) 12.7(L)  HCT 39 - 52 % 30.0(L) 40.3 39.1  PLT 150 - 400 K/uL 584(H) 469(H) 458(H)  NEUTROABS 1.7 - 7.7 K/uL 5.5 - -  LYMPHSABS 0.7 - 4.0 K/uL 0.9 - -     There is no height or weight on file to calculate BMI.  Orders:  No orders of the defined types were placed in this encounter.  No orders of the defined types were placed in this encounter.    Procedures: No procedures performed  Clinical Data: No additional findings.  ROS:  All other systems negative, except as noted in the HPI. Review of Systems  Objective: Vital Signs: There were no vitals taken for this visit.  Specialty Comments:  No specialty comments available.  PMFS History: Patient Active Problem List   Diagnosis Date Noted  . MSSA (methicillin susceptible Staphylococcus aureus) infection 05/18/2020  . Cardiac murmur due to mitral valve disorder 05/18/2020  . Abscess of bursa of left ankle 05/12/2020  . Hardware complicating wound infection ([facility])   . Left  ankle pain 03/26/2020  . Charcot's joint, left ankle and foot    Past Medical History:  Diagnosis Date  . Arthritis   . Cataract   . Charcot's arthropathy    left  . Hypertension     History reviewed. No pertinent family history.  Past Surgical History:  Procedure Laterality Date  . ANKLE FUSION Left 03/26/2020   Procedure: LEFT TIBIOCALCANEAL FUSION;  Surgeon: Nadara Mustard, MD;  Location: [facility] OR;  Service: Orthopedics;  Laterality: Left;  . CATARACT EXTRACTION Left   . HERNIA REPAIR Right   . I & D EXTREMITY Left 05/12/2020   Procedure: DEBRIDEMENT LEFT ANKLE & REMOVAL DEEP HARDWARE;  Surgeon: Nadara Mustard, MD;  Location: [facility] OR;  Service: Orthopedics;  Laterality: Left;  . I & D EXTREMITY Left 05/21/2020    Procedure: REPEAT IRRIGATION AND DEBRIDEMENT LEFT ANKLE;  Surgeon: Nadara Mustard, MD;  Location: [facility] OR;  Service: Orthopedics;  Laterality: Left;  . KNEE SURGERY Right    cartilage   Social History   Occupational History  . Not on file  Tobacco Use  . Smoking status: Current Some Day Smoker    Types: Cigars  . Smokeless tobacco: Never Used  . Tobacco comment: random  Vaping Use  . Vaping Use: Never used  Substance and Sexual Activity  . Alcohol use: Yes    Alcohol/week: 9.0 standard drinks    Types: 4 Glasses of wine, 5 Cans of beer per week    Comment: 05/11/20- none at times  . Drug use: No  . Sexual activity: Not on file

## 2020-06-23 ENCOUNTER — Encounter: Payer: Self-pay | Admitting: Orthopedic Surgery

## 2020-06-23 NOTE — Progress Notes (Signed)
Office Visit Note   Patient: Travis Mayo           Date of Birth: [birth year]           MRN: [id number] Visit Date: 06/15/2020              Requested by: Felix Pacini, FNP [address] [place],  [place] [address] PCP: Felix Pacini, FNP  Chief Complaint  Patient presents with  . Left Ankle - Routine Post Op    05/21/20 I&D left ankle STSG       HPI: Patient is a 59 year old gentleman presents in follow-up status post skin graft removal of hardware for infection of deep retained hardware.  He is currently undergoing twice a week compression wraps with silver cell and Promogran/Prisma  Assessment & Plan: Visit Diagnoses:  1. Hardware complicating wound infection, initial encounter [facility])     Plan: Patient's wound continues to show improved granulation tissue there is decreased swelling we will continue with twice a week compression wraps.  Follow-Up Instructions: Return in about 1 week (around 06/22/2020).   Ortho Exam  Patient is alert, oriented, no adenopathy, well-dressed, normal affect, normal respiratory effort. Examination there is decreased swelling in the ankle there is improved granulation tissue the split-thickness skin graft is viable.  We will continue with the compression wraps.  Imaging: No results found. No images are attached to the encounter.  Labs: Lab Results  Component Value Date   ESRSEDRATE 9 06/15/2020   ESRSEDRATE 106 (H) 05/20/2020   ESRSEDRATE 29 (H) 03/10/2020   CRP 2.8 06/15/2020   CRP 6.8 (H) 05/20/2020   CRP 1.2 (H) 03/10/2020   REPTSTATUS 05/18/2020 FINAL 05/12/2020   GRAMSTAIN NO WBC SEEN RARE GRAM POSITIVE COCCI IN PAIRS  05/12/2020   CULT  05/12/2020    MODERATE STAPHYLOCOCCUS AUREUS NO ANAEROBES ISOLATED Performed at [facility], [address], [place], [place] [address]    [facility] STAPHYLOCOCCUS AUREUS 05/12/2020     Lab Results  Component Value Date   ALBUMIN 3.1 (L) 05/12/2020   ALBUMIN 3.8  03/10/2020   ALBUMIN 4.1 10/07/2018    No results found for: MG No results found for: VD25OH  No results found for: PREALBUMIN CBC EXTENDED Latest Ref Rng & Units 05/18/2020 05/12/2020 03/26/2020  WBC 4.0 - 10.5 K/uL 7.4 15.6(H) 10.4  RBC 4.22 - 5.81 MIL/uL 3.47(L) 4.41 4.20(L)  HGB 13.0 - 17.0 g/dL 4.1(P) 12.7(L) 12.7(L)  HCT 39 - 52 % 30.0(L) 40.3 39.1  PLT 150 - 400 K/uL 584(H) 469(H) 458(H)  NEUTROABS 1.7 - 7.7 K/uL 5.5 - -  LYMPHSABS 0.7 - 4.0 K/uL 0.9 - -     Body mass index is 28.48 kg/m.  Orders:  No orders of the defined types were placed in this encounter.  No orders of the defined types were placed in this encounter.    Procedures: No procedures performed  Clinical Data: No additional findings.  ROS:  All other systems negative, except as noted in the HPI. Review of Systems  Objective: Vital Signs: Ht 6' (1.829 m)   Wt 210 lb (95.3 kg)   BMI 28.48 kg/m   Specialty Comments:  No specialty comments available.  PMFS History: Patient Active Problem List   Diagnosis Date Noted  . MSSA (methicillin susceptible Staphylococcus aureus) infection 05/18/2020  . Cardiac murmur due to mitral valve disorder 05/18/2020  . Abscess of bursa of left ankle 05/12/2020  . Hardware complicating wound infection ([facility])   .  Left ankle pain 03/26/2020  . Charcot's joint, left ankle and foot    Past Medical History:  Diagnosis Date  . Arthritis   . Cataract   . Charcot's arthropathy    left  . Hypertension     History reviewed. No pertinent family history.  Past Surgical History:  Procedure Laterality Date  . ANKLE FUSION Left 03/26/2020   Procedure: LEFT TIBIOCALCANEAL FUSION;  Surgeon: Nadara Mustard, MD;  Location: [facility] OR;  Service: Orthopedics;  Laterality: Left;  . CATARACT EXTRACTION Left   . HERNIA REPAIR Right   . I & D EXTREMITY Left 05/12/2020   Procedure: DEBRIDEMENT LEFT ANKLE & REMOVAL DEEP HARDWARE;  Surgeon: Nadara Mustard, MD;  Location: [facility] OR;   Service: Orthopedics;  Laterality: Left;  . I & D EXTREMITY Left 05/21/2020   Procedure: REPEAT IRRIGATION AND DEBRIDEMENT LEFT ANKLE;  Surgeon: Nadara Mustard, MD;  Location: [facility] OR;  Service: Orthopedics;  Laterality: Left;  . KNEE SURGERY Right    cartilage   Social History   Occupational History  . Not on file  Tobacco Use  . Smoking status: Current Some Day Smoker    Types: Cigars  . Smokeless tobacco: Never Used  . Tobacco comment: random  Vaping Use  . Vaping Use: Never used  Substance and Sexual Activity  . Alcohol use: Yes    Alcohol/week: 9.0 standard drinks    Types: 4 Glasses of wine, 5 Cans of beer per week    Comment: 05/11/20- none at times  . Drug use: No  . Sexual activity: Not on file

## 2020-06-24 ENCOUNTER — Encounter: Payer: Self-pay | Admitting: Physician Assistant

## 2020-06-24 ENCOUNTER — Ambulatory Visit (INDEPENDENT_AMBULATORY_CARE_PROVIDER_SITE_OTHER): Payer: Self-pay | Admitting: Physician Assistant

## 2020-06-24 ENCOUNTER — Ambulatory Visit: Payer: Self-pay | Admitting: *Deleted

## 2020-06-24 ENCOUNTER — Other Ambulatory Visit: Payer: Self-pay

## 2020-06-24 VITALS — Ht 72.0 in | Wt 210.0 lb

## 2020-06-24 DIAGNOSIS — A4901 Methicillin susceptible Staphylococcus aureus infection, unspecified site: Secondary | ICD-10-CM

## 2020-06-24 DIAGNOSIS — M14672 Charcot's joint, left ankle and foot: Secondary | ICD-10-CM

## 2020-06-24 NOTE — Progress Notes (Signed)
Office Visit Note   Patient: Travis Mayo           Date of Birth: 1960-10-15           MRN: [id number] Visit Date: 06/24/2020              Requested by: Felix Pacini, FNP [address] [place],  [place] [address] PCP: Felix Pacini, FNP  Chief Complaint  Patient presents with  . Left Ankle - Routine Post Op    05/21/20 repeat I&D left ankle       HPI: Patient is in follow-up 1 month status post repeat irrigation and debridement of his left ankle.  He feels like this time the swelling has significantly gone down even in his forefoot.  He has no complaints.  Of note he did tell me today that he was seen by infectious disease and placed on an antihypertensive  Assessment & Plan: Visit Diagnoses: No diagnosis found.  Plan: We will place Prisma and Profore today.  He has an appointment on Monday.  Hopefully we can discontinue wrapping at that time if he looks good  Follow-Up Instructions: No follow-ups on file.   Ortho Exam  Patient is alert, oriented, no adenopathy, well-dressed, normal affect, normal respiratory effort. Significant decrease in swelling in the forefoot in the hindfoot.  He has wrinkling of his skin.  Good bloody wound bed.  No foul odor wound has decreased in size no cellulitis  Imaging: No results found. No images are attached to the encounter.  Labs: Lab Results  Component Value Date   ESRSEDRATE 9 06/15/2020   ESRSEDRATE 106 (H) 05/20/2020   ESRSEDRATE 29 (H) 03/10/2020   CRP 2.8 06/15/2020   CRP 6.8 (H) 05/20/2020   CRP 1.2 (H) 03/10/2020   REPTSTATUS 05/18/2020 FINAL 05/12/2020   GRAMSTAIN NO WBC SEEN RARE GRAM POSITIVE COCCI IN PAIRS  05/12/2020   CULT  05/12/2020    MODERATE STAPHYLOCOCCUS AUREUS NO ANAEROBES ISOLATED Performed at [facility] Lab, [address], [place], [place] [address]    [facility] STAPHYLOCOCCUS AUREUS 05/12/2020     Lab Results  Component Value Date   ALBUMIN 3.1 (L) 05/12/2020   ALBUMIN 3.8  03/10/2020   ALBUMIN 4.1 10/07/2018    No results found for: MG No results found for: VD25OH  No results found for: PREALBUMIN CBC EXTENDED Latest Ref Rng & Units 05/18/2020 05/12/2020 03/26/2020  WBC 4.0 - 10.5 K/uL 7.4 15.6(H) 10.4  RBC 4.22 - 5.81 MIL/uL 3.47(L) 4.41 4.20(L)  HGB 13.0 - 17.0 g/dL 8.0(D) 12.7(L) 12.7(L)  HCT 39 - 52 % 30.0(L) 40.3 39.1  PLT 150 - 400 K/uL 584(H) 469(H) 458(H)  NEUTROABS 1.7 - 7.7 K/uL 5.5 - -  LYMPHSABS 0.7 - 4.0 K/uL 0.9 - -     Body mass index is 28.48 kg/m.  Orders:  No orders of the defined types were placed in this encounter.  No orders of the defined types were placed in this encounter.    Procedures: No procedures performed  Clinical Data: No additional findings.  ROS:  All other systems negative, except as noted in the HPI. Review of Systems  Objective: Vital Signs: Ht 6' (1.829 m)   Wt 210 lb (95.3 kg)   BMI 28.48 kg/m   Specialty Comments:  No specialty comments available.  PMFS History: Patient Active Problem List   Diagnosis Date Noted  . MSSA (methicillin susceptible Staphylococcus aureus) infection 05/18/2020  . Cardiac murmur due to mitral  valve disorder 05/18/2020  . Abscess of bursa of left ankle 05/12/2020  . Hardware complicating wound infection (HCC)   . Left ankle pain 03/26/2020  . Charcot's joint, left ankle and foot    Past Medical History:  Diagnosis Date  . Arthritis   . Cataract   . Charcot's arthropathy    left  . Hypertension     No family history on file.  Past Surgical History:  Procedure Laterality Date  . ANKLE FUSION Left 03/26/2020   Procedure: LEFT TIBIOCALCANEAL FUSION;  Surgeon: Nadara Mustard, MD;  Location: [facility] OR;  Service: Orthopedics;  Laterality: Left;  . CATARACT EXTRACTION Left   . HERNIA REPAIR Right   . I & D EXTREMITY Left 05/12/2020   Procedure: DEBRIDEMENT LEFT ANKLE & REMOVAL DEEP HARDWARE;  Surgeon: Nadara Mustard, MD;  Location: [facility] OR;  Service: Orthopedics;   Laterality: Left;  . I & D EXTREMITY Left 05/21/2020   Procedure: REPEAT IRRIGATION AND DEBRIDEMENT LEFT ANKLE;  Surgeon: Nadara Mustard, MD;  Location: [facility] OR;  Service: Orthopedics;  Laterality: Left;  . KNEE SURGERY Right    cartilage   Social History   Occupational History  . Not on file  Tobacco Use  . Smoking status: Current Some Day Smoker    Types: Cigars  . Smokeless tobacco: Never Used  . Tobacco comment: random  Vaping Use  . Vaping Use: Never used  Substance and Sexual Activity  . Alcohol use: Yes    Alcohol/week: 9.0 standard drinks    Types: 4 Glasses of wine, 5 Cans of beer per week    Comment: 05/11/20- none at times  . Drug use: No  . Sexual activity: Not on file

## 2020-06-24 NOTE — Progress Notes (Signed)
Per verbal order from Arvilla Meres, PA, 41 cm Single Lumen Peripherally Inserted Central Catheter removed from right basilic, tip intact. No sutures present. RN confirmed length per chart. Dressing was clean and dry. Petroleum dressing applied. Pt advised no heavy lifting with this arm, leave dressing for 24 hours and call the office or seek emergent care if dressing becomes soaked with blood or sharp pain presents. Patient verbalized understanding and agreement.  Patient's questions answered to their satisfaction. Patient tolerated procedure well, RN walked patient to check out. Andree Coss, RN

## 2020-06-28 ENCOUNTER — Encounter: Payer: Self-pay | Admitting: Orthopedic Surgery

## 2020-06-28 ENCOUNTER — Ambulatory Visit (INDEPENDENT_AMBULATORY_CARE_PROVIDER_SITE_OTHER): Payer: Self-pay | Admitting: Orthopedic Surgery

## 2020-06-28 VITALS — Ht 72.0 in | Wt 210.0 lb

## 2020-06-28 DIAGNOSIS — T847XXA Infection and inflammatory reaction due to other internal orthopedic prosthetic devices, implants and grafts, initial encounter: Secondary | ICD-10-CM

## 2020-06-28 LAB — ACID FAST CULTURE WITH REFLEXED SENSITIVITIES (MYCOBACTERIA): Acid Fast Culture: NEGATIVE

## 2020-06-28 NOTE — Progress Notes (Signed)
Office Visit Note   Patient: Travis Mayo           Date of Birth: 1961/04/26           MRN: [id number] Visit Date: 06/28/2020              Requested by: Felix Pacini, FNP [address] [place],  [place] [address] PCP: Felix Pacini, FNP  Chief Complaint  Patient presents with  . Left Ankle - Routine Post Op    05/21/20 repeat I&D left ankle       HPI: Patient is a 59 year old gentleman who presents in follow-up status post removal of deep retained hardware from infection from fusion for Charcot arthropathy.  Patient states he was started on blood pressure medicine hydrochlorothiazide at infectious disease he states he just finished hopping into the office when his blood pressure was taken.  Patient states he completed his IV antibiotics and the PICC line was removed last week.  Assessment & Plan: Visit Diagnoses:  1. Hardware complicating wound infection, initial encounter [facility])     Plan: Patient is showing excellent improvement recommended he continue with his protein supplement recommended omega-3 supplement recommended that he check his blood pressure in the morning before he starts hopping around.  Continue with twice a week dressing changes with Dynaflex and Prisma.  Follow-Up Instructions: Return in about 1 week (around 07/05/2020) for thursday, monday.   Ortho Exam  Patient is alert, oriented, no adenopathy, well-dressed, normal affect, normal respiratory effort. Examination patient continues to show excellent improvement of the swelling with decreased swelling there is improved granulation tissue over the wound bed.  We will continue with the current care discussed that if we have any wound breakdown or decreased in the wound healing we could consider treatment with other wound care products.  Patient sed rate over 1 month has decreased from 10 6-9.  Imaging: No results found. No images are attached to the encounter.  Labs: Lab Results  Component  Value Date   ESRSEDRATE 9 06/15/2020   ESRSEDRATE 106 (H) 05/20/2020   ESRSEDRATE 29 (H) 03/10/2020   CRP 2.8 06/15/2020   CRP 6.8 (H) 05/20/2020   CRP 1.2 (H) 03/10/2020   REPTSTATUS 05/18/2020 FINAL 05/12/2020   GRAMSTAIN NO WBC SEEN RARE GRAM POSITIVE COCCI IN PAIRS  05/12/2020   CULT  05/12/2020    MODERATE STAPHYLOCOCCUS AUREUS NO ANAEROBES ISOLATED Performed at [facility], [address] [place], [place] [address]    [facility] STAPHYLOCOCCUS AUREUS 05/12/2020     Lab Results  Component Value Date   ALBUMIN 3.1 (L) 05/12/2020   ALBUMIN 3.8 03/10/2020   ALBUMIN 4.1 10/07/2018    No results found for: MG No results found for: VD25OH  No results found for: PREALBUMIN CBC EXTENDED Latest Ref Rng & Units 05/18/2020 05/12/2020 03/26/2020  WBC 4.0 - 10.5 K/uL 7.4 15.6(H) 10.4  RBC 4.22 - 5.81 MIL/uL 3.47(L) 4.41 4.20(L)  HGB 13.0 - 17.0 g/dL 8.2(N) 12.7(L) 12.7(L)  HCT 39 - 52 % 30.0(L) 40.3 39.1  PLT 150 - 400 K/uL 584(H) 469(H) 458(H)  NEUTROABS 1.7 - 7.7 K/uL 5.5 - -  LYMPHSABS 0.7 - 4.0 K/uL 0.9 - -     Body mass index is 28.48 kg/m.  Orders:  No orders of the defined types were placed in this encounter.  No orders of the defined types were placed in this encounter.    Procedures: No procedures performed  Clinical Data: No additional findings.  ROS:  All other systems negative, except as noted in the HPI. Review of Systems  Objective: Vital Signs: Ht 6' (1.829 m)   Wt 210 lb (95.3 kg)   BMI 28.48 kg/m   Specialty Comments:  No specialty comments available.  PMFS History: Patient Active Problem List   Diagnosis Date Noted  . MSSA (methicillin susceptible Staphylococcus aureus) infection 05/18/2020  . Cardiac murmur due to mitral valve disorder 05/18/2020  . Abscess of bursa of left ankle 05/12/2020  . Hardware complicating wound infection (HCC)   . Left ankle pain 03/26/2020  . Charcot's joint, left ankle and foot    Past Medical  History:  Diagnosis Date  . Arthritis   . Cataract   . Charcot's arthropathy    left  . Hypertension     History reviewed. No pertinent family history.  Past Surgical History:  Procedure Laterality Date  . ANKLE FUSION Left 03/26/2020   Procedure: LEFT TIBIOCALCANEAL FUSION;  Surgeon: Nadara Mustard, MD;  Location: [facility] OR;  Service: Orthopedics;  Laterality: Left;  . CATARACT EXTRACTION Left   . HERNIA REPAIR Right   . I & D EXTREMITY Left 05/12/2020   Procedure: DEBRIDEMENT LEFT ANKLE & REMOVAL DEEP HARDWARE;  Surgeon: Nadara Mustard, MD;  Location: [facility] OR;  Service: Orthopedics;  Laterality: Left;  . I & D EXTREMITY Left 05/21/2020   Procedure: REPEAT IRRIGATION AND DEBRIDEMENT LEFT ANKLE;  Surgeon: Nadara Mustard, MD;  Location: [facility] OR;  Service: Orthopedics;  Laterality: Left;  . KNEE SURGERY Right    cartilage   Social History   Occupational History  . Not on file  Tobacco Use  . Smoking status: Current Some Day Smoker    Types: Cigars  . Smokeless tobacco: Never Used  . Tobacco comment: random  Vaping Use  . Vaping Use: Never used  Substance and Sexual Activity  . Alcohol use: Yes    Alcohol/week: 9.0 standard drinks    Types: 4 Glasses of wine, 5 Cans of beer per week    Comment: 05/11/20- none at times  . Drug use: No  . Sexual activity: Not on file

## 2020-07-01 ENCOUNTER — Other Ambulatory Visit: Payer: Self-pay

## 2020-07-01 ENCOUNTER — Ambulatory Visit (INDEPENDENT_AMBULATORY_CARE_PROVIDER_SITE_OTHER): Payer: Self-pay | Admitting: Physician Assistant

## 2020-07-01 ENCOUNTER — Encounter: Payer: Self-pay | Admitting: Orthopedic Surgery

## 2020-07-01 DIAGNOSIS — Z945 Skin transplant status: Secondary | ICD-10-CM

## 2020-07-01 MED ORDER — LISINOPRIL 10 MG PO TABS: 10.0000 mg | ORAL_TABLET | Freq: Every day | ORAL | 2 refills | Status: AC

## 2020-07-01 NOTE — Progress Notes (Signed)
Office Visit Note   Patient: Travis Mayo           Date of Birth: [birth year]           MRN: [id number] Visit Date: 07/01/2020              Requested by: Felix Pacini, FNP [address] [place],  [place] [address] PCP: Felix Pacini, FNP  Chief Complaint  Patient presents with  . Left Ankle - Follow-up      HPI: Patient presents today in follow-up for his right ankle wound.  He is doing well and has been coming in twice a week for Dynaflex wrappings.  He also is still having trouble with his blood pressure.  He is currently on hydrochlorothiazide.  His blood pressures are still averaging 178/115 he denies any headache  Assessment & Plan: Visit Diagnoses: No diagnosis found.  Plan: Patient was evaluated by Dr. Lajoyce Corners.  We will place him on lisinopril 10 mg p.o. daily.  Talked about the side effects with him.  We will follow up on Monday.  We will put him in Dynaflex with Prisma today.  Sutures were removed  Follow-Up Instructions: No follow-ups on file.   Ortho Exam  Patient is alert, oriented, no adenopathy, well-dressed, normal affect, normal respiratory effort. Focused examination healthy granulation vascular tissue.  Sutures are now loose.  He does have wrinkling of the skin no surrounding cellulitis some bloody drainage swelling is much better controlled.  Imaging: No results found. No images are attached to the encounter.  Labs: Lab Results  Component Value Date   ESRSEDRATE 9 06/15/2020   ESRSEDRATE 106 (H) 05/20/2020   ESRSEDRATE 29 (H) 03/10/2020   CRP 2.8 06/15/2020   CRP 6.8 (H) 05/20/2020   CRP 1.2 (H) 03/10/2020   REPTSTATUS 05/18/2020 FINAL 05/12/2020   GRAMSTAIN NO WBC SEEN RARE GRAM POSITIVE COCCI IN PAIRS  05/12/2020   CULT  05/12/2020    MODERATE STAPHYLOCOCCUS AUREUS NO ANAEROBES ISOLATED Performed at [facility] Lab, [address] [place], [place] [address]    [facility] STAPHYLOCOCCUS AUREUS 05/12/2020     Lab Results    Component Value Date   ALBUMIN 3.1 (L) 05/12/2020   ALBUMIN 3.8 03/10/2020   ALBUMIN 4.1 10/07/2018    No results found for: MG No results found for: VD25OH  No results found for: PREALBUMIN CBC EXTENDED Latest Ref Rng & Units 05/18/2020 05/12/2020 03/26/2020  WBC 4.0 - 10.5 K/uL 7.4 15.6(H) 10.4  RBC 4.22 - 5.81 MIL/uL 3.47(L) 4.41 4.20(L)  HGB 13.0 - 17.0 g/dL 3.2(K) 12.7(L) 12.7(L)  HCT 39 - 52 % 30.0(L) 40.3 39.1  PLT 150 - 400 K/uL 584(H) 469(H) 458(H)  NEUTROABS 1.7 - 7.7 K/uL 5.5 - -  LYMPHSABS 0.7 - 4.0 K/uL 0.9 - -     There is no height or weight on file to calculate BMI.  Orders:  No orders of the defined types were placed in this encounter.  Meds ordered this encounter  Medications  . lisinopril (ZESTRIL) 10 MG tablet    Sig: Take 1 tablet (10 mg total) by mouth daily.    Dispense:  30 tablet    Refill:  2     Procedures: No procedures performed  Clinical Data: No additional findings.  ROS:  All other systems negative, except as noted in the HPI. Review of Systems  Objective: Vital Signs: There were no vitals taken for this visit.  Specialty Comments:  No specialty comments  available.  PMFS History: Patient Active Problem List   Diagnosis Date Noted  . MSSA (methicillin susceptible Staphylococcus aureus) infection 05/18/2020  . Cardiac murmur due to mitral valve disorder 05/18/2020  . Abscess of bursa of left ankle 05/12/2020  . Hardware complicating wound infection (HCC)   . Left ankle pain 03/26/2020  . Charcot's joint, left ankle and foot    Past Medical History:  Diagnosis Date  . Arthritis   . Cataract   . Charcot's arthropathy    left  . Hypertension     History reviewed. No pertinent family history.  Past Surgical History:  Procedure Laterality Date  . ANKLE FUSION Left 03/26/2020   Procedure: LEFT TIBIOCALCANEAL FUSION;  Surgeon: Nadara Mustard, MD;  Location: [facility] OR;  Service: Orthopedics;  Laterality: Left;  . CATARACT  EXTRACTION Left   . HERNIA REPAIR Right   . I & D EXTREMITY Left 05/12/2020   Procedure: DEBRIDEMENT LEFT ANKLE & REMOVAL DEEP HARDWARE;  Surgeon: Nadara Mustard, MD;  Location[facility] OR;  Service: Orthopedics;  Laterality: Left;  . I & D EXTREMITY Left 05/21/2020   Procedure: REPEAT IRRIGATION AND DEBRIDEMENT LEFT ANKLE;  Surgeon: Nadara Mustard, MD;  Location: [facility] OR;  Service: Orthopedics;  Laterality: Left;  . KNEE SURGERY Right    cartilage   Social History   Occupational History  . Not on file  Tobacco Use  . Smoking status: Current Some Day Smoker    Types: Cigars  . Smokeless tobacco: Never Used  . Tobacco comment: random  Vaping Use  . Vaping Use: Never used  Substance and Sexual Activity  . Alcohol use: Yes    Alcohol/week: 9.0 standard drinks    Types: 4 Glasses of wine, 5 Cans of beer per week    Comment: 05/11/20- none at times  . Drug use: No  . Sexual activity: Not on file

## 2020-07-05 ENCOUNTER — Encounter: Payer: Self-pay | Admitting: Orthopedic Surgery

## 2020-07-05 ENCOUNTER — Ambulatory Visit (INDEPENDENT_AMBULATORY_CARE_PROVIDER_SITE_OTHER): Payer: Self-pay | Admitting: Physician Assistant

## 2020-07-05 VITALS — Ht 72.0 in | Wt 210.0 lb

## 2020-07-05 DIAGNOSIS — M71072 Abscess of bursa, left ankle and foot: Secondary | ICD-10-CM

## 2020-07-05 NOTE — Progress Notes (Signed)
Office Visit Note   Patient: Travis Mayo           Date of Birth: 1960/12/10           MRN: [id number] Visit Date: 07/05/2020              Requested by: Felix Pacini, FNP [address] [place],  [place] [address] PCP: Felix Pacini, FNP  Chief Complaint  Patient presents with  . Left Ankle - Routine Post Op    05/21/20 I&D left ankle       HPI: Patient presents today follow-up for his wrapping he is 6 weeks status post irrigation and debridement of left ankle.  We have been doing Prisma and Profore dressing changes he did have a little burning after the last dressing change  Assessment & Plan: Visit Diagnoses: No diagnosis found.  Plan: Patient was seen today by Dr. Lajoyce Corners will continue twice weekly.  Follow-up on Thursday  Follow-Up Instructions: No follow-ups on file.   Ortho Exam  Patient is alert, oriented, no adenopathy, well-dressed, normal affect, normal respiratory effort. Patient looks well swelling continues to decrease he does have some wrinkling.  Wound has brisk bleeding on the distal end with good vascular fibrinous tissue just a small amount 20% of central portion is still fibrinous tissue.  No surrounding cellulitis or foul odor.  No signs of infection.  Imaging: No results found. No images are attached to the encounter.  Labs: Lab Results  Component Value Date   ESRSEDRATE 9 06/15/2020   ESRSEDRATE 106 (H) 05/20/2020   ESRSEDRATE 29 (H) 03/10/2020   CRP 2.8 06/15/2020   CRP 6.8 (H) 05/20/2020   CRP 1.2 (H) 03/10/2020   REPTSTATUS 05/18/2020 FINAL 05/12/2020   GRAMSTAIN NO WBC SEEN RARE GRAM POSITIVE COCCI IN PAIRS  05/12/2020   CULT  05/12/2020    MODERATE STAPHYLOCOCCUS AUREUS NO ANAEROBES ISOLATED Performed at [facility] Lab, [address], [place], [place] [address]    [facility] STAPHYLOCOCCUS AUREUS 05/12/2020     Lab Results  Component Value Date   ALBUMIN 3.1 (L) 05/12/2020   ALBUMIN 3.8 03/10/2020   ALBUMIN 4.1  10/07/2018    No results found for: MG No results found for: VD25OH  No results found for: PREALBUMIN CBC EXTENDED Latest Ref Rng & Units 05/18/2020 05/12/2020 03/26/2020  WBC 4.0 - 10.5 K/uL 7.4 15.6(H) 10.4  RBC 4.22 - 5.81 MIL/uL 3.47(L) 4.41 4.20(L)  HGB 13.0 - 17.0 g/dL 9.3(Y) 12.7(L) 12.7(L)  HCT 39 - 52 % 30.0(L) 40.3 39.1  PLT 150 - 400 K/uL 584(H) 469(H) 458(H)  NEUTROABS 1.7 - 7.7 K/uL 5.5 - -  LYMPHSABS 0.7 - 4.0 K/uL 0.9 - -     Body mass index is 28.48 kg/m.  Orders:  No orders of the defined types were placed in this encounter.  No orders of the defined types were placed in this encounter.    Procedures: No procedures performed  Clinical Data: No additional findings.  ROS:  All other systems negative, except as noted in the HPI. Review of Systems  Objective: Vital Signs: Ht 6' (1.829 m)   Wt 210 lb (95.3 kg)   BMI 28.48 kg/m   Specialty Comments:  No specialty comments available.  PMFS History: Patient Active Problem List   Diagnosis Date Noted  . MSSA (methicillin susceptible Staphylococcus aureus) infection 05/18/2020  . Cardiac murmur due to mitral valve disorder 05/18/2020  . Abscess of bursa of left ankle 05/12/2020  .  Hardware complicating wound infection (HCC)   . Left ankle pain 03/26/2020  . Charcot's joint, left ankle and foot    Past Medical History:  Diagnosis Date  . Arthritis   . Cataract   . Charcot's arthropathy    left  . Hypertension     No family history on file.  Past Surgical History:  Procedure Laterality Date  . ANKLE FUSION Left 03/26/2020   Procedure: LEFT TIBIOCALCANEAL FUSION;  Surgeon: Nadara Mustard, MD;  Location: [facility] OR;  Service: Orthopedics;  Laterality: Left;  . CATARACT EXTRACTION Left   . HERNIA REPAIR Right   . I & D EXTREMITY Left 05/12/2020   Procedure: DEBRIDEMENT LEFT ANKLE & REMOVAL DEEP HARDWARE;  Surgeon: Nadara Mustard, MD;  [facility]: [facility] OR;  Service: Orthopedics;  Laterality: Left;  . I & D  EXTREMITY Left 05/21/2020   Procedure: REPEAT IRRIGATION AND DEBRIDEMENT LEFT ANKLE;  Surgeon: Nadara Mustard, MD;  Location: [facility] OR;  Service: Orthopedics;  Laterality: Left;  . KNEE SURGERY Right    cartilage   Social History   Occupational History  . Not on file  Tobacco Use  . Smoking status: Current Some Day Smoker    Types: Cigars  . Smokeless tobacco: Never Used  . Tobacco comment: random  Vaping Use  . Vaping Use: Never used  Substance and Sexual Activity  . Alcohol use: Yes    Alcohol/week: 9.0 standard drinks    Types: 4 Glasses of wine, 5 Cans of beer per week    Comment: 05/11/20- none at times  . Drug use: No  . Sexual activity: Not on file

## 2020-07-08 ENCOUNTER — Ambulatory Visit (INDEPENDENT_AMBULATORY_CARE_PROVIDER_SITE_OTHER): Payer: Self-pay | Admitting: Physician Assistant

## 2020-07-08 ENCOUNTER — Encounter: Payer: Self-pay | Admitting: Orthopedic Surgery

## 2020-07-08 VITALS — Ht 72.0 in | Wt 210.0 lb

## 2020-07-08 DIAGNOSIS — M14672 Charcot's joint, left ankle and foot: Secondary | ICD-10-CM

## 2020-07-08 NOTE — Progress Notes (Signed)
Office Visit Note   Patient: Travis Mayo           Date of Birth: 1961-08-29           MRN: [id number] Visit Date: 07/08/2020              Requested by: Felix Pacini, FNP [address] [place],  [place] [address] PCP: Felix Pacini, FNP  Chief Complaint  Patient presents with  . Left Ankle - Routine Post Op    05/21/20 left ankle I&D       HPI: This is a pleasant 59 year old gentleman who is status post left ankle irrigation and debridement.  He has been coming in for twice weekly Profore dressings.  He is tolerating this well.  He was seen by Dr. Lajoyce Corners on Monday who recommended at least 1 more week of wrapping  Assessment & Plan: Visit Diagnoses: No diagnosis found.  Plan: Prisma and Profore dressing applied will follow up on Monday with Dr. Lajoyce Corners hopefully can go into a compression sock soon patient was seen today by Dr. Lajoyce Corners.  When patient returns on Monday we should get 3 views of his ankle radiographs  Follow-Up Instructions: No follow-ups on file.   Ortho Exam  Patient is alert, oriented, no adenopathy, well-dressed, normal affect, normal respiratory effort. Continues to have excellent for venous vascular granulation tissue.  Only one central portion still fibrinous tissue probably only overall 10 to 15% of the wound no surrounding cellulitis wrinkling of the skin skin is in good condition  Imaging: No results found. No images are attached to the encounter.  Labs: Lab Results  Component Value Date   ESRSEDRATE 9 06/15/2020   ESRSEDRATE 106 (H) 05/20/2020   ESRSEDRATE 29 (H) 03/10/2020   CRP 2.8 06/15/2020   CRP 6.8 (H) 05/20/2020   CRP 1.2 (H) 03/10/2020   REPTSTATUS 05/18/2020 FINAL 05/12/2020   GRAMSTAIN NO WBC SEEN RARE GRAM POSITIVE COCCI IN PAIRS  05/12/2020   CULT  05/12/2020    MODERATE STAPHYLOCOCCUS AUREUS NO ANAEROBES ISOLATED Performed at [facility] Lab, [address], [place], [place] [address]    [facility] STAPHYLOCOCCUS  AUREUS 05/12/2020     Lab Results  Component Value Date   ALBUMIN 3.1 (L) 05/12/2020   ALBUMIN 3.8 03/10/2020   ALBUMIN 4.1 10/07/2018    No results found for: MG No results found for: VD25OH  No results found for: PREALBUMIN CBC EXTENDED Latest Ref Rng & Units 05/18/2020 05/12/2020 03/26/2020  WBC 4.0 - 10.5 K/uL 7.4 15.6(H) 10.4  RBC 4.22 - 5.81 MIL/uL 3.47(L) 4.41 4.20(L)  HGB 13.0 - 17.0 g/dL 0.6(T) 12.7(L) 12.7(L)  HCT 39 - 52 % 30.0(L) 40.3 39.1  PLT 150 - 400 K/uL 584(H) 469(H) 458(H)  NEUTROABS 1.7 - 7.7 K/uL 5.5 - -  LYMPHSABS 0.7 - 4.0 K/uL 0.9 - -     Body mass index is 28.48 kg/m.  Orders:  No orders of the defined types were placed in this encounter.  No orders of the defined types were placed in this encounter.    Procedures: No procedures performed  Clinical Data: No additional findings.  ROS:  All other systems negative, except as noted in the HPI. Review of Systems  Objective: Vital Signs: Ht 6' (1.829 m)   Wt 210 lb (95.3 kg)   BMI 28.48 kg/m   Specialty Comments:  No specialty comments available.  PMFS History: Patient Active Problem List   Diagnosis Date Noted  . MSSA (  methicillin susceptible Staphylococcus aureus) infection 05/18/2020  . Cardiac murmur due to mitral valve disorder 05/18/2020  . Abscess of bursa of left ankle 05/12/2020  . Hardware complicating wound infection (HCC)   . Left ankle pain 03/26/2020  . Charcot's joint, left ankle and foot    Past Medical History:  Diagnosis Date  . Arthritis   . Cataract   . Charcot's arthropathy    left  . Hypertension     No family history on file.  Past Surgical History:  Procedure Laterality Date  . ANKLE FUSION Left 03/26/2020   Procedure: LEFT TIBIOCALCANEAL FUSION;  Surgeon: Nadara Mustard, MD;  Location: [facility] OR;  Service: Orthopedics;  Laterality: Left;  . CATARACT EXTRACTION Left   . HERNIA REPAIR Right   . I & D EXTREMITY Left 05/12/2020   Procedure: DEBRIDEMENT  LEFT ANKLE & REMOVAL DEEP HARDWARE;  Surgeon: Nadara Mustard, MD;  Location: [facility] OR;  Service: Orthopedics;  Laterality: Left;  . I & D EXTREMITY Left 05/21/2020   Procedure: REPEAT IRRIGATION AND DEBRIDEMENT LEFT ANKLE;  Surgeon: Nadara Mustard, MD;  Location: [facility] OR;  Service: Orthopedics;  Laterality: Left;  . KNEE SURGERY Right    cartilage   Social History   Occupational History  . Not on file  Tobacco Use  . Smoking status: Current Some Day Smoker    Types: Cigars  . Smokeless tobacco: Never Used  . Tobacco comment: random  Vaping Use  . Vaping Use: Never used  Substance and Sexual Activity  . Alcohol use: Yes    Alcohol/week: 9.0 standard drinks    Types: 4 Glasses of wine, 5 Cans of beer per week    Comment: 05/11/20- none at times  . Drug use: No  . Sexual activity: Not on file

## 2020-07-09 ENCOUNTER — Other Ambulatory Visit: Payer: Self-pay | Admitting: *Deleted

## 2020-07-09 DIAGNOSIS — I10 Essential (primary) hypertension: Secondary | ICD-10-CM

## 2020-07-09 MED ORDER — HYDROCHLOROTHIAZIDE 25 MG PO TABS: 25.0000 mg | ORAL_TABLET | Freq: Every day | ORAL | 0 refills | Status: DC

## 2020-07-12 ENCOUNTER — Ambulatory Visit (INDEPENDENT_AMBULATORY_CARE_PROVIDER_SITE_OTHER): Payer: Self-pay | Admitting: Orthopedic Surgery

## 2020-07-12 ENCOUNTER — Ambulatory Visit: Payer: Self-pay

## 2020-07-12 ENCOUNTER — Encounter: Payer: Self-pay | Admitting: Orthopedic Surgery

## 2020-07-12 VITALS — Ht 72.0 in | Wt 210.0 lb

## 2020-07-12 DIAGNOSIS — M14672 Charcot's joint, left ankle and foot: Secondary | ICD-10-CM

## 2020-07-12 DIAGNOSIS — Z945 Skin transplant status: Secondary | ICD-10-CM

## 2020-07-12 NOTE — Progress Notes (Signed)
Office Visit Note   Patient: Travis Mayo           Date of Birth: 1961-09-01           MRN: [id number] Visit Date: 07/12/2020              Requested by: Travis Pacini, FNP [address] [place],  [place] [address] PCP: Travis Pacini, FNP  Chief Complaint  Patient presents with  . Left Ankle - Follow-up    05/21/2020 Left ankle I&D      HPI: Patient is a 59 year old gentleman who presents in follow-up status post removal of infected deep retained hardware and split-thickness skin graft.  Patient has been undergoing twice a week compression wraps he states he is doing well has had significant decrease swelling and excellent improvement in the wound healing.  Assessment & Plan: Visit Diagnoses:  1. Charcot's joint, left ankle and foot   2. H/O skin graft     Plan: Patient was given a double extra-large compression sock he states he does have some extra-large socks at home he will change these daily continue with the fracture boot continue nonweightbearing at follow-up we will begin weightbearing.  Follow-Up Instructions: Return in about 2 weeks (around 07/26/2020).   Ortho Exam  Patient is alert, oriented, no adenopathy, well-dressed, normal affect, normal respiratory effort. Examination patient has had excellent improvement in the wound bed there is significant decrease swelling with good wrinkling of the skin there is approximately 75% healthy granulation tissue in the wound bed there is no drainage no odor no cellulitis no signs of infection his calf measures 34 cm in circumference.  A knee-high compression sock was applied.  Imaging: No results found.   Labs: Lab Results  Component Value Date   ESRSEDRATE 9 06/15/2020   ESRSEDRATE 106 (H) 05/20/2020   ESRSEDRATE 29 (H) 03/10/2020   CRP 2.8 06/15/2020   CRP 6.8 (H) 05/20/2020   CRP 1.2 (H) 03/10/2020   REPTSTATUS 05/18/2020 FINAL 05/12/2020   GRAMSTAIN NO WBC SEEN RARE GRAM POSITIVE COCCI IN PAIRS   05/12/2020   CULT  05/12/2020    MODERATE STAPHYLOCOCCUS AUREUS NO ANAEROBES ISOLATED Performed at [facility] Lab, [address], [place], [place] [address]    [facility] STAPHYLOCOCCUS AUREUS 05/12/2020     Lab Results  Component Value Date   ALBUMIN 3.1 (L) 05/12/2020   ALBUMIN 3.8 03/10/2020   ALBUMIN 4.1 10/07/2018    No results found for: MG No results found for: VD25OH  No results found for: PREALBUMIN CBC EXTENDED Latest Ref Rng & Units 05/18/2020 05/12/2020 03/26/2020  WBC 4.0 - 10.5 K/uL 7.4 15.6(H) 10.4  RBC 4.22 - 5.81 MIL/uL 3.47(L) 4.41 4.20(L)  HGB 13.0 - 17.0 g/dL 8.2(N) 12.7(L) 12.7(L)  HCT 39 - 52 % 30.0(L) 40.3 39.1  PLT 150 - 400 K/uL 584(H) 469(H) 458(H)  NEUTROABS 1.7 - 7.7 K/uL 5.5 - -  LYMPHSABS 0.7 - 4.0 K/uL 0.9 - -     Body mass index is 28.48 kg/m.  Orders:  Orders Placed This Encounter  Procedures  . XR Ankle Complete Left   No orders of the defined types were placed in this encounter.    Procedures: No procedures performed  Clinical Data: No additional findings.  ROS:  All other systems negative, except as noted in the HPI. Review of Systems  Objective: Vital Signs: Ht 6' (1.829 m)   Wt 210 lb (95.3 kg)   BMI 28.48 kg/m  Specialty Comments:  No specialty comments available.  PMFS History: Patient Active Problem List   Diagnosis Date Noted  . MSSA (methicillin susceptible Staphylococcus aureus) infection 05/18/2020  . Cardiac murmur due to mitral valve disorder 05/18/2020  . Abscess of bursa of left ankle 05/12/2020  . Hardware complicating wound infection (HCC)   . Left ankle pain 03/26/2020  . Charcot's joint, left ankle and foot    Past Medical History:  Diagnosis Date  . Arthritis   . Cataract   . Charcot's arthropathy    left  . Hypertension     History reviewed. No pertinent family history.  Past Surgical History:  Procedure Laterality Date  . ANKLE FUSION Left 03/26/2020   Procedure: LEFT  TIBIOCALCANEAL FUSION;  Surgeon: Travis Mustard, MD;  Location: [facility] OR;  Service: Orthopedics;  Laterality: Left;  . CATARACT EXTRACTION Left   . HERNIA REPAIR Right   . I & D EXTREMITY Left 05/12/2020   Procedure: DEBRIDEMENT LEFT ANKLE & REMOVAL DEEP HARDWARE;  Surgeon: Travis Mustard, MD;  Location: [facility] OR;  Service: Orthopedics;  Laterality: Left;  . I & D EXTREMITY Left 05/21/2020   Procedure: REPEAT IRRIGATION AND DEBRIDEMENT LEFT ANKLE;  Surgeon: Travis Mustard, MD;  Location: [facility] OR;  Service: Orthopedics;  Laterality: Left;  . KNEE SURGERY Right    cartilage   Social History   Occupational History  . Not on file  Tobacco Use  . Smoking status: Current Some Day Smoker    Types: Cigars  . Smokeless tobacco: Never Used  . Tobacco comment: random  Vaping Use  . Vaping Use: Never used  Substance and Sexual Activity  . Alcohol use: Yes    Alcohol/week: 9.0 standard drinks    Types: 4 Glasses of wine, 5 Cans of beer per week    Comment: 05/11/20- none at times  . Drug use: No  . Sexual activity: Not on file

## 2020-07-13 ENCOUNTER — Ambulatory Visit: Payer: Self-pay | Admitting: Orthopedic Surgery

## 2020-07-14 ENCOUNTER — Encounter: Payer: Self-pay | Admitting: Orthopedic Surgery

## 2020-07-22 ENCOUNTER — Encounter: Payer: Self-pay | Admitting: Orthopedic Surgery

## 2020-07-22 ENCOUNTER — Ambulatory Visit (INDEPENDENT_AMBULATORY_CARE_PROVIDER_SITE_OTHER): Payer: Self-pay | Admitting: Orthopedic Surgery

## 2020-07-22 DIAGNOSIS — Z945 Skin transplant status: Secondary | ICD-10-CM

## 2020-07-22 NOTE — Progress Notes (Signed)
Office Visit Note   Patient: Travis Mayo           Date of Birth: 12-20-1960           MRN: [id number] Visit Date: 07/22/2020              Requested by: Felix Pacini, FNP [address] [place],  [place] [address] PCP: Felix Pacini, FNP  No chief complaint on file.     HPI: Patient is a 59 year old gentleman who is seen in follow-up for skin graft left ankle.  Patient states he has been having some increased swelling he is currently wearing an extra-large 15 to 20 mm compression stocking.  Assessment & Plan: Visit Diagnoses:  1. H/O skin graft     Plan: Patient was placed on a size large 20 to 30 mm compression stocking he was given a prescription to obtain these socks he will wear this around-the-clock follow-up in 1 week.  Follow-Up Instructions: Return in about 1 week (around 07/29/2020).   Ortho Exam  Patient is alert, oriented, no adenopathy, well-dressed, normal affect, normal respiratory effort. Patient continues to show excellent healing of the skin graft area with essentially 90% healthy granulation tissue there is a small amount of nonviable tissue that was debrided.  Patient has had increased swelling and his calf currently measures 36 cm in circumference this is consistent with a size large sock he is currently wearing an extra-large 15 to 20 mm compression sock.  He was placed in a size large 20-30 compression he tolerated this well he will wear this around-the-clock.  Imaging: No results found. No images are attached to the encounter.  Labs: Lab Results  Component Value Date   ESRSEDRATE 9 06/15/2020   ESRSEDRATE 106 (H) 05/20/2020   ESRSEDRATE 29 (H) 03/10/2020   CRP 2.8 06/15/2020   CRP 6.8 (H) 05/20/2020   CRP 1.2 (H) 03/10/2020   REPTSTATUS 05/18/2020 FINAL 05/12/2020   GRAMSTAIN NO WBC SEEN RARE GRAM POSITIVE COCCI IN PAIRS  05/12/2020   CULT  05/12/2020    MODERATE STAPHYLOCOCCUS AUREUS NO ANAEROBES ISOLATED Performed at [facility], [address], [place], [place] [address]    [facility] STAPHYLOCOCCUS AUREUS 05/12/2020     Lab Results  Component Value Date   ALBUMIN 3.1 (L) 05/12/2020   ALBUMIN 3.8 03/10/2020   ALBUMIN 4.1 10/07/2018    No results found for: MG No results found for: VD25OH  No results found for: PREALBUMIN CBC EXTENDED Latest Ref Rng & Units 05/18/2020 05/12/2020 03/26/2020  WBC 4.0 - 10.5 K/uL 7.4 15.6(H) 10.4  RBC 4.22 - 5.81 MIL/uL 3.47(L) 4.41 4.20(L)  HGB 13.0 - 17.0 g/dL 9.1(Y) 12.7(L) 12.7(L)  HCT 39 - 52 % 30.0(L) 40.3 39.1  PLT 150 - 400 K/uL 584(H) 469(H) 458(H)  NEUTROABS 1.7 - 7.7 K/uL 5.5 - -  LYMPHSABS 0.7 - 4.0 K/uL 0.9 - -     There is no height or weight on file to calculate BMI.  Orders:  No orders of the defined types were placed in this encounter.  No orders of the defined types were placed in this encounter.    Procedures: No procedures performed  Clinical Data: No additional findings.  ROS:  All other systems negative, except as noted in the HPI. Review of Systems  Objective: Vital Signs: There were no vitals taken for this visit.  Specialty Comments:  No specialty comments available.  PMFS History: Patient Active Problem List   Diagnosis  Date Noted  . MSSA (methicillin susceptible Staphylococcus aureus) infection 05/18/2020  . Cardiac murmur due to mitral valve disorder 05/18/2020  . Abscess of bursa of left ankle 05/12/2020  . Hardware complicating wound infection (HCC)   . Left ankle pain 03/26/2020  . Charcot's joint, left ankle and foot    Past Medical History:  Diagnosis Date  . Arthritis   . Cataract   . Charcot's arthropathy    left  . Hypertension     History reviewed. No pertinent family history.  Past Surgical History:  Procedure Laterality Date  . ANKLE FUSION Left 03/26/2020   Procedure: LEFT TIBIOCALCANEAL FUSION;  Surgeon: Nadara Mustard, MD;  Location: [facility] OR;  Service: Orthopedics;  Laterality: Left;  .  CATARACT EXTRACTION Left   . HERNIA REPAIR Right   . I & D EXTREMITY Left 05/12/2020   Procedure: DEBRIDEMENT LEFT ANKLE & REMOVAL DEEP HARDWARE;  Surgeon: Nadara Mustard, MD;  Location: [facility] OR;  Service: Orthopedics;  Laterality: Left;  . I & D EXTREMITY Left 05/21/2020   Procedure: REPEAT IRRIGATION AND DEBRIDEMENT LEFT ANKLE;  Surgeon: Nadara Mustard, MD;  Location: [facility] OR;  Service: Orthopedics;  Laterality: Left;  . KNEE SURGERY Right    cartilage   Social History   Occupational History  . Not on file  Tobacco Use  . Smoking status: Current Some Day Smoker    Types: Cigars  . Smokeless tobacco: Never Used  . Tobacco comment: random  Vaping Use  . Vaping Use: Never used  Substance and Sexual Activity  . Alcohol use: Yes    Alcohol/week: 9.0 standard drinks    Types: 4 Glasses of wine, 5 Cans of beer per week    Comment: 05/11/20- none at times  . Drug use: No  . Sexual activity: Not on file

## 2020-07-23 ENCOUNTER — Other Ambulatory Visit: Payer: Self-pay | Admitting: Physician Assistant

## 2020-07-29 ENCOUNTER — Encounter: Payer: Self-pay | Admitting: Orthopedic Surgery

## 2020-07-29 ENCOUNTER — Ambulatory Visit (INDEPENDENT_AMBULATORY_CARE_PROVIDER_SITE_OTHER): Payer: Self-pay | Admitting: Orthopedic Surgery

## 2020-07-29 VITALS — Ht 72.0 in | Wt 210.0 lb

## 2020-07-29 DIAGNOSIS — Z945 Skin transplant status: Secondary | ICD-10-CM

## 2020-07-29 NOTE — Progress Notes (Signed)
Office Visit Note   Patient: Travis Mayo           Date of Birth: [birth year]           MRN: [id number] Visit Date: 07/29/2020              Requested by: Felix Pacini, FNP [address] [place],  [place] [address] PCP: Felix Pacini, FNP  Chief Complaint  Patient presents with  . Left Ankle - Routine Post Op      HPI: Patient is a 59 year old gentleman status post left ankle debridement hardware removal and skin graft.  Patient has been wearing compression socks but he states he still has swelling.  Assessment & Plan: Visit Diagnoses:  1. H/O skin graft     Plan: We will apply Prisma and a Dynaflex compression wrap follow-up on Tuesday.  Discussed that if were not showing any improvement we would proceed with skin graft with annular 3 layer placental graft or kerasys skin graft.  Follow-Up Instructions: Return in about 1 week (around 08/05/2020).   Ortho Exam  Patient is alert, oriented, no adenopathy, well-dressed, normal affect, normal respiratory effort. Examination the wound bed has healthy granulation tissue there is 1 area of unincorporated skin graft that is about 5 mm in diameter.  There is some swelling but the wound bed is healthy but there has been no improvement over the last several visits.  There is no cellulitis no odor no drainage.  Imaging: No results found. No images are attached to the encounter.  Labs: Lab Results  Component Value Date   ESRSEDRATE 9 06/15/2020   ESRSEDRATE 106 (H) 05/20/2020   ESRSEDRATE 29 (H) 03/10/2020   CRP 2.8 06/15/2020   CRP 6.8 (H) 05/20/2020   CRP 1.2 (H) 03/10/2020   REPTSTATUS 05/18/2020 FINAL 05/12/2020   GRAMSTAIN NO WBC SEEN RARE GRAM POSITIVE COCCI IN PAIRS  05/12/2020   CULT  05/12/2020    MODERATE STAPHYLOCOCCUS AUREUS NO ANAEROBES ISOLATED Performed at [facility] Lab, [address], [place], [place] [address]    [facility] STAPHYLOCOCCUS AUREUS 05/12/2020     Lab Results  Component  Value Date   ALBUMIN 3.1 (L) 05/12/2020   ALBUMIN 3.8 03/10/2020   ALBUMIN 4.1 10/07/2018    No results found for: MG No results found for: VD25OH  No results found for: PREALBUMIN CBC EXTENDED Latest Ref Rng & Units 05/18/2020 05/12/2020 03/26/2020  WBC 4.0 - 10.5 K/uL 7.4 15.6(H) 10.4  RBC 4.22 - 5.81 MIL/uL 3.47(L) 4.41 4.20(L)  HGB 13.0 - 17.0 g/dL 8.1(E) 12.7(L) 12.7(L)  HCT 39 - 52 % 30.0(L) 40.3 39.1  PLT 150 - 400 K/uL 584(H) 469(H) 458(H)  NEUTROABS 1.7 - 7.7 K/uL 5.5 - -  LYMPHSABS 0.7 - 4.0 K/uL 0.9 - -     Body mass index is 28.48 kg/m.  Orders:  No orders of the defined types were placed in this encounter.  No orders of the defined types were placed in this encounter.    Procedures: No procedures performed  Clinical Data: No additional findings.  ROS:  All other systems negative, except as noted in the HPI. Review of Systems  Objective: Vital Signs: Ht 6' (1.829 m)   Wt 210 lb (95.3 kg)   BMI 28.48 kg/m   Specialty Comments:  No specialty comments available.  PMFS History: Patient Active Problem List   Diagnosis Date Noted  . MSSA (methicillin susceptible Staphylococcus aureus) infection 05/18/2020  . Cardiac murmur due  to mitral valve disorder 05/18/2020  . Abscess of bursa of left ankle 05/12/2020  . Hardware complicating wound infection (HCC)   . Left ankle pain 03/26/2020  . Charcot's joint, left ankle and foot    Past Medical History:  Diagnosis Date  . Arthritis   . Cataract   . Charcot's arthropathy    left  . Hypertension     History reviewed. No pertinent family history.  Past Surgical History:  Procedure Laterality Date  . ANKLE FUSION Left 03/26/2020   Procedure: LEFT TIBIOCALCANEAL FUSION;  Surgeon: Nadara Mustard, MD;  Location: [facility] OR;  Service: Orthopedics;  Laterality: Left;  . CATARACT EXTRACTION Left   . HERNIA REPAIR Right   . I & D EXTREMITY Left 05/12/2020   Procedure: DEBRIDEMENT LEFT ANKLE & REMOVAL DEEP  HARDWARE;  Surgeon: Nadara Mustard, MD;  Location: [facility] OR;  Service: Orthopedics;  Laterality: Left;  . I & D EXTREMITY Left 05/21/2020   Procedure: REPEAT IRRIGATION AND DEBRIDEMENT LEFT ANKLE;  Surgeon: Nadara Mustard, MD;  Location: [facility] OR;  Service: Orthopedics;  Laterality: Left;  . KNEE SURGERY Right    cartilage   Social History   Occupational History  . Not on file  Tobacco Use  . Smoking status: Current Some Day Smoker    Types: Cigars  . Smokeless tobacco: Never Used  . Tobacco comment: random  Vaping Use  . Vaping Use: Never used  Substance and Sexual Activity  . Alcohol use: Yes    Alcohol/week: 9.0 standard drinks    Types: 4 Glasses of wine, 5 Cans of beer per week    Comment: 05/11/20- none at times  . Drug use: No  . Sexual activity: Not on file

## 2020-08-03 ENCOUNTER — Other Ambulatory Visit: Payer: Self-pay | Admitting: Physician Assistant

## 2020-08-03 ENCOUNTER — Other Ambulatory Visit (HOSPITAL_COMMUNITY)
Admission: RE | Admit: 2020-08-03 | Discharge: 2020-08-03 | Disposition: A | Discharge status: Home / Self Care | Payer: HRSA Program | Source: Ambulatory Visit | Attending: Orthopedic Surgery | Admitting: Orthopedic Surgery

## 2020-08-03 ENCOUNTER — Other Ambulatory Visit: Payer: Self-pay

## 2020-08-03 ENCOUNTER — Encounter: Payer: Self-pay | Admitting: Physician Assistant

## 2020-08-03 ENCOUNTER — Encounter (HOSPITAL_COMMUNITY): Payer: Self-pay | Admitting: Orthopedic Surgery

## 2020-08-03 ENCOUNTER — Ambulatory Visit (INDEPENDENT_AMBULATORY_CARE_PROVIDER_SITE_OTHER): Payer: Self-pay | Admitting: Orthopedic Surgery

## 2020-08-03 VITALS — Ht 72.0 in | Wt 210.0 lb

## 2020-08-03 DIAGNOSIS — Z945 Skin transplant status: Secondary | ICD-10-CM

## 2020-08-03 DIAGNOSIS — Z01812 Encounter for preprocedural laboratory examination: Secondary | ICD-10-CM | POA: Diagnosis present

## 2020-08-03 DIAGNOSIS — Z20822 Contact with and (suspected) exposure to covid-19: Secondary | ICD-10-CM | POA: Diagnosis not present

## 2020-08-03 LAB — SARS CORONAVIRUS 2 (TAT 6-24 HRS): SARS Coronavirus 2: NEGATIVE

## 2020-08-03 NOTE — Progress Notes (Signed)
Office Visit Note   Patient: Travis Mayo           Date of Birth: 04-Sep-1961           MRN: [id number] Visit Date: 08/03/2020              Requested by: Felix Pacini, FNP [address] [place],  [place] [address] PCP: Felix Pacini, FNP  Chief Complaint  Patient presents with  . Left Ankle - Routine Post Op    05/21/20 repeat I&D ankle and STSG       HPI: Patient is a 59-year gentleman who presents in follow-up status post skin graft.  He has a healthy wound bed but the wound has stalled with no recent healing.  Assessment & Plan: Visit Diagnoses:  1. H/O skin graft     Plan: We will plan for wound debridement and split-thickness skin graft tomorrow as an outpatient placement of a cleanse choice wound VAC with follow-up in the office in 1 week to remove the VAC.  Follow-Up Instructions: Return in about 1 week (around 08/10/2020).   Ortho Exam  Patient is alert, oriented, no adenopathy, well-dressed, normal affect, normal respiratory effort. Examination patient has significant decrease swelling the skin wrinkles well there is no redness no cellulitis.  The wound bed is 90% healthy granulation tissue the wound measures 17 x 3 cm and is 1 mm deep.  Imaging: No results found. No images are attached to the encounter.  Labs: Lab Results  Component Value Date   ESRSEDRATE 9 06/15/2020   ESRSEDRATE 106 (H) 05/20/2020   ESRSEDRATE 29 (H) 03/10/2020   CRP 2.8 06/15/2020   CRP 6.8 (H) 05/20/2020   CRP 1.2 (H) 03/10/2020   REPTSTATUS 05/18/2020 FINAL 05/12/2020   GRAMSTAIN NO WBC SEEN RARE GRAM POSITIVE COCCI IN PAIRS  05/12/2020   CULT  05/12/2020    MODERATE STAPHYLOCOCCUS AUREUS NO ANAEROBES ISOLATED Performed at [facility], [address], [place], [place] [address]    [facility] STAPHYLOCOCCUS AUREUS 05/12/2020     Lab Results  Component Value Date   ALBUMIN 3.1 (L) 05/12/2020   ALBUMIN 3.8 03/10/2020   ALBUMIN 4.1 10/07/2018    No  results found for: MG No results found for: VD25OH  No results found for: PREALBUMIN CBC EXTENDED Latest Ref Rng & Units 05/18/2020 05/12/2020 03/26/2020  WBC 4.0 - 10.5 K/uL 7.4 15.6(H) 10.4  RBC 4.22 - 5.81 MIL/uL 3.47(L) 4.41 4.20(L)  HGB 13.0 - 17.0 g/dL 4.3(P) 12.7(L) 12.7(L)  HCT 39 - 52 % 30.0(L) 40.3 39.1  PLT 150 - 400 K/uL 584(H) 469(H) 458(H)  NEUTROABS 1.7 - 7.7 K/uL 5.5 - -  LYMPHSABS 0.7 - 4.0 K/uL 0.9 - -     Body mass index is 28.48 kg/m.  Orders:  No orders of the defined types were placed in this encounter.  No orders of the defined types were placed in this encounter.    Procedures: No procedures performed  Clinical Data: No additional findings.  ROS:  All other systems negative, except as noted in the HPI. Review of Systems  Objective: Vital Signs: Ht 6' (1.829 m)   Wt 210 lb (95.3 kg)   BMI 28.48 kg/m   Specialty Comments:  No specialty comments available.  PMFS History: Patient Active Problem List   Diagnosis Date Noted  . MSSA (methicillin susceptible Staphylococcus aureus) infection 05/18/2020  . Cardiac murmur due to mitral valve disorder 05/18/2020  . Abscess of bursa of  left ankle 05/12/2020  . Hardware complicating wound infection (HCC)   . Left ankle pain 03/26/2020  . Charcot's joint, left ankle and foot    Past Medical History:  Diagnosis Date  . Arthritis   . Cataract   . Charcot's arthropathy    left  . Hypertension     History reviewed. No pertinent family history.  Past Surgical History:  Procedure Laterality Date  . ANKLE FUSION Left 03/26/2020   Procedure: LEFT TIBIOCALCANEAL FUSION;  Surgeon: Nadara Mustard, MD;  Location[facility] OR;  Service: Orthopedics;  Laterality: Left;  . CATARACT EXTRACTION Left   . HERNIA REPAIR Right   . I & D EXTREMITY Left 05/12/2020   Procedure: DEBRIDEMENT LEFT ANKLE & REMOVAL DEEP HARDWARE;  Surgeon: Nadara Mustard, MD;  Location: [facility] OR;  Service: Orthopedics;  Laterality: Left;  . I & D  EXTREMITY Left 05/21/2020   Procedure: REPEAT IRRIGATION AND DEBRIDEMENT LEFT ANKLE;  Surgeon: Nadara Mustard, MD;  Location: [facility] OR;  Service: Orthopedics;  Laterality: Left;  . KNEE SURGERY Right    cartilage   Social History   Occupational History  . Not on file  Tobacco Use  . Smoking status: Current Some Day Smoker    Types: Cigars  . Smokeless tobacco: Never Used  . Tobacco comment: random  Vaping Use  . Vaping Use: Never used  Substance and Sexual Activity  . Alcohol use: Yes    Alcohol/week: 9.0 standard drinks    Types: 4 Glasses of wine, 5 Cans of beer per week    Comment: 05/11/20- none at times  . Drug use: No  . Sexual activity: Not on file

## 2020-08-03 NOTE — Progress Notes (Signed)
PCP - denies  Cardiologist - denies  Chest x-ray - n/a EKG - 03/26/20 Stress Test - denies ECHO - 05/17/20 Cardiac Cath - denies  Blood Thinner Instructions: na Aspirin Instructions: n/a  ERAS Protcol - yes clears until 1230   COVID TEST- 11/2 pending   Anesthesia review: n/a   -------------  SDW INSTRUCTIONS:  Your procedure is scheduled on Wednesday 08/04/20.  Report to [facility] Main Entrance "A" at 1 P.M., and check in at the Admitting office.  Call this number if you have problems the morning of surgery: [phone]   Remember: Do not eat after midnight the night before your surgery  You may drink clear liquids until 1230 the day of your surgery.   Clear liquids allowed are: Water, Non-Citrus Juices (without pulp), Carbonated Beverages, Clear Tea, Black Coffee Only, and Gatorade   No medications necessary morning of surgery  As of today, STOP taking any Aspirin (unless otherwise instructed by your surgeon), Aleve, Naproxen, Ibuprofen, Motrin, Advil, Goody's, BC's, all herbal medications, fish oil, and all vitamins.    The Morning of Surgery  Do not wear jewelry  Do not wear lotions, powders, or colognes, or deodorant   Men may shave face and neck.  Do not bring valuables to the hospital.  [facility] is not responsible for any belongings or valuables.  If you are a smoker, DO NOT Smoke 24 hours prior to surgery  If you wear a CPAP at night please bring your mask the morning of surgery   Remember that you must have someone to transport you home after your surgery, and remain with you for 24 hours if you are discharged the same day.   Please bring cases for contacts, glasses, hearing aids, dentures or bridgework because it cannot be worn into surgery.    Leave your suitcase in the car.  After surgery it may be brought to your room.  For patients admitted to the hospital, discharge time will be determined by your treatment team.  Patients discharged the  day of surgery will not be allowed to drive home.    Special instructions:   [facility]- Preparing For Surgery  Oral Hygiene is also important to reduce your risk of infection.  Remember - BRUSH YOUR TEETH THE MORNING OF SURGERY WITH YOUR REGULAR TOOTHPASTE  Please follow these instructions carefully.   1. Shower the NIGHT BEFORE SURGERY and the MORNING OF SURGERY with DIAL Soap.   2. Wash thoroughly, paying special attention to the area where your surgery will be performed.  3. Thoroughly rinse your body with warm water from the neck down.  4. Pat yourself dry with a CLEAN TOWEL.  5. Wear CLEAN PAJAMAS to bed the night before surgery  6. Place CLEAN SHEETS on your bed the night of your first shower and DO NOT SLEEP WITH PETS.  7. Wear comfortable clothes the morning of surgery.    Day of Surgery:  Please shower the morning of surgery with the DIAL soap Do not apply any deodorants/lotions. Please wear clean clothes to the hospital/surgery center.   Remember to brush your teeth WITH YOUR REGULAR TOOTHPASTE.   Please read over the following fact sheets that you were given.  Patient denies shortness of breath, fever, cough and chest pain.

## 2020-08-04 ENCOUNTER — Encounter (HOSPITAL_COMMUNITY)
Admission: RE | Disposition: A | Discharge status: Home / Self Care | Payer: Self-pay | Source: Home / Self Care | Attending: Orthopedic Surgery

## 2020-08-04 ENCOUNTER — Other Ambulatory Visit: Payer: Self-pay

## 2020-08-04 ENCOUNTER — Ambulatory Visit (HOSPITAL_COMMUNITY): Payer: Self-pay | Admitting: Anesthesiology

## 2020-08-04 ENCOUNTER — Encounter (HOSPITAL_COMMUNITY): Payer: Self-pay | Admitting: Orthopedic Surgery

## 2020-08-04 ENCOUNTER — Encounter: Payer: Self-pay | Admitting: Orthopedic Surgery

## 2020-08-04 ENCOUNTER — Ambulatory Visit (HOSPITAL_COMMUNITY)
Admission: RE | Admit: 2020-08-04 | Discharge: 2020-08-04 | Disposition: A | Discharge status: Home / Self Care | Payer: Self-pay | Attending: Orthopedic Surgery | Admitting: Orthopedic Surgery

## 2020-08-04 DIAGNOSIS — S91002S Unspecified open wound, left ankle, sequela: Secondary | ICD-10-CM

## 2020-08-04 DIAGNOSIS — T8131XA Disruption of external operation (surgical) wound, not elsewhere classified, initial encounter: Secondary | ICD-10-CM | POA: Insufficient documentation

## 2020-08-04 DIAGNOSIS — Y9389 Activity, other specified: Secondary | ICD-10-CM | POA: Insufficient documentation

## 2020-08-04 DIAGNOSIS — I10 Essential (primary) hypertension: Secondary | ICD-10-CM | POA: Insufficient documentation

## 2020-08-04 DIAGNOSIS — F17200 Nicotine dependence, unspecified, uncomplicated: Secondary | ICD-10-CM | POA: Insufficient documentation

## 2020-08-04 DIAGNOSIS — Z981 Arthrodesis status: Secondary | ICD-10-CM | POA: Insufficient documentation

## 2020-08-04 DIAGNOSIS — X58XXXA Exposure to other specified factors, initial encounter: Secondary | ICD-10-CM | POA: Insufficient documentation

## 2020-08-04 DIAGNOSIS — F17290 Nicotine dependence, other tobacco product, uncomplicated: Secondary | ICD-10-CM | POA: Insufficient documentation

## 2020-08-04 DIAGNOSIS — M1990 Unspecified osteoarthritis, unspecified site: Secondary | ICD-10-CM | POA: Insufficient documentation

## 2020-08-04 HISTORY — PX: SKIN SPLIT GRAFT: SHX444

## 2020-08-04 LAB — COMPREHENSIVE METABOLIC PANEL
ALT: 29 U/L (ref 0–44)
AST: 27 U/L (ref 15–41)
Albumin: 3.8 g/dL (ref 3.5–5.0)
Alkaline Phosphatase: 60 U/L (ref 38–126)
Anion gap: 12 (ref 5–15)
BUN: 14 mg/dL (ref 6–20)
CO2: 23 mmol/L (ref 22–32)
Calcium: 9.1 mg/dL (ref 8.9–10.3)
Chloride: 108 mmol/L (ref 98–111)
Creatinine, Ser: 1.22 mg/dL (ref 0.61–1.24)
GFR, Estimated: >60 mL/min (ref >60)
Glucose, Bld: 83 mg/dL (ref 70–99)
Potassium: 3.7 mmol/L (ref 3.5–5.1)
Sodium: 143 mmol/L (ref 135–145)
Total Bilirubin: 0.6 mg/dL (ref 0.3–1.2)
Total Protein: 6.7 g/dL (ref 6.5–8.1)

## 2020-08-04 LAB — CBC
HCT: 41.7 % (ref 39.0–52.0)
Hemoglobin: 12.6 g/dL — ABNORMAL LOW (ref 13.0–17.0)
MCH: 26.3 pg (ref 26.0–34.0)
MCHC: 30.2 g/dL (ref 30.0–36.0)
MCV: 86.9 fL (ref 80.0–100.0)
Platelets: 374 10*3/uL (ref 150–400)
RBC: 4.8 MIL/uL (ref 4.22–5.81)
RDW: 15.6 % — ABNORMAL HIGH (ref 11.5–15.5)
WBC: 13.8 10*3/uL — ABNORMAL HIGH (ref 4.0–10.5)
nRBC: 0 % (ref 0.0–0.2)

## 2020-08-04 SURGERY — APPLICATION, GRAFT, SKIN, SPLIT-THICKNESS
Anesthesia: General | Laterality: Left

## 2020-08-04 MED ORDER — OXYCODONE HCL 5 MG/5ML PO SOLN: 5.0000 mg | Freq: Once | ORAL | Status: DC | PRN

## 2020-08-04 MED ORDER — PHENYLEPHRINE HCL-NACL 10-0.9 MG/250ML-% IV SOLN
INTRAVENOUS | Status: DC | PRN
  Administered 2020-08-04: 80 ug/min via INTRAVENOUS

## 2020-08-04 MED ORDER — MIDAZOLAM HCL 2 MG/2ML IJ SOLN
INTRAMUSCULAR | Status: DC | PRN
  Administered 2020-08-04: 2 mg via INTRAVENOUS

## 2020-08-04 MED ORDER — FENTANYL CITRATE (PF) 250 MCG/5ML IJ SOLN
INTRAMUSCULAR | Status: AC
  Filled 2020-08-04: qty 5

## 2020-08-04 MED ORDER — HYDROMORPHONE HCL 1 MG/ML IJ SOLN: 0.2500 mg | INTRAMUSCULAR | Status: DC | PRN

## 2020-08-04 MED ORDER — ORAL CARE MOUTH RINSE: 15.0000 mL | Freq: Once | OROMUCOSAL | Status: AC

## 2020-08-04 MED ORDER — MIDAZOLAM HCL 2 MG/2ML IJ SOLN
INTRAMUSCULAR | Status: AC
  Filled 2020-08-04: qty 2

## 2020-08-04 MED ORDER — LACTATED RINGERS IV SOLN: INTRAVENOUS | Status: DC

## 2020-08-04 MED ORDER — PROPOFOL 10 MG/ML IV BOLUS
INTRAVENOUS | Status: DC | PRN
  Administered 2020-08-04: 100 mg via INTRAVENOUS
  Administered 2020-08-04: 200 mg via INTRAVENOUS
  Administered 2020-08-04: 100 mg via INTRAVENOUS

## 2020-08-04 MED ORDER — ONDANSETRON HCL 4 MG/2ML IJ SOLN
INTRAMUSCULAR | Status: DC | PRN
  Administered 2020-08-04: 4 mg via INTRAVENOUS

## 2020-08-04 MED ORDER — CEFAZOLIN SODIUM-DEXTROSE 2-4 GM/100ML-% IV SOLN
2.0000 g | INTRAVENOUS | Status: AC
  Administered 2020-08-04: 2 g via INTRAVENOUS
  Filled 2020-08-04: qty 100

## 2020-08-04 MED ORDER — CHLORHEXIDINE GLUCONATE 0.12 % MT SOLN
OROMUCOSAL | Status: AC
  Administered 2020-08-04: 15 mL via OROMUCOSAL
  Filled 2020-08-04: qty 15

## 2020-08-04 MED ORDER — CHLORHEXIDINE GLUCONATE 0.12 % MT SOLN: 15.0000 mL | Freq: Once | OROMUCOSAL | Status: AC

## 2020-08-04 MED ORDER — DEXAMETHASONE SODIUM PHOSPHATE 10 MG/ML IJ SOLN
INTRAMUSCULAR | Status: DC | PRN
  Administered 2020-08-04: 5 mg via INTRAVENOUS

## 2020-08-04 MED ORDER — OXYCODONE-ACETAMINOPHEN 5-325 MG PO TABS: 1.0000 | ORAL_TABLET | ORAL | 0 refills | Status: AC | PRN

## 2020-08-04 MED ORDER — OXYCODONE HCL 5 MG PO TABS: 5.0000 mg | ORAL_TABLET | Freq: Once | ORAL | Status: DC | PRN

## 2020-08-04 MED ORDER — LIDOCAINE 2% (20 MG/ML) 5 ML SYRINGE
INTRAMUSCULAR | Status: DC | PRN
  Administered 2020-08-04: 100 mg via INTRAVENOUS

## 2020-08-04 MED ORDER — PROMETHAZINE HCL 25 MG/ML IJ SOLN: 6.2500 mg | INTRAMUSCULAR | Status: DC | PRN

## 2020-08-04 MED ORDER — FENTANYL CITRATE (PF) 250 MCG/5ML IJ SOLN
INTRAMUSCULAR | Status: DC | PRN
  Administered 2020-08-04: 50 ug via INTRAVENOUS
  Administered 2020-08-04 (×2): 100 ug via INTRAVENOUS

## 2020-08-04 SURGICAL SUPPLY — 28 items
BNDG COHESIVE 6X5 TAN NS LF (GAUZE/BANDAGES/DRESSINGS) ×3 IMPLANT
COVER SURGICAL LIGHT HANDLE (MISCELLANEOUS) ×3 IMPLANT
DRAPE DERMATAC (DRAPES) ×3 IMPLANT
DRAPE U-SHAPE 47X51 STRL (DRAPES) ×3 IMPLANT
DRESSING VERAFLO CLEANSE CC (GAUZE/BANDAGES/DRESSINGS) ×1 IMPLANT
DRSG VERAFLO CLEANSE CC (GAUZE/BANDAGES/DRESSINGS) ×3
DURAPREP 26ML APPLICATOR (WOUND CARE) ×3 IMPLANT
ELECT REM PT RETURN 9FT ADLT (ELECTROSURGICAL) ×3
ELECTRODE REM PT RTRN 9FT ADLT (ELECTROSURGICAL) ×1 IMPLANT
GLOVE BIOGEL PI IND STRL 9 (GLOVE) ×1 IMPLANT
GLOVE BIOGEL PI INDICATOR 9 (GLOVE) ×2
GLOVE SURG ORTHO 9.0 STRL STRW (GLOVE) ×3 IMPLANT
GOWN STRL REUS W/ TWL XL LVL3 (GOWN DISPOSABLE) ×2 IMPLANT
GOWN STRL REUS W/TWL XL LVL3 (GOWN DISPOSABLE) ×4
GRAFT SKIN WND OMEGA3 3X7 (Tissue) ×2 IMPLANT
GRAFT SKIN WND OMEGA3 7X10 (Tissue) ×1 IMPLANT
GRAFT SKN 7X10XSTRL LF DISP (Tissue) ×1 IMPLANT
KIT BASIN OR (CUSTOM PROCEDURE TRAY) ×3 IMPLANT
KIT DRSG PREVENA PLUS 7DAY 125 (MISCELLANEOUS) ×3 IMPLANT
KIT TURNOVER KIT B (KITS) ×3 IMPLANT
NS IRRIG 1000ML POUR BTL (IV SOLUTION) ×3 IMPLANT
PACK ORTHO EXTREMITY (CUSTOM PROCEDURE TRAY) ×3 IMPLANT
SKIN WOUND KERECIS OMEGA3 3X7 (Tissue) ×1 IMPLANT
SKIN WOUND KERECIS OMEGA3 7X10 (Tissue) ×1 IMPLANT
TOWEL GREEN STERILE (TOWEL DISPOSABLE) ×3 IMPLANT
TOWEL GREEN STERILE FF (TOWEL DISPOSABLE) ×3 IMPLANT
TUBE CONNECTING 12'X1/4 (SUCTIONS)
TUBE CONNECTING 12X1/4 (SUCTIONS) IMPLANT

## 2020-08-04 NOTE — Transfer of Care (Signed)
Immediate Anesthesia Transfer of Care Note  Patient: Carley Strickling  Procedure(s) Performed: Rhett Bannister AND APPLY SPLIT THICKNESS SKIN GRAFT LEFT ANKLE (Left )  Patient Location: PACU  Anesthesia Type:General  Level of Consciousness: awake, alert , oriented and patient cooperative  Airway & Oxygen Therapy: Patient Spontanous Breathing and Patient connected to nasal cannula oxygen  Post-op Assessment: Report given to RN and Post -op Vital signs reviewed and stable  Post vital signs: Reviewed and stable  Last Vitals:  Vitals Value Taken Time  BP 120/80 08/04/20 1503  Temp    Pulse 103 08/04/20 1503  Resp 15 08/04/20 1503  SpO2 94 % 08/04/20 1503  Vitals shown include unvalidated device data.  Last Pain:  Vitals:   08/04/20 1503  TempSrc:   PainSc: (P) 0-No pain         Complications: No complications documented.

## 2020-08-04 NOTE — Anesthesia Procedure Notes (Signed)
Procedure Name: LMA Insertion Date/Time: 08/04/2020 2:24 PM Performed by: Tressia Miners, CRNA Pre-anesthesia Checklist: Patient identified, Emergency Drugs available, Suction available and Patient being monitored Patient Re-evaluated:Patient Re-evaluated prior to induction Oxygen Delivery Method: Circle System Utilized Preoxygenation: Pre-oxygenation with 100% oxygen Induction Type: IV induction Ventilation: Mask ventilation without difficulty LMA: LMA inserted LMA Size: 4.0 Number of attempts: 1 Airway Equipment and Method: Bite block Placement Confirmation: positive ETCO2 Tube secured with: Tape Dental Injury: Teeth and Oropharynx as per pre-operative assessment

## 2020-08-04 NOTE — Anesthesia Preprocedure Evaluation (Signed)
Anesthesia Evaluation  Patient identified by MRN, date of birth, ID band Patient awake    Reviewed: Allergy & Precautions, NPO status , Patient's Chart, lab work & pertinent test results  Airway Mallampati: II  TM Distance: >3 FB Neck ROM: Full    Dental no notable dental hx. (+) Teeth Intact, Dental Advisory Given   Pulmonary Current Smoker and Patient abstained from smoking.,  Cigars socially    Pulmonary exam normal breath sounds clear to auscultation       Cardiovascular hypertension, Pt. on medications Normal cardiovascular exam+ Valvular Problems/Murmurs  Rhythm:Regular Rate:Normal  Uncontrolled HTN   Neuro/Psych negative neurological ROS  negative psych ROS   GI/Hepatic negative GI ROS, (+)     substance abuse  alcohol use,   Endo/Other  negative endocrine ROS  Renal/GU negative Renal ROS  negative genitourinary   Musculoskeletal  (+) Arthritis , Osteoarthritis,  Infected left ankle fusion   Abdominal Normal abdominal exam  (+)   Peds  Hematology negative hematology ROS (+)   Anesthesia Other Findings   Reproductive/Obstetrics negative OB ROS                             Anesthesia Physical  Anesthesia Plan  ASA: II  Anesthesia Plan: General   Post-op Pain Management:    Induction: Intravenous  PONV Risk Score and Plan: 1 and Ondansetron and Treatment may vary due to age or medical condition  Airway Management Planned: LMA  Additional Equipment: None  Intra-op Plan:   Post-operative Plan: Extubation in OR  Informed Consent: I have reviewed the patients History and Physical, chart, labs and discussed the procedure including the risks, benefits and alternatives for the proposed anesthesia with the patient or authorized representative who has indicated his/her understanding and acceptance.     Dental advisory given  Plan Discussed with: CRNA and  Anesthesiologist  Anesthesia Plan Comments:         Anesthesia Quick Evaluation

## 2020-08-04 NOTE — H&P (Signed)
Travis Mayo is an 59 y.o. male.   Chief Complaint: Left Ankle Wound HPI: Patient is a 59-year gentleman who presents in follow-up status post skin graft.  He has a healthy wound bed but the wound has stalled with no recent healing.  Past Medical History:  Diagnosis Date  . Arthritis   . Cataract   . Charcot's arthropathy    left  . Hypertension     Past Surgical History:  Procedure Laterality Date  . ANKLE FUSION Left 03/26/2020   Procedure: LEFT TIBIOCALCANEAL FUSION;  Surgeon: Nadara Mustard, MD;  Location: [facility] OR;  Service: Orthopedics;  Laterality: Left;  . CATARACT EXTRACTION Left   . HERNIA REPAIR Right   . I & D EXTREMITY Left 05/12/2020   Procedure: DEBRIDEMENT LEFT ANKLE & REMOVAL DEEP HARDWARE;  Surgeon: Nadara Mustard, MD;  Location: [facility] OR;  Service: Orthopedics;  Laterality: Left;  . I & D EXTREMITY Left 05/21/2020   Procedure: REPEAT IRRIGATION AND DEBRIDEMENT LEFT ANKLE;  Surgeon: Nadara Mustard, MD;  Location: [facility] OR;  Service: Orthopedics;  Laterality: Left;  . KNEE SURGERY Right    cartilage    History reviewed. No pertinent family history. Social History:  reports that he has been smoking cigars. He has never used smokeless tobacco. He reports current alcohol use of about 20.0 standard drinks of alcohol per week. He reports that he does not use drugs.  Allergies: No Known Allergies  No medications prior to admission.    Results for orders placed or performed during the hospital encounter of 08/03/20 (from the past 48 hour(s))  SARS CORONAVIRUS 2 (TAT 6-24 HRS) Nasopharyngeal Nasopharyngeal Swab     Status: None   Collection Time: 08/03/20  3:08 PM   Specimen: Nasopharyngeal Swab  Result Value Ref Range   SARS Coronavirus 2 NEGATIVE NEGATIVE    Comment: (NOTE) SARS-CoV-2 target nucleic acids are NOT DETECTED.  The SARS-CoV-2 RNA is generally detectable in upper and lower respiratory specimens during the acute phase of infection. Negative results do not  preclude SARS-CoV-2 infection, do not rule out co-infections with other pathogens, and should not be used as the sole basis for treatment or other patient management decisions. Negative results must be combined with clinical observations, patient history, and epidemiological information. The expected result is Negative.  Fact Sheet for Patients: HairSlick.no  Fact Sheet for Healthcare Providers: quierodirigir.com  This test is not yet approved or cleared by the [place] FDA and  has been authorized for detection and/or diagnosis of SARS-CoV-2 by FDA under an Emergency Use Authorization (EUA). This EUA will remain  in effect (meaning this test can be used) for the duration of the COVID-19 declaration under Se ction 564(b)(1) of the Act, 21 U.S.C. section 360bbb-3(b)(1), unless the authorization is terminated or revoked sooner.  Performed at [facility], [address], [place], [place] [address]    No results found.  Review of Systems  All other systems reviewed and are negative.   Height 6' (1.829 m), weight 97.5 kg. Physical Exam  Patient is alert, oriented, no adenopathy, well-dressed, normal affect, normal respiratory effort. Examination patient has significant decrease swelling the skin wrinkles well there is no redness no cellulitis.  The wound bed is 90% healthy granulation tissue the wound measures 17 x 3 cm and is 1 mm deep. Assessment/Plan Plan: We will plan for wound debridement and split-thickness skin graft tomorrow as an outpatient placement of a cleanse choice wound VAC with follow-up  in the office in 1 week to remove the VAC.  [place] Shaquina Gillham, PA 08/04/2020, 12:30 PM

## 2020-08-04 NOTE — Interval H&P Note (Signed)
History and Physical Interval Note:  08/04/2020 2:11 PM  Travis Mayo  has presented today for surgery, with the diagnosis of Dehiscence Left Ankle Incision.  The various methods of treatment have been discussed with the patient and family. After consideration of risks, benefits and other options for treatment, the patient has consented to  Procedure(s): PREPARE AND APPLY SPLIT THICKNESS SKIN GRAFT LEFT ANKLE (Left) as a surgical intervention.  The patient's history has been reviewed, patient examined, no change in status, stable for surgery.  I have reviewed the patient's chart and labs.  Questions were answered to the patient's satisfaction.     Nadara Mustard

## 2020-08-04 NOTE — Op Note (Signed)
08/04/2020  3:16 PM  PATIENT:  Travis Mayo    PRE-OPERATIVE DIAGNOSIS:  Dehiscence Left Ankle Incision  POST-OPERATIVE DIAGNOSIS:  Same  PROCEDURE:  PREPARE wound bed  AND APPLY SPLIT THICKNESS SKIN GRAFT, Kerecis, Cod skin, LEFT ANKLE Apply cleanse choice wound VAC  SURGEON:  Nadara Mustard, MD  PHYSICIAN ASSISTANT:None ANESTHESIA:   General  PREOPERATIVE INDICATIONS:  [name] is a  59 y.o. male with a diagnosis of Dehiscence Left Ankle Incision who failed conservative measures and elected for surgical management.    The risks benefits and alternatives were discussed with the patient preoperatively including but not limited to the risks of infection, bleeding, nerve injury, cardiopulmonary complications, the need for revision surgery, among others, and the patient was willing to proceed.  OPERATIVE IMPLANTS: Kerecis allograft skin graft with a sheet and powder Application of Prevena VAC.  @ENCIMAGES @  OPERATIVE FINDINGS: 100% healthy granulation tissue  OPERATIVE PROCEDURE: Patient was brought the operating room and underwent general anesthetic.  After adequate levels anesthesia were obtained patient's left lower extremity was prepped using Betadine paint and draped into a sterile field a timeout was called.  A bone elevator rondure 10 blade knife were used to debride the wound back to healthy viable granulation tissue there is 100% granulation tissue after the wound bed was prepared.  The kerecis powder was applied to the deep aspect of the wound covered with the skin graft.  A second layer was applied over this this was then covered with the cleanse choice wound VAC sponges x2 cover with derma tack and Covan this had a good suction fit patient was taken the PACU in stable condition   DISCHARGE PLANNING:  Antibiotic duration: Preoperative antibiotics  Weightbearing: Nonweightbearing with a fracture boot  Pain medication: Continue current medication  Dressing care/  Wound VAC: Continue wound VAC  Ambulatory devices: Crutches  Discharge to: Home.  Follow-up: In the office 1 week post operative.

## 2020-08-05 NOTE — Anesthesia Postprocedure Evaluation (Signed)
Anesthesia Post Note  Patient: Travis Mayo  Procedure(s) Performed: PREPARE AND APPLY SPLIT THICKNESS SKIN GRAFT LEFT ANKLE (Left )     Patient location during evaluation: PACU Anesthesia Type: General Level of consciousness: awake Pain management: pain level controlled Vital Signs Assessment: post-procedure vital signs reviewed and stable Respiratory status: spontaneous breathing and respiratory function stable Cardiovascular status: stable Postop Assessment: no apparent nausea or vomiting Anesthetic complications: no   No complications documented.  Last Vitals:  Vitals:   08/04/20 1550 08/04/20 1605  BP: 117/81 127/86  Pulse: 85 89  Resp: 15 17  Temp:  36.6 C  SpO2: 97% 98%    Last Pain:  Vitals:   08/04/20 1550  TempSrc:   PainSc: 0-No pain                 Mellody Dance

## 2020-08-09 ENCOUNTER — Ambulatory Visit (INDEPENDENT_AMBULATORY_CARE_PROVIDER_SITE_OTHER): Payer: Self-pay | Admitting: Orthopedic Surgery

## 2020-08-09 ENCOUNTER — Encounter: Payer: Self-pay | Admitting: Physician Assistant

## 2020-08-09 VITALS — Ht 72.0 in | Wt 214.0 lb

## 2020-08-09 DIAGNOSIS — Z945 Skin transplant status: Secondary | ICD-10-CM

## 2020-08-09 NOTE — Progress Notes (Signed)
Office Visit Note   Patient: Travis Mayo           Date of Birth: Apr 12, 1961           MRN: [id number] Visit Date: 08/09/2020              Requested by: Felix Pacini, FNP [address] [place],  [place] [address] PCP: Felix Pacini, FNP  Chief Complaint  Patient presents with  . Left Ankle - Routine Post Op    08/04/20 STSG left ankle       HPI: Patient is a 59 year old gentleman who presents 1 week status post split-thickness skin graft left ankle with Kerecis split-thickness skin graft.  Assessment & Plan: Visit Diagnoses:  1. H/O skin graft     Plan: Patient will resume Dial soap cleansing Silvadene dressing changes continue with the fracture boot nonweightbearing.  Follow-Up Instructions: Return in about 1 week (around 08/16/2020).   Ortho Exam  Patient is alert, oriented, no adenopathy, well-dressed, normal affect, normal respiratory effort. Examination there has been complete incorporation of the skin graft.  The tissue was 100% healthy viable with granulation tissue.  We will remove the staples today.  Imaging: No results found.   Labs: Lab Results  Component Value Date   ESRSEDRATE 9 06/15/2020   ESRSEDRATE 106 (H) 05/20/2020   ESRSEDRATE 29 (H) 03/10/2020   CRP 2.8 06/15/2020   CRP 6.8 (H) 05/20/2020   CRP 1.2 (H) 03/10/2020   REPTSTATUS 05/18/2020 FINAL 05/12/2020   GRAMSTAIN NO WBC SEEN RARE GRAM POSITIVE COCCI IN PAIRS  05/12/2020   CULT  05/12/2020    MODERATE STAPHYLOCOCCUS AUREUS NO ANAEROBES ISOLATED Performed at [facility], [address], [place], [place] [address]    [facility] STAPHYLOCOCCUS AUREUS 05/12/2020     Lab Results  Component Value Date   ALBUMIN 3.8 08/04/2020   ALBUMIN 3.1 (L) 05/12/2020   ALBUMIN 3.8 03/10/2020    No results found for: MG No results found for: VD25OH  No results found for: PREALBUMIN CBC EXTENDED Latest Ref Rng & Units 08/04/2020 05/18/2020 05/12/2020  WBC 4.0 - 10.5 K/uL  13.8(H) 7.4 15.6(H)  RBC 4.22 - 5.81 MIL/uL 4.80 3.47(L) 4.41  HGB 13.0 - 17.0 g/dL 12.6(L) 9.6(L) 12.7(L)  HCT 39 - 52 % 41.7 30.0(L) 40.3  PLT 150 - 400 K/uL 374 584(H) 469(H)  NEUTROABS 1.7 - 7.7 K/uL - 5.5 -  LYMPHSABS 0.7 - 4.0 K/uL - 0.9 -     Body mass index is 29.02 kg/m.  Orders:  No orders of the defined types were placed in this encounter.  No orders of the defined types were placed in this encounter.    Procedures: No procedures performed  Clinical Data: No additional findings.  ROS:  All other systems negative, except as noted in the HPI. Review of Systems  Objective: Vital Signs: Ht 6' (1.829 m)   Wt 214 lb (97.1 kg)   BMI 29.02 kg/m   Specialty Comments:  No specialty comments available.  PMFS History: Patient Active Problem List   Diagnosis Date Noted  . Wound of ankle, left, sequela   . MSSA (methicillin susceptible Staphylococcus aureus) infection 05/18/2020  . Cardiac murmur due to mitral valve disorder 05/18/2020  . Abscess of bursa of left ankle 05/12/2020  . Hardware complicating wound infection (HCC)   . Left ankle pain 03/26/2020  . Charcot's joint, left ankle and foot    Past Medical History:  Diagnosis Date  .  Arthritis   . Cataract   . Charcot's arthropathy    left  . Hypertension     No family history on file.  Past Surgical History:  Procedure Laterality Date  . ANKLE FUSION Left 03/26/2020   Procedure: LEFT TIBIOCALCANEAL FUSION;  Surgeon: Nadara Mustard, MD;  Location: [facility] OR;  Service: Orthopedics;  Laterality: Left;  . CATARACT EXTRACTION Left   . HERNIA REPAIR Right   . I & D EXTREMITY Left 05/12/2020   Procedure: DEBRIDEMENT LEFT ANKLE & REMOVAL DEEP HARDWARE;  Surgeon: Nadara Mustard, MD;  Location: [facility] OR;  Service: Orthopedics;  Laterality: Left;  . I & D EXTREMITY Left 05/21/2020   Procedure: REPEAT IRRIGATION AND DEBRIDEMENT LEFT ANKLE;  Surgeon: Nadara Mustard, MD;  Location: [facility] OR;  Service: Orthopedics;   Laterality: Left;  . KNEE SURGERY Right    cartilage   Social History   Occupational History  . Not on file  Tobacco Use  . Smoking status: Current Some Day Smoker    Types: Cigars  . Smokeless tobacco: Never Used  . Tobacco comment: random  Vaping Use  . Vaping Use: Never used  Substance and Sexual Activity  . Alcohol use: Yes    Alcohol/week: 20.0 standard drinks    Types: 15 Cans of beer, 5 Glasses of wine per week  . Drug use: No  . Sexual activity: Not on file

## 2020-08-10 ENCOUNTER — Encounter (HOSPITAL_COMMUNITY): Payer: Self-pay | Admitting: Orthopedic Surgery

## 2020-08-16 ENCOUNTER — Encounter: Payer: Self-pay | Admitting: Orthopedic Surgery

## 2020-08-16 ENCOUNTER — Ambulatory Visit (INDEPENDENT_AMBULATORY_CARE_PROVIDER_SITE_OTHER): Payer: Self-pay | Admitting: Physician Assistant

## 2020-08-16 VITALS — Ht 72.0 in | Wt 214.0 lb

## 2020-08-16 DIAGNOSIS — Z945 Skin transplant status: Secondary | ICD-10-CM

## 2020-08-16 NOTE — Progress Notes (Signed)
Office Visit Note   Patient: Travis Mayo           Date of Birth: [birth year]           MRN: [id number] Visit Date: 08/16/2020              Requested by: Felix Pacini, FNP [address] [place],  [place] [address] PCP: Felix Pacini, FNP  Chief Complaint  Patient presents with  . Left Ankle - Routine Post Op    08/04/20 STSG left ankle       HPI: This is a pleasant 59 year old gentleman who is now 2 weeks status post thickness skin graft to his left ankle.  He has been nonweightbearing in a postop fracture boot doing daily Silvadene dressing changes  Assessment & Plan: Visit Diagnoses: No diagnosis found.  Plan: Patient was seen by Dr. Lajoyce Corners he may begin wearing his sock.  He will follow-up in 1 week at which time x-rays of his ankle should be obtained  Follow-Up Instructions: No follow-ups on file.   Ortho Exam  Patient is alert, oriented, no adenopathy, well-dressed, normal affect, normal respiratory effort. Examination demonstrates overall healthy vascular skin graft tissue throughout 85 to 95% of the wound.  There is one small section distally that has fibrinous tissue and is a bit below the rest of the grafting.  There is no foul odor he does have good epithelialization on the edges.  No cellulitis swelling overall is well controlled  Imaging: No results found.   Labs: Lab Results  Component Value Date   ESRSEDRATE 9 06/15/2020   ESRSEDRATE 106 (H) 05/20/2020   ESRSEDRATE 29 (H) 03/10/2020   CRP 2.8 06/15/2020   CRP 6.8 (H) 05/20/2020   CRP 1.2 (H) 03/10/2020   REPTSTATUS 05/18/2020 FINAL 05/12/2020   GRAMSTAIN NO WBC SEEN RARE GRAM POSITIVE COCCI IN PAIRS  05/12/2020   CULT  05/12/2020    MODERATE STAPHYLOCOCCUS AUREUS NO ANAEROBES ISOLATED Performed at [facility], [address] [place], [place] [address]    [facility] STAPHYLOCOCCUS AUREUS 05/12/2020     Lab Results  Component Value Date   ALBUMIN 3.8 08/04/2020   ALBUMIN 3.1  (L) 05/12/2020   ALBUMIN 3.8 03/10/2020    No results found for: MG No results found for: VD25OH  No results found for: PREALBUMIN CBC EXTENDED Latest Ref Rng & Units 08/04/2020 05/18/2020 05/12/2020  WBC 4.0 - 10.5 K/uL 13.8(H) 7.4 15.6(H)  RBC 4.22 - 5.81 MIL/uL 4.80 3.47(L) 4.41  HGB 13.0 - 17.0 g/dL 12.6(L) 9.6(L) 12.7(L)  HCT 39 - 52 % 41.7 30.0(L) 40.3  PLT 150 - 400 K/uL 374 584(H) 469(H)  NEUTROABS 1.7 - 7.7 K/uL - 5.5 -  LYMPHSABS 0.7 - 4.0 K/uL - 0.9 -     Body mass index is 29.02 kg/m.  Orders:  No orders of the defined types were placed in this encounter.  No orders of the defined types were placed in this encounter.    Procedures: No procedures performed  Clinical Data: No additional findings.  ROS:  All other systems negative, except as noted in the HPI. Review of Systems  Objective: Vital Signs: Ht 6' (1.829 m)   Wt 214 lb (97.1 kg)   BMI 29.02 kg/m   Specialty Comments:  No specialty comments available.  PMFS History: Patient Active Problem List   Diagnosis Date Noted  . Wound of ankle, left, sequela   . MSSA (methicillin susceptible Staphylococcus aureus) infection 05/18/2020  .  Cardiac murmur due to mitral valve disorder 05/18/2020  . Abscess of bursa of left ankle 05/12/2020  . Hardware complicating wound infection (HCC)   . Left ankle pain 03/26/2020  . Charcot's joint, left ankle and foot    Past Medical History:  Diagnosis Date  . Arthritis   . Cataract   . Charcot's arthropathy    left  . Hypertension     No family history on file.  Past Surgical History:  Procedure Laterality Date  . ANKLE FUSION Left 03/26/2020   Procedure: LEFT TIBIOCALCANEAL FUSION;  Surgeon: Nadara Mustard, MD;  Location: [facility] OR;  Service: Orthopedics;  Laterality: Left;  . CATARACT EXTRACTION Left   . HERNIA REPAIR Right   . I & D EXTREMITY Left 05/12/2020   Procedure: DEBRIDEMENT LEFT ANKLE & REMOVAL DEEP HARDWARE;  Surgeon: Nadara Mustard, MD;   Location: [facility] OR;  Service: Orthopedics;  Laterality: Left;  . I & D EXTREMITY Left 05/21/2020   Procedure: REPEAT IRRIGATION AND DEBRIDEMENT LEFT ANKLE;  Surgeon: Nadara Mustard, MD;  Location: [facility] OR;  Service: Orthopedics;  Laterality: Left;  . KNEE SURGERY Right    cartilage  . SKIN SPLIT GRAFT Left 08/04/2020   Procedure: PREPARE AND APPLY SPLIT THICKNESS SKIN GRAFT LEFT ANKLE;  Surgeon: Nadara Mustard, MD;  Location: [facility] OR;  Service: Orthopedics;  Laterality: Left;   Social History   Occupational History  . Not on file  Tobacco Use  . Smoking status: Current Some Day Smoker    Types: Cigars  . Smokeless tobacco: Never Used  . Tobacco comment: random  Vaping Use  . Vaping Use: Never used  Substance and Sexual Activity  . Alcohol use: Yes    Alcohol/week: 20.0 standard drinks    Types: 15 Cans of beer, 5 Glasses of wine per week  . Drug use: No  . Sexual activity: Not on file

## 2020-08-23 ENCOUNTER — Ambulatory Visit (INDEPENDENT_AMBULATORY_CARE_PROVIDER_SITE_OTHER): Payer: Self-pay | Admitting: Physician Assistant

## 2020-08-23 ENCOUNTER — Ambulatory Visit (INDEPENDENT_AMBULATORY_CARE_PROVIDER_SITE_OTHER): Payer: Self-pay

## 2020-08-23 ENCOUNTER — Encounter: Payer: Self-pay | Admitting: Orthopedic Surgery

## 2020-08-23 VITALS — Ht 72.0 in | Wt 214.0 lb

## 2020-08-23 DIAGNOSIS — M14672 Charcot's joint, left ankle and foot: Secondary | ICD-10-CM

## 2020-08-23 NOTE — Progress Notes (Signed)
Office Visit Note   Patient: Travis Mayo           Date of Birth: 06-Jan-1961           MRN: [id number] Visit Date: 08/23/2020              Requested by: Felix Pacini, FNP [address] [place],  [place] [address] PCP: Felix Pacini, FNP  Chief Complaint  Patient presents with  . Left Ankle - Routine Post Op    08/04/20 STSG       HPI: Patient presents today he is 3 weeks status post left ankle split thickness skin graft.  He is wearing his compression sock and has been nonweightbearing with crutches.  He feels the swelling has significantly decreased  Assessment & Plan: Visit Diagnoses:  1. Charcot's joint, left ankle and foot     Plan: Patient was seen by Dr. Lajoyce Corners we will order ankle x-rays in 3 weeks hopefully could discuss advancing his weightbearing at that time  Follow-Up Instructions: No follow-ups on file.   Ortho Exam  Patient is alert, oriented, no adenopathy, well-dressed, normal affect, normal respiratory effort. Focused examination of his skin graft demonstrates 98% healthy granulation tissue just a very small amount of fibrinous tissue in the central portion.  Excellent epithelialization around the wound edges.  Mild soft tissue swelling no cellulitis or signs of infection  Imaging: No results found.   Labs: Lab Results  Component Value Date   ESRSEDRATE 9 06/15/2020   ESRSEDRATE 106 (H) 05/20/2020   ESRSEDRATE 29 (H) 03/10/2020   CRP 2.8 06/15/2020   CRP 6.8 (H) 05/20/2020   CRP 1.2 (H) 03/10/2020   REPTSTATUS 05/18/2020 FINAL 05/12/2020   GRAMSTAIN NO WBC SEEN RARE GRAM POSITIVE COCCI IN PAIRS  05/12/2020   CULT  05/12/2020    MODERATE STAPHYLOCOCCUS AUREUS NO ANAEROBES ISOLATED Performed at [facility], [address] [place], [place] [address]    [facility] STAPHYLOCOCCUS AUREUS 05/12/2020     Lab Results  Component Value Date   ALBUMIN 3.8 08/04/2020   ALBUMIN 3.1 (L) 05/12/2020   ALBUMIN 3.8 03/10/2020    No  results found for: MG No results found for: VD25OH  No results found for: PREALBUMIN CBC EXTENDED Latest Ref Rng & Units 08/04/2020 05/18/2020 05/12/2020  WBC 4.0 - 10.5 K/uL 13.8(H) 7.4 15.6(H)  RBC 4.22 - 5.81 MIL/uL 4.80 3.47(L) 4.41  HGB 13.0 - 17.0 g/dL 12.6(L) 9.6(L) 12.7(L)  HCT 39 - 52 % 41.7 30.0(L) 40.3  PLT 150 - 400 K/uL 374 584(H) 469(H)  NEUTROABS 1.7 - 7.7 K/uL - 5.5 -  LYMPHSABS 0.7 - 4.0 K/uL - 0.9 -     Body mass index is 29.02 kg/m.  Orders:  Orders Placed This Encounter  Procedures  . XR Ankle Complete Left   No orders of the defined types were placed in this encounter.    Procedures: No procedures performed  Clinical Data: No additional findings.  ROS:  All other systems negative, except as noted in the HPI. Review of Systems  Objective: Vital Signs: Ht 6' (1.829 m)   Wt 214 lb (97.1 kg)   BMI 29.02 kg/m   Specialty Comments:  No specialty comments available.  PMFS History: Patient Active Problem List   Diagnosis Date Noted  . Wound of ankle, left, sequela   . MSSA (methicillin susceptible Staphylococcus aureus) infection 05/18/2020  . Cardiac murmur due to mitral valve disorder 05/18/2020  . Abscess of bursa  of left ankle 05/12/2020  . Hardware complicating wound infection (HCC)   . Left ankle pain 03/26/2020  . Charcot's joint, left ankle and foot    Past Medical History:  Diagnosis Date  . Arthritis   . Cataract   . Charcot's arthropathy    left  . Hypertension     No family history on file.  Past Surgical History:  Procedure Laterality Date  . ANKLE FUSION Left 03/26/2020   Procedure: LEFT TIBIOCALCANEAL FUSION;  Surgeon: Nadara Mustard, MD;  Location: [facility] OR;  Service: Orthopedics;  Laterality: Left;  . CATARACT EXTRACTION Left   . HERNIA REPAIR Right   . I & D EXTREMITY Left 05/12/2020   Procedure: DEBRIDEMENT LEFT ANKLE & REMOVAL DEEP HARDWARE;  Surgeon: Nadara Mustard, MD;  Location: [facility] OR;  Service: Orthopedics;   Laterality: Left;  . I & D EXTREMITY Left 05/21/2020   Procedure: REPEAT IRRIGATION AND DEBRIDEMENT LEFT ANKLE;  Surgeon: Nadara Mustard, MD;  Location: [facility] OR;  Service: Orthopedics;  Laterality: Left;  . KNEE SURGERY Right    cartilage  . SKIN SPLIT GRAFT Left 08/04/2020   Procedure: PREPARE AND APPLY SPLIT THICKNESS SKIN GRAFT LEFT ANKLE;  Surgeon: Nadara Mustard, MD;  Location: [facility] OR;  Service: Orthopedics;  Laterality: Left;   Social History   Occupational History  . Not on file  Tobacco Use  . Smoking status: Current Some Day Smoker    Types: Cigars  . Smokeless tobacco: Never Used  . Tobacco comment: random  Vaping Use  . Vaping Use: Never used  Substance and Sexual Activity  . Alcohol use: Yes    Alcohol/week: 20.0 standard drinks    Types: 15 Cans of beer, 5 Glasses of wine per week  . Drug use: No  . Sexual activity: Not on file

## 2020-09-13 ENCOUNTER — Ambulatory Visit: Payer: Self-pay

## 2020-09-13 ENCOUNTER — Ambulatory Visit (INDEPENDENT_AMBULATORY_CARE_PROVIDER_SITE_OTHER): Payer: Self-pay | Admitting: Physician Assistant

## 2020-09-13 ENCOUNTER — Encounter: Payer: Self-pay | Admitting: Orthopedic Surgery

## 2020-09-13 VITALS — Ht 72.0 in | Wt 214.0 lb

## 2020-09-13 DIAGNOSIS — M14672 Charcot's joint, left ankle and foot: Secondary | ICD-10-CM

## 2020-09-13 DIAGNOSIS — Z945 Skin transplant status: Secondary | ICD-10-CM

## 2020-09-13 NOTE — Progress Notes (Signed)
Office Visit Note   Patient: Travis Mayo           Date of Birth: [birth year]           MRN: [id number] Visit Date: 09/13/2020              Requested by: Felix Pacini, FNP [address] [place],  [place] [address] PCP: Felix Pacini, FNP  Chief Complaint  Patient presents with  . Left Ankle - Routine Post Op    08/04/20 STSG left ankle       HPI: Is almost 6 weeks status post split thickness skin grafting of his left ankle he has been using a sock and remains nonweightbearing  Assessment & Plan: Visit Diagnoses:  1. Charcot's joint, left ankle and foot   2. H/O skin graft     Plan: Continue current plan.  Patient will follow up in 4 weeks if he is healed could consider letting him to start weightbearing  Follow-Up Instructions: No follow-ups on file.   Ortho Exam  Patient is alert, oriented, no adenopathy, well-dressed, normal affect, normal respiratory effort. Examination healthy epithelialized tissue throughout all of the wound with the exception of one very small central area at the crease of his ankle.  Much of this tissue is at skin level.  There is no surrounding cellulitis.  No foul odor or drainage no evidence of any infection.  He still has moderate soft tissue swelling  Imaging: No results found.   Labs: Lab Results  Component Value Date   ESRSEDRATE 9 06/15/2020   ESRSEDRATE 106 (H) 05/20/2020   ESRSEDRATE 29 (H) 03/10/2020   CRP 2.8 06/15/2020   CRP 6.8 (H) 05/20/2020   CRP 1.2 (H) 03/10/2020   REPTSTATUS 05/18/2020 FINAL 05/12/2020   GRAMSTAIN NO WBC SEEN RARE GRAM POSITIVE COCCI IN PAIRS  05/12/2020   CULT  05/12/2020    MODERATE STAPHYLOCOCCUS AUREUS NO ANAEROBES ISOLATED Performed at [facility], [address], [place], [place] [address]    [facility] STAPHYLOCOCCUS AUREUS 05/12/2020     Lab Results  Component Value Date   ALBUMIN 3.8 08/04/2020   ALBUMIN 3.1 (L) 05/12/2020   ALBUMIN 3.8 03/10/2020    No  results found for: MG No results found for: VD25OH  No results found for: PREALBUMIN CBC EXTENDED Latest Ref Rng & Units 08/04/2020 05/18/2020 05/12/2020  WBC 4.0 - 10.5 K/uL 13.8(H) 7.4 15.6(H)  RBC 4.22 - 5.81 MIL/uL 4.80 3.47(L) 4.41  HGB 13.0 - 17.0 g/dL 12.6(L) 9.6(L) 12.7(L)  HCT 39.0 - 52.0 % 41.7 30.0(L) 40.3  PLT 150 - 400 K/uL 374 584(H) 469(H)  NEUTROABS 1.7 - 7.7 K/uL - 5.5 -  LYMPHSABS 0.7 - 4.0 K/uL - 0.9 -     Body mass index is 29.02 kg/m.  Orders:  Orders Placed This Encounter  Procedures  . XR Ankle Complete Left   No orders of the defined types were placed in this encounter.    Procedures: No procedures performed  Clinical Data: No additional findings.  ROS:  All other systems negative, except as noted in the HPI. Review of Systems  Objective: Vital Signs: Ht 6' (1.829 m)   Wt 214 lb (97.1 kg)   BMI 29.02 kg/m   Specialty Comments:  No specialty comments available.  PMFS History: Patient Active Problem List   Diagnosis Date Noted  . Wound of ankle, left, sequela   . MSSA (methicillin susceptible Staphylococcus aureus) infection 05/18/2020  . Cardiac murmur  due to mitral valve disorder 05/18/2020  . Abscess of bursa of left ankle 05/12/2020  . Hardware complicating wound infection (HCC)   . Left ankle pain 03/26/2020  . Charcot's joint, left ankle and foot    Past Medical History:  Diagnosis Date  . Arthritis   . Cataract   . Charcot's arthropathy    left  . Hypertension     No family history on file.  Past Surgical History:  Procedure Laterality Date  . ANKLE FUSION Left 03/26/2020   Procedure: LEFT TIBIOCALCANEAL FUSION;  Surgeon: Nadara Mustard, MD;  Location: [facility] OR;  Service: Orthopedics;  Laterality: Left;  . CATARACT EXTRACTION Left   . HERNIA REPAIR Right   . I & D EXTREMITY Left 05/12/2020   Procedure: DEBRIDEMENT LEFT ANKLE & REMOVAL DEEP HARDWARE;  Surgeon: Nadara Mustard, MD;  Location: [facility] OR;  Service: Orthopedics;   Laterality: Left;  . I & D EXTREMITY Left 05/21/2020   Procedure: REPEAT IRRIGATION AND DEBRIDEMENT LEFT ANKLE;  Surgeon: Nadara Mustard, MD;  Location: [facility] OR;  Service: Orthopedics;  Laterality: Left;  . KNEE SURGERY Right    cartilage  . SKIN SPLIT GRAFT Left 08/04/2020   Procedure: PREPARE AND APPLY SPLIT THICKNESS SKIN GRAFT LEFT ANKLE;  Surgeon: Nadara Mustard, MD;  Location: [facility] OR;  Service: Orthopedics;  Laterality: Left;   Social History   Occupational History  . Not on file  Tobacco Use  . Smoking status: Current Some Day Smoker    Types: Cigars  . Smokeless tobacco: Never Used  . Tobacco comment: random  Vaping Use  . Vaping Use: Never used  Substance and Sexual Activity  . Alcohol use: Yes    Alcohol/week: 20.0 standard drinks    Types: 15 Cans of beer, 5 Glasses of wine per week  . Drug use: No  . Sexual activity: Not on file

## 2020-09-16 ENCOUNTER — Encounter: Payer: Self-pay | Admitting: Orthopedic Surgery

## 2020-10-11 ENCOUNTER — Ambulatory Visit (INDEPENDENT_AMBULATORY_CARE_PROVIDER_SITE_OTHER): Payer: Self-pay | Admitting: Orthopedic Surgery

## 2020-10-11 ENCOUNTER — Encounter: Payer: Self-pay | Admitting: Orthopedic Surgery

## 2020-10-11 DIAGNOSIS — S91002S Unspecified open wound, left ankle, sequela: Secondary | ICD-10-CM

## 2020-10-11 NOTE — Progress Notes (Signed)
Office Visit Note   Patient: Travis Mayo           Date of Birth: 27-Jan-1961           MRN: [id number] Visit Date: 10/11/2020              Requested by: Felix Pacini, FNP [address] [place],  [place] [address] PCP: Felix Pacini, FNP  Chief Complaint  Patient presents with  . Left Ankle - Routine Post Op    08/04/20 STSG left ankle      HPI: Patient is a 60 year old gentleman status post skin graft chronic wound left ankle.  Patient states that the swelling and size of the wound has  improved significantly.  Assessment & Plan: Visit Diagnoses:  1. Ankle wound, left, sequela     Plan: Continue with the compression sock patient may increase to weightbearing as tolerated in the fracture boot and patient may begin a stationary bicycle for aerobic exercise.  Follow-Up Instructions: Return in about 3 weeks (around 11/01/2020).   Ortho Exam  Patient is alert, oriented, no adenopathy, well-dressed, normal affect, normal respiratory effort. Examination the wound is flat 100% healthy granulation tissue is good dorsalis pedis pulse significant decrease swelling the wound is 15 cm in length 2 cm at its widest point.  No cellulitis no drainage no signs of infection.  Imaging: No results found.   Labs: Lab Results  Component Value Date   ESRSEDRATE 9 06/15/2020   ESRSEDRATE 106 (H) 05/20/2020   ESRSEDRATE 29 (H) 03/10/2020   CRP 2.8 06/15/2020   CRP 6.8 (H) 05/20/2020   CRP 1.2 (H) 03/10/2020   REPTSTATUS 05/18/2020 FINAL 05/12/2020   GRAMSTAIN NO WBC SEEN RARE GRAM POSITIVE COCCI IN PAIRS  05/12/2020   CULT  05/12/2020    MODERATE STAPHYLOCOCCUS AUREUS NO ANAEROBES ISOLATED Performed at [facility], [address], [place], [place] [address]    [facility] STAPHYLOCOCCUS AUREUS 05/12/2020     Lab Results  Component Value Date   ALBUMIN 3.8 08/04/2020   ALBUMIN 3.1 (L) 05/12/2020   ALBUMIN 3.8 03/10/2020    No results found for: MG No  results found for: VD25OH  No results found for: PREALBUMIN CBC EXTENDED Latest Ref Rng & Units 08/04/2020 05/18/2020 05/12/2020  WBC 4.0 - 10.5 K/uL 13.8(H) 7.4 15.6(H)  RBC 4.22 - 5.81 MIL/uL 4.80 3.47(L) 4.41  HGB 13.0 - 17.0 g/dL 12.6(L) 9.6(L) 12.7(L)  HCT 39.0 - 52.0 % 41.7 30.0(L) 40.3  PLT 150 - 400 K/uL 374 584(H) 469(H)  NEUTROABS 1.7 - 7.7 K/uL - 5.5 -  LYMPHSABS 0.7 - 4.0 K/uL - 0.9 -     There is no height or weight on file to calculate BMI.  Orders:  No orders of the defined types were placed in this encounter.  No orders of the defined types were placed in this encounter.    Procedures: No procedures performed  Clinical Data: No additional findings.  ROS:  All other systems negative, except as noted in the HPI. Review of Systems  Objective: Vital Signs: There were no vitals taken for this visit.  Specialty Comments:  No specialty comments available.  PMFS History: Patient Active Problem List   Diagnosis Date Noted  . Wound of ankle, left, sequela   . MSSA (methicillin susceptible Staphylococcus aureus) infection 05/18/2020  . Cardiac murmur due to mitral valve disorder 05/18/2020  . Abscess of bursa of left ankle 05/12/2020  . Hardware complicating wound infection (HCC)   .  Left ankle pain 03/26/2020  . Charcot's joint, left ankle and foot    Past Medical History:  Diagnosis Date  . Arthritis   . Cataract   . Charcot's arthropathy    left  . Hypertension     History reviewed. No pertinent family history.  Past Surgical History:  Procedure Laterality Date  . ANKLE FUSION Left 03/26/2020   Procedure: LEFT TIBIOCALCANEAL FUSION;  Surgeon: Nadara Mustard, MD;  Location: [facility] OR;  Service: Orthopedics;  Laterality: Left;  . CATARACT EXTRACTION Left   . HERNIA REPAIR Right   . I & D EXTREMITY Left 05/12/2020   Procedure: DEBRIDEMENT LEFT ANKLE & REMOVAL DEEP HARDWARE;  Surgeon: Nadara Mustard, MD;  Location: [facility] OR;  Service: Orthopedics;   Laterality: Left;  . I & D EXTREMITY Left 05/21/2020   Procedure: REPEAT IRRIGATION AND DEBRIDEMENT LEFT ANKLE;  Surgeon: Nadara Mustard, MD;  Location: [facility] OR;  Service: Orthopedics;  Laterality: Left;  . KNEE SURGERY Right    cartilage  . SKIN SPLIT GRAFT Left 08/04/2020   Procedure: PREPARE AND APPLY SPLIT THICKNESS SKIN GRAFT LEFT ANKLE;  Surgeon: Nadara Mustard, MD;  Location: [facility] OR;  Service: Orthopedics;  Laterality: Left;   Social History   Occupational History  . Not on file  Tobacco Use  . Smoking status: Current Some Day Smoker    Types: Cigars  . Smokeless tobacco: Never Used  . Tobacco comment: random  Vaping Use  . Vaping Use: Never used  Substance and Sexual Activity  . Alcohol use: Yes    Alcohol/week: 20.0 standard drinks    Types: 15 Cans of beer, 5 Glasses of wine per week  . Drug use: No  . Sexual activity: Not on file

## 2020-10-21 ENCOUNTER — Encounter: Payer: Self-pay | Admitting: Orthopedic Surgery

## 2020-10-23 ENCOUNTER — Inpatient Hospital Stay (HOSPITAL_COMMUNITY)
Admission: EM | Admit: 2020-10-23 | Discharge: 2020-11-02 | DRG: 853 | Disposition: E | Payer: Self-pay | Attending: Pulmonary Disease | Admitting: Pulmonary Disease

## 2020-10-23 ENCOUNTER — Emergency Department (HOSPITAL_COMMUNITY): Payer: Self-pay

## 2020-10-23 ENCOUNTER — Encounter (HOSPITAL_COMMUNITY): Payer: Self-pay | Admitting: Emergency Medicine

## 2020-10-23 ENCOUNTER — Inpatient Hospital Stay (HOSPITAL_COMMUNITY): Payer: Self-pay | Admitting: Anesthesiology

## 2020-10-23 ENCOUNTER — Encounter (HOSPITAL_COMMUNITY): Admission: EM | Disposition: E | Payer: Self-pay | Source: Home / Self Care | Attending: Pulmonary Disease

## 2020-10-23 DIAGNOSIS — Z6831 Body mass index (BMI) 31.0-31.9, adult: Secondary | ICD-10-CM

## 2020-10-23 DIAGNOSIS — E874 Mixed disorder of acid-base balance: Secondary | ICD-10-CM | POA: Diagnosis present

## 2020-10-23 DIAGNOSIS — N179 Acute kidney failure, unspecified: Secondary | ICD-10-CM | POA: Diagnosis present

## 2020-10-23 DIAGNOSIS — Z66 Do not resuscitate: Secondary | ICD-10-CM | POA: Diagnosis not present

## 2020-10-23 DIAGNOSIS — J9601 Acute respiratory failure with hypoxia: Secondary | ICD-10-CM | POA: Diagnosis present

## 2020-10-23 DIAGNOSIS — I6330 Cerebral infarction due to thrombosis of unspecified cerebral artery: Secondary | ICD-10-CM | POA: Insufficient documentation

## 2020-10-23 DIAGNOSIS — R471 Dysarthria and anarthria: Secondary | ICD-10-CM | POA: Diagnosis present

## 2020-10-23 DIAGNOSIS — Z515 Encounter for palliative care: Secondary | ICD-10-CM

## 2020-10-23 DIAGNOSIS — I330 Acute and subacute infective endocarditis: Secondary | ICD-10-CM | POA: Diagnosis present

## 2020-10-23 DIAGNOSIS — M1990 Unspecified osteoarthritis, unspecified site: Secondary | ICD-10-CM | POA: Diagnosis present

## 2020-10-23 DIAGNOSIS — I63511 Cerebral infarction due to unspecified occlusion or stenosis of right middle cerebral artery: Secondary | ICD-10-CM

## 2020-10-23 DIAGNOSIS — I071 Rheumatic tricuspid insufficiency: Secondary | ICD-10-CM | POA: Diagnosis present

## 2020-10-23 DIAGNOSIS — I76 Septic arterial embolism: Secondary | ICD-10-CM | POA: Diagnosis present

## 2020-10-23 DIAGNOSIS — E11649 Type 2 diabetes mellitus with hypoglycemia without coma: Secondary | ICD-10-CM | POA: Diagnosis not present

## 2020-10-23 DIAGNOSIS — B958 Unspecified staphylococcus as the cause of diseases classified elsewhere: Secondary | ICD-10-CM

## 2020-10-23 DIAGNOSIS — R7881 Bacteremia: Secondary | ICD-10-CM

## 2020-10-23 DIAGNOSIS — Z981 Arthrodesis status: Secondary | ICD-10-CM

## 2020-10-23 DIAGNOSIS — B9561 Methicillin susceptible Staphylococcus aureus infection as the cause of diseases classified elsewhere: Secondary | ICD-10-CM

## 2020-10-23 DIAGNOSIS — E876 Hypokalemia: Secondary | ICD-10-CM | POA: Diagnosis not present

## 2020-10-23 DIAGNOSIS — R34 Anuria and oliguria: Secondary | ICD-10-CM | POA: Diagnosis not present

## 2020-10-23 DIAGNOSIS — Z452 Encounter for adjustment and management of vascular access device: Secondary | ICD-10-CM

## 2020-10-23 DIAGNOSIS — J9691 Respiratory failure, unspecified with hypoxia: Secondary | ICD-10-CM

## 2020-10-23 DIAGNOSIS — E1165 Type 2 diabetes mellitus with hyperglycemia: Secondary | ICD-10-CM | POA: Diagnosis not present

## 2020-10-23 DIAGNOSIS — K8591 Acute pancreatitis with uninfected necrosis, unspecified: Secondary | ICD-10-CM | POA: Diagnosis present

## 2020-10-23 DIAGNOSIS — Z20822 Contact with and (suspected) exposure to covid-19: Secondary | ICD-10-CM | POA: Diagnosis present

## 2020-10-23 DIAGNOSIS — A4101 Sepsis due to Methicillin susceptible Staphylococcus aureus: Principal | ICD-10-CM | POA: Diagnosis present

## 2020-10-23 DIAGNOSIS — E11610 Type 2 diabetes mellitus with diabetic neuropathic arthropathy: Secondary | ICD-10-CM | POA: Diagnosis present

## 2020-10-23 DIAGNOSIS — R4701 Aphasia: Secondary | ICD-10-CM | POA: Diagnosis present

## 2020-10-23 DIAGNOSIS — I63513 Cerebral infarction due to unspecified occlusion or stenosis of bilateral middle cerebral arteries: Secondary | ICD-10-CM | POA: Diagnosis present

## 2020-10-23 DIAGNOSIS — G8104 Flaccid hemiplegia affecting left nondominant side: Secondary | ICD-10-CM | POA: Diagnosis present

## 2020-10-23 DIAGNOSIS — I10 Essential (primary) hypertension: Secondary | ICD-10-CM | POA: Diagnosis present

## 2020-10-23 DIAGNOSIS — E669 Obesity, unspecified: Secondary | ICD-10-CM | POA: Diagnosis present

## 2020-10-23 DIAGNOSIS — I639 Cerebral infarction, unspecified: Secondary | ICD-10-CM

## 2020-10-23 DIAGNOSIS — E785 Hyperlipidemia, unspecified: Secondary | ICD-10-CM | POA: Diagnosis present

## 2020-10-23 DIAGNOSIS — R29715 NIHSS score 15: Secondary | ICD-10-CM | POA: Diagnosis present

## 2020-10-23 DIAGNOSIS — Z79899 Other long term (current) drug therapy: Secondary | ICD-10-CM

## 2020-10-23 DIAGNOSIS — D696 Thrombocytopenia, unspecified: Secondary | ICD-10-CM | POA: Diagnosis present

## 2020-10-23 DIAGNOSIS — F17290 Nicotine dependence, other tobacco product, uncomplicated: Secondary | ICD-10-CM | POA: Diagnosis present

## 2020-10-23 DIAGNOSIS — K668 Other specified disorders of peritoneum: Secondary | ICD-10-CM

## 2020-10-23 DIAGNOSIS — J9602 Acute respiratory failure with hypercapnia: Secondary | ICD-10-CM

## 2020-10-23 DIAGNOSIS — R6521 Severe sepsis with septic shock: Secondary | ICD-10-CM | POA: Diagnosis not present

## 2020-10-23 HISTORY — PX: APPLICATION OF WOUND VAC: SHX5189

## 2020-10-23 HISTORY — PX: LAPAROTOMY: SHX154

## 2020-10-23 LAB — I-STAT ARTERIAL BLOOD GAS, ED
Acid-base deficit: 11 mmol/L — ABNORMAL HIGH (ref 0.0–2.0)
Bicarbonate: 13.2 mmol/L — ABNORMAL LOW (ref 20.0–28.0)
Calcium, Ion: 1.09 mmol/L — ABNORMAL LOW (ref 1.15–1.40)
HCT: 34 % — ABNORMAL LOW (ref 39.0–52.0)
Hemoglobin: 11.6 g/dL — ABNORMAL LOW (ref 13.0–17.0)
O2 Saturation: 100 %
Patient temperature: 98.6
Potassium: 3.2 mmol/L — ABNORMAL LOW (ref 3.5–5.1)
Sodium: 140 mmol/L (ref 135–145)
TCO2: 14 mmol/L — ABNORMAL LOW (ref 22–32)
pCO2 arterial: 24.6 mmHg — ABNORMAL LOW (ref 32.0–48.0)
pH, Arterial: 7.337 — ABNORMAL LOW (ref 7.350–7.450)
pO2, Arterial: 241 mmHg — ABNORMAL HIGH (ref 83.0–108.0)

## 2020-10-23 LAB — URINALYSIS, ROUTINE W REFLEX MICROSCOPIC
Bacteria, UA: NONE SEEN
Bilirubin Urine: NEGATIVE
Glucose, UA: NEGATIVE mg/dL
Ketones, ur: NEGATIVE mg/dL
Leukocytes,Ua: NEGATIVE
Nitrite: NEGATIVE
Protein, ur: 100 mg/dL — AB
Specific Gravity, Urine: 1.017 (ref 1.005–1.030)
pH: 6 (ref 5.0–8.0)

## 2020-10-23 LAB — DIFFERENTIAL
Abs Immature Granulocytes: 0.15 10*3/uL — ABNORMAL HIGH (ref 0.00–0.07)
Basophils Absolute: 0.1 10*3/uL (ref 0.0–0.1)
Basophils Relative: 0 %
Eosinophils Absolute: 0 10*3/uL (ref 0.0–0.5)
Eosinophils Relative: 0 %
Immature Granulocytes: 1 %
Lymphocytes Relative: 1 %
Lymphs Abs: 0.1 10*3/uL — ABNORMAL LOW (ref 0.7–4.0)
Monocytes Absolute: 0.4 10*3/uL (ref 0.1–1.0)
Monocytes Relative: 3 %
Neutro Abs: 12.1 10*3/uL — ABNORMAL HIGH (ref 1.7–7.7)
Neutrophils Relative %: 95 %

## 2020-10-23 LAB — CBC
HCT: 39.6 % (ref 39.0–52.0)
Hemoglobin: 12.7 g/dL — ABNORMAL LOW (ref 13.0–17.0)
MCH: 27.5 pg (ref 26.0–34.0)
MCHC: 32.1 g/dL (ref 30.0–36.0)
MCV: 85.7 fL (ref 80.0–100.0)
Platelets: 136 10*3/uL — ABNORMAL LOW (ref 150–400)
RBC: 4.62 MIL/uL (ref 4.22–5.81)
RDW: 16.9 % — ABNORMAL HIGH (ref 11.5–15.5)
WBC: 12.8 10*3/uL — ABNORMAL HIGH (ref 4.0–10.5)
nRBC: 0 % (ref 0.0–0.2)

## 2020-10-23 LAB — GLUCOSE, CAPILLARY
Glucose-Capillary: 142 mg/dL — ABNORMAL HIGH (ref 70–99)
Glucose-Capillary: 66 mg/dL — ABNORMAL LOW (ref 70–99)

## 2020-10-23 LAB — HEMOGLOBIN A1C
Hgb A1c MFr Bld: 5.9 % — ABNORMAL HIGH (ref 4.8–5.6)
Mean Plasma Glucose: 122.63 mg/dL

## 2020-10-23 LAB — COMPREHENSIVE METABOLIC PANEL
ALT: 62 U/L — ABNORMAL HIGH (ref 0–44)
AST: 67 U/L — ABNORMAL HIGH (ref 15–41)
Albumin: 2.2 g/dL — ABNORMAL LOW (ref 3.5–5.0)
Alkaline Phosphatase: 133 U/L — ABNORMAL HIGH (ref 38–126)
Anion gap: 18 — ABNORMAL HIGH (ref 5–15)
BUN: 61 mg/dL — ABNORMAL HIGH (ref 6–20)
CO2: 12 mmol/L — ABNORMAL LOW (ref 22–32)
Calcium: 8.5 mg/dL — ABNORMAL LOW (ref 8.9–10.3)
Chloride: 108 mmol/L (ref 98–111)
Creatinine, Ser: 3.12 mg/dL — ABNORMAL HIGH (ref 0.61–1.24)
GFR, Estimated: 22 mL/min — ABNORMAL LOW (ref >60)
Glucose, Bld: 165 mg/dL — ABNORMAL HIGH (ref 70–99)
Potassium: 3.4 mmol/L — ABNORMAL LOW (ref 3.5–5.1)
Sodium: 138 mmol/L (ref 135–145)
Total Bilirubin: 1.1 mg/dL (ref 0.3–1.2)
Total Protein: 5.8 g/dL — ABNORMAL LOW (ref 6.5–8.1)

## 2020-10-23 LAB — I-STAT CHEM 8, ED
BUN: 54 mg/dL — ABNORMAL HIGH (ref 6–20)
Calcium, Ion: 1.08 mmol/L — ABNORMAL LOW (ref 1.15–1.40)
Chloride: 112 mmol/L — ABNORMAL HIGH (ref 98–111)
Creatinine, Ser: 2.9 mg/dL — ABNORMAL HIGH (ref 0.61–1.24)
Glucose, Bld: 155 mg/dL — ABNORMAL HIGH (ref 70–99)
HCT: 40 % (ref 39.0–52.0)
Hemoglobin: 13.6 g/dL (ref 13.0–17.0)
Potassium: 3.4 mmol/L — ABNORMAL LOW (ref 3.5–5.1)
Sodium: 138 mmol/L (ref 135–145)
TCO2: 14 mmol/L — ABNORMAL LOW (ref 22–32)

## 2020-10-23 LAB — MRSA PCR SCREENING: MRSA by PCR: POSITIVE — AB

## 2020-10-23 LAB — RAPID URINE DRUG SCREEN, HOSP PERFORMED
Amphetamines: NOT DETECTED
Barbiturates: NOT DETECTED
Benzodiazepines: NOT DETECTED
Cocaine: NOT DETECTED
Opiates: NOT DETECTED
Tetrahydrocannabinol: NOT DETECTED

## 2020-10-23 LAB — PROTIME-INR
INR: 1.2 (ref 0.8–1.2)
Prothrombin Time: 14.5 seconds (ref 11.4–15.2)

## 2020-10-23 LAB — ETHANOL: Alcohol, Ethyl (B): <10 mg/dL (ref <10)

## 2020-10-23 LAB — LACTIC ACID, PLASMA
Lactic Acid, Venous: 3.6 mmol/L (ref 0.5–1.9)
Lactic Acid, Venous: 2.9 mmol/L (ref 0.5–1.9)
Lactic Acid, Venous: 3.1 mmol/L (ref 0.5–1.9)

## 2020-10-23 LAB — HIV ANTIBODY (ROUTINE TESTING W REFLEX): HIV Screen 4th Generation wRfx: NONREACTIVE

## 2020-10-23 LAB — SARS CORONAVIRUS 2 BY RT PCR (HOSPITAL ORDER, PERFORMED IN ~~LOC~~ HOSPITAL LAB): SARS Coronavirus 2: NEGATIVE

## 2020-10-23 LAB — APTT: aPTT: 25 seconds (ref 24–36)

## 2020-10-23 SURGERY — APPLICATION, WOUND VAC
Anesthesia: General | Site: Abdomen

## 2020-10-23 MED ORDER — METRONIDAZOLE IN NACL 5-0.79 MG/ML-% IV SOLN
500.0000 mg | Freq: Once | INTRAVENOUS | Status: DC
  Administered 2020-10-23: 500 mg via INTRAVENOUS

## 2020-10-23 MED ORDER — LACTATED RINGERS IV SOLN: INTRAVENOUS | Status: DC | PRN

## 2020-10-23 MED ORDER — SODIUM CHLORIDE 0.9% IV SOLUTION: Freq: Once | INTRAVENOUS | Status: DC

## 2020-10-23 MED ORDER — NOREPINEPHRINE 4 MG/250ML-% IV SOLN
INTRAVENOUS | Status: DC | PRN
  Administered 2020-10-23: 4 ug/min via INTRAVENOUS

## 2020-10-23 MED ORDER — SODIUM BICARBONATE 8.4 % IV SOLN
INTRAVENOUS | Status: DC
  Filled 2020-10-23 (×3): qty 850

## 2020-10-23 MED ORDER — VASOPRESSIN 20 UNIT/ML IV SOLN
INTRAVENOUS | Status: AC
  Filled 2020-10-23: qty 1

## 2020-10-23 MED ORDER — SODIUM CHLORIDE 0.9 % IV BOLUS (SEPSIS)
1000.0000 mL | Freq: Once | INTRAVENOUS | Status: AC
  Administered 2020-10-23: 1000 mL via INTRAVENOUS

## 2020-10-23 MED ORDER — VANCOMYCIN VARIABLE DOSE PER UNSTABLE RENAL FUNCTION (PHARMACIST DOSING): Status: DC

## 2020-10-23 MED ORDER — SUCCINYLCHOLINE CHLORIDE 20 MG/ML IJ SOLN
INTRAMUSCULAR | Status: AC | PRN
  Administered 2020-10-23: 100 mg via INTRAVENOUS

## 2020-10-23 MED ORDER — ETOMIDATE 2 MG/ML IV SOLN
INTRAVENOUS | Status: AC | PRN
  Administered 2020-10-23: 20 mg via INTRAVENOUS

## 2020-10-23 MED ORDER — MUPIROCIN 2 % EX OINT
1.0000 "application " | TOPICAL_OINTMENT | Freq: Two times a day (BID) | CUTANEOUS | Status: DC
  Administered 2020-10-23 – 2020-10-24 (×2): 1 via NASAL
  Filled 2020-10-23: qty 22

## 2020-10-23 MED ORDER — CHLORHEXIDINE GLUCONATE CLOTH 2 % EX PADS
6.0000 | MEDICATED_PAD | Freq: Every day | CUTANEOUS | Status: DC
  Administered 2020-10-24: 6 via TOPICAL

## 2020-10-23 MED ORDER — DEXTROSE 50 % IV SOLN
INTRAVENOUS | Status: AC
  Administered 2020-10-23: 50 mL
  Filled 2020-10-23: qty 50

## 2020-10-23 MED ORDER — SODIUM CHLORIDE 0.9 % IV BOLUS
1000.0000 mL | Freq: Once | INTRAVENOUS | Status: AC
  Administered 2020-10-23: 1000 mL via INTRAVENOUS

## 2020-10-23 MED ORDER — ETOMIDATE 2 MG/ML IV SOLN: 20.0000 mg | Freq: Once | INTRAVENOUS | Status: DC

## 2020-10-23 MED ORDER — CALCIUM CHLORIDE 10 % IV SOLN
INTRAVENOUS | Status: DC | PRN
  Administered 2020-10-23 (×2): 500 mg via INTRAVENOUS

## 2020-10-23 MED ORDER — INSULIN ASPART 100 UNIT/ML ~~LOC~~ SOLN: 0.0000 [IU] | SUBCUTANEOUS | Status: DC

## 2020-10-23 MED ORDER — PANTOPRAZOLE SODIUM 40 MG IV SOLR: 40.0000 mg | Freq: Every day | INTRAVENOUS | Status: DC

## 2020-10-23 MED ORDER — SODIUM CHLORIDE 0.9 % IV BOLUS (SEPSIS)
1000.0000 mL | Freq: Once | INTRAVENOUS | Status: AC
  Administered 2020-10-23 (×2): 1000 mL via INTRAVENOUS

## 2020-10-23 MED ORDER — SODIUM CHLORIDE 0.9 % IV SOLN
2.0000 g | Freq: Once | INTRAVENOUS | Status: AC
  Administered 2020-10-23: 2 g via INTRAVENOUS
  Filled 2020-10-23: qty 2

## 2020-10-23 MED ORDER — PANTOPRAZOLE SODIUM 40 MG IV SOLR
40.0000 mg | Freq: Every day | INTRAVENOUS | Status: DC
  Administered 2020-10-23: 40 mg via INTRAVENOUS
  Filled 2020-10-23: qty 40

## 2020-10-23 MED ORDER — FENTANYL 2500MCG IN NS 250ML (10MCG/ML) PREMIX INFUSION
50.0000 ug/h | INTRAVENOUS | Status: DC
  Administered 2020-10-24: 50 ug/h via INTRAVENOUS
  Filled 2020-10-23: qty 250

## 2020-10-23 MED ORDER — PHENYLEPHRINE HCL-NACL 10-0.9 MG/250ML-% IV SOLN
25.0000 ug/min | INTRAVENOUS | Status: DC
  Administered 2020-10-23: 190 ug/min via INTRAVENOUS
  Administered 2020-10-23: 25 ug/min via INTRAVENOUS
  Filled 2020-10-23 (×3): qty 250

## 2020-10-23 MED ORDER — FENTANYL BOLUS VIA INFUSION
50.0000 ug | INTRAVENOUS | Status: DC | PRN
  Administered 2020-10-24 (×2): 50 ug via INTRAVENOUS
  Filled 2020-10-23: qty 50

## 2020-10-23 MED ORDER — SODIUM BICARBONATE 8.4 % IV SOLN
INTRAVENOUS | Status: DC | PRN
  Administered 2020-10-23 – 2020-10-24 (×2): 50 meq via INTRAVENOUS

## 2020-10-23 MED ORDER — 0.9 % SODIUM CHLORIDE (POUR BTL) OPTIME
TOPICAL | Status: DC | PRN
  Administered 2020-10-23: 1000 mL

## 2020-10-23 MED ORDER — VANCOMYCIN HCL 2000 MG/400ML IV SOLN
2000.0000 mg | Freq: Once | INTRAVENOUS | Status: AC
  Administered 2020-10-23: 2000 mg via INTRAVENOUS
  Filled 2020-10-23: qty 400

## 2020-10-23 MED ORDER — SODIUM CHLORIDE 0.9 % IV SOLN
2.0000 g | INTRAVENOUS | Status: DC
  Administered 2020-10-23: 2 g via INTRAVENOUS

## 2020-10-23 MED ORDER — LACTATED RINGERS IV SOLN: INTRAVENOUS | Status: DC

## 2020-10-23 MED ORDER — STROKE: EARLY STAGES OF RECOVERY BOOK
Freq: Once | Status: AC
  Filled 2020-10-23: qty 1

## 2020-10-23 MED ORDER — LACTATED RINGERS IV BOLUS
1000.0000 mL | Freq: Once | INTRAVENOUS | Status: AC
  Administered 2020-10-23: 1000 mL via INTRAVENOUS

## 2020-10-23 MED ORDER — SODIUM CHLORIDE 0.9 % IV SOLN
250.0000 mL | INTRAVENOUS | Status: DC
  Administered 2020-10-24: 250 mL via INTRAVENOUS

## 2020-10-23 MED ORDER — IPRATROPIUM-ALBUTEROL 0.5-2.5 (3) MG/3ML IN SOLN: 3.0000 mL | RESPIRATORY_TRACT | Status: DC | PRN

## 2020-10-23 MED ORDER — METRONIDAZOLE IN NACL 5-0.79 MG/ML-% IV SOLN
500.0000 mg | Freq: Three times a day (TID) | INTRAVENOUS | Status: DC
  Administered 2020-10-24 (×2): 500 mg via INTRAVENOUS
  Filled 2020-10-23 (×2): qty 100

## 2020-10-23 MED ORDER — PROPOFOL 1000 MG/100ML IV EMUL
5.0000 ug/kg/min | INTRAVENOUS | Status: DC
  Administered 2020-10-23: 40 ug/kg/min via INTRAVENOUS
  Administered 2020-10-23: 10 ug/kg/min via INTRAVENOUS
  Filled 2020-10-23 (×2): qty 100

## 2020-10-23 MED ORDER — SUCCINYLCHOLINE CHLORIDE 20 MG/ML IJ SOLN
100.0000 mg | Freq: Once | INTRAMUSCULAR | Status: DC
  Filled 2020-10-23: qty 5

## 2020-10-23 MED ORDER — FENTANYL CITRATE (PF) 100 MCG/2ML IJ SOLN: 50.0000 ug | Freq: Once | INTRAMUSCULAR | Status: DC

## 2020-10-23 SURGICAL SUPPLY — 60 items
ADH SKN CLS APL DERMABOND .7 (GAUZE/BANDAGES/DRESSINGS) ×2
APL PRP STRL LF DISP 70% ISPRP (MISCELLANEOUS) ×1
BLADE CLIPPER SURG (BLADE) IMPLANT
CANISTER SUCT 3000ML PPV (MISCELLANEOUS) ×2 IMPLANT
CANISTER WOUNDNEG PRESSURE 500 (CANNISTER) ×2 IMPLANT
CHLORAPREP W/TINT 26 (MISCELLANEOUS) ×2 IMPLANT
COVER SURGICAL LIGHT HANDLE (MISCELLANEOUS) ×2 IMPLANT
COVER WAND RF STERILE (DRAPES) ×2 IMPLANT
DERMABOND ADVANCED (GAUZE/BANDAGES/DRESSINGS) ×2
DERMABOND ADVANCED .7 DNX12 (GAUZE/BANDAGES/DRESSINGS) ×2 IMPLANT
DRAIN CHANNEL 19F RND (DRAIN) ×4 IMPLANT
DRAPE INCISE IOBAN 66X45 STRL (DRAPES) ×2 IMPLANT
DRAPE INCISE IOBAN 85X60 (DRAPES) ×2 IMPLANT
DRAPE LAPAROSCOPIC ABDOMINAL (DRAPES) ×2 IMPLANT
DRAPE WARM FLUID 44X44 (DRAPES) ×2 IMPLANT
DRSG OPSITE POSTOP 4X10 (GAUZE/BANDAGES/DRESSINGS) IMPLANT
DRSG OPSITE POSTOP 4X8 (GAUZE/BANDAGES/DRESSINGS) IMPLANT
DRSG VAC ATS LRG SENSATRAC (GAUZE/BANDAGES/DRESSINGS) ×2 IMPLANT
DRSG VERAFLOW VAC LRG (GAUZE/BANDAGES/DRESSINGS) ×2 IMPLANT
ELECT BLADE 6.5 EXT (BLADE) ×2 IMPLANT
ELECT CAUTERY BLADE 6.4 (BLADE) ×2 IMPLANT
ELECT REM PT RETURN 9FT ADLT (ELECTROSURGICAL) ×2
ELECTRODE REM PT RTRN 9FT ADLT (ELECTROSURGICAL) ×1 IMPLANT
EVACUATOR SILICONE 100CC (DRAIN) ×4 IMPLANT
GLOVE BIOGEL PI IND STRL 6 (GLOVE) ×2 IMPLANT
GLOVE BIOGEL PI INDICATOR 6 (GLOVE) ×2
GLOVE BIOGEL PI MICRO 5.5 (GLOVE) ×1
GLOVE BIOGEL PI MICRO STRL 5.5 (GLOVE) ×1 IMPLANT
GLOVE BIOGEL PI ORTHO PRO SZ7 (GLOVE) ×1
GLOVE ECLIPSE 6.5 STRL STRAW (GLOVE) ×2 IMPLANT
GLOVE INDICATOR 7.0 STRL GRN (GLOVE) ×2 IMPLANT
GLOVE PI ORTHO PRO STRL SZ7 (GLOVE) ×1 IMPLANT
GOWN STRL REUS W/ TWL LRG LVL3 (GOWN DISPOSABLE) ×2 IMPLANT
GOWN STRL REUS W/TWL LRG LVL3 (GOWN DISPOSABLE) ×4
HANDLE SUCTION POOLE (INSTRUMENTS) ×1 IMPLANT
KIT BASIN OR (CUSTOM PROCEDURE TRAY) ×2 IMPLANT
KIT TURNOVER KIT B (KITS) ×2 IMPLANT
LIGASURE IMPACT 36 18CM CVD LR (INSTRUMENTS) ×2 IMPLANT
NS IRRIG 1000ML POUR BTL (IV SOLUTION) ×4 IMPLANT
PACK GENERAL/GYN (CUSTOM PROCEDURE TRAY) ×2 IMPLANT
PAD ARMBOARD 7.5X6 YLW CONV (MISCELLANEOUS) ×2 IMPLANT
PENCIL SMOKE EVACUATOR (MISCELLANEOUS) ×2 IMPLANT
SLEEVE SUCTION CATH 165 (SLEEVE) ×2 IMPLANT
SPECIMEN JAR LARGE (MISCELLANEOUS) IMPLANT
SPONGE LAP 18X18 RF (DISPOSABLE) IMPLANT
STAPLER VISISTAT 35W (STAPLE) IMPLANT
SUCTION POOLE HANDLE (INSTRUMENTS) ×2
SUT ETHILON 2 0 FS 18 (SUTURE) ×4 IMPLANT
SUT MNCRL AB 4-0 PS2 18 (SUTURE) ×4 IMPLANT
SUT PDS AB 1 TP1 96 (SUTURE) ×4 IMPLANT
SUT SILK 2 0 SH CR/8 (SUTURE) ×2 IMPLANT
SUT SILK 2 0 TIES 10X30 (SUTURE) ×2 IMPLANT
SUT SILK 3 0 SH CR/8 (SUTURE) ×2 IMPLANT
SUT SILK 3 0 TIES 10X30 (SUTURE) ×2 IMPLANT
SUT VIC AB 3-0 SH 18 (SUTURE) IMPLANT
SUT VIC AB 3-0 SH 27 (SUTURE) ×4
SUT VIC AB 3-0 SH 27XBRD (SUTURE) ×2 IMPLANT
TOWEL GREEN STERILE (TOWEL DISPOSABLE) ×2 IMPLANT
TRAY FOLEY MTR SLVR 16FR STAT (SET/KITS/TRAYS/PACK) IMPLANT
YANKAUER SUCT BULB TIP NO VENT (SUCTIONS) IMPLANT

## 2020-10-23 NOTE — H&P (Addendum)
NAME:  Travis Mayo, MRN:  [id number], DOB:  March 04, 1961, LOS: 0 ADMISSION DATE:  10/22/2020, CONSULTATION DATE:  10/28/2020 REFERRING MD: Darl Householder , CHIEF COMPLAINT:  Massive R MCA stroke, Vent Management   Brief History:  60 year old male with Hx of DM, smoker, who presents to the Ed 1/22 with sepsis ( perforated Bowel) , presenting as a code Stroke ( R MCA Stroke). Intubated in ED for Airway Protection. PCCM asked to admit and manage care  History of Present Illness:  60 year old male with Hx of DM, smoker, slow to heal ankle wound with skin grafting who presents to the Ed 1/22 with sepsis ( perforated Bowel) , presenting as a code Stroke ( R MCA Stroke). L hemiparesis and L facial droop.Intubated in ED for Airway Protection. PCCM asked to admit and manage care. In the ED  K was 3.4 Creat>> 3.12 AST 67 ALT 62 Alk Phos 133 HGB 12.7 WBC 12.8 PLT 136  CXR concerning for multilobar pneumonia vs atelectasis, small L effusion  MRI Brain + for massive RMCA  CT abdomen + for pneumoperitoneum   Past Medical History:   Past Medical History:  Diagnosis Date  . Arthritis   . Cataract   . Charcot's arthropathy    left  . Hypertension     Significant Hospital Events:  10/27/2020 Admission  Consults:  1/22 Surgery 1/22 Neuro 1/22 CCM  Procedures:  1/22 Intubation  Significant Diagnostic Tests:  1/22 CT abdomen Pelvis Extensive pneumoperitoneum within the central upper abdomen and right upper quadrant. Based on location of free gas as well as a gas/fluid collection, favor perforated duodenal ulcer over gastric ulcer or colonic perforation. Surgical consultation is recommended. Left inguinal hernia containing a portion of sigmoid colon. No bowel obstruction or strangulation. Small hiatal hernia, containing a small amount of pneumoperitoneum. Gallbladder sludge without cholelithiasis or cholecystitis.  10/22/2020 MRI Brain Sequela of acute right MCA territory infarct with 1.1  cm frontal hemorrhagic component.  Smaller infarcts involving the left frontal and occipital regions.  Right perisylvian and left frontal petechial hemorrhages.  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  1/22 MRI Angio Brain    Micro Data:    Antimicrobials:  Vanc 1/22>> Cefipime 1/22>> Flagyl 1/22>>   Interim History / Subjective:  Sedated and intubated Surgery evaluating for repair of perforated Viscous Right sided movement noted L hemiparesis  Objective   Blood pressure 116/83, pulse (!) 131, temperature 98.5 F (36.9 C), temperature source Axillary, resp. rate (!) 28, height _0  (1.778 m), weight 97 kg, SpO2 99 %.    Vent Mode: PRVC FiO2 (%):  [100 %] 100 % Set Rate:  [16 bmp] 16 bmp Vt Set:  [580 mL] 580 mL PEEP:  [5 cmH20] 5 cmH20 Plateau Pressure:  [16 cmH20] 16 cmH20  No intake or output data in the 24 hours ending 10/19/2020 1832 Filed Weights   11/01/2020 1739  Weight: 97 kg    Examination: General: sedated , intubated, In NAD HENT: ETT secure and intact, No LAD, No JVD Lungs: Bilateral chest excursion, Coarse throughout, diminished per bases Cardiovascular: S1, S2, RRR, Tachy Abdomen: Soft, Tender to palpation, Distended, BD diminished Extremities: Right ankle wound, open to air,mottling, L hemiparesis Neuro: L hemiparesis, sedated and intubated GU: NA  Resolved Hospital Problem list     Assessment & Plan:  Respiratory Failure  In setting of Pneumoperitoneum/ Massive MCA Stroke Pneumonia vs atelectasis per CT Plan ABG  in 1 hour Wean FiO2 and PEEP as able No SBT for now  Full support CXR in AM Propofol and Fentanyl for sedation  Pneumoperitoneum/ Surgical Discussion with son regarding goals of care Plan Surgery and Neuro to meet with son for goals of care and decision regarding surgery ABX as ordered Trend WBC and fever curve Blood Cx>> Follow Micro  AKI on Chronic KD Plan IVF LR at  150 Trend BMET Avoid nephrotoxic medications  Maintain renal perfusion  MCA Stroke ( Right )  Per Neuro Propofol for BP control MRI/ MRA pending Code Stroke called  Sepsis  Code Sepsis called Plan ABX as ordered Trend WBC and fever curve Blood Cx>> Follow Micro IVF Trend lactate  DM Plan CBG SSI  Left  Foot Wound Plan Wound consult in am   Best practice (evaluated daily)  Diet: NPO Pain/Anxiety/Delirium protocol (if indicated): Fentanyl / Propofol VAP protocol (if indicated): initiated DVT prophylaxis: SCD GI prophylaxis: Protonix Glucose control: SSI Mobility: BR Disposition:ICU  Goals of Care:  Last date of multidisciplinary goals of care discussion:Pending tonight when son arrives Family and staff present: pending Summary of discussion: Needs goals of care Follow up goals of care discussion due: Pending Code Status: Full  Labs   CBC: Recent Labs  Lab 10/22/2020 1617  WBC 12.8*  NEUTROABS 12.1*  HGB 12.7*  13.6  HCT 39.6  40.0  MCV 85.7  PLT 136*    Basic Metabolic Panel: Recent Labs  Lab 10/05/2020 1617  NA 138  138  K 3.4*  3.4*  CL 108  112*  CO2 12*  GLUCOSE 165*  155*  BUN 61*  54*  CREATININE 3.12*  2.90*  CALCIUM 8.5*   GFR: Estimated Creatinine Clearance: 32.4 mL/min (A) (by C-G formula based on SCr of 2.9 mg/dL (H)). Recent Labs  Lab 10/18/2020 1617  WBC 12.8*    Liver Function Tests: Recent Labs  Lab 10/10/2020 1617  AST 67*  ALT 62*  ALKPHOS 133*  BILITOT 1.1  PROT 5.8*  ALBUMIN 2.2*   No results for input(s): LIPASE, AMYLASE in the last 168 hours. No results for input(s): AMMONIA in the last 168 hours.  ABG    Component Value Date/Time   TCO2 14 (L) 10/22/2020 1617     Coagulation Profile: Recent Labs  Lab 10/19/2020 1617  INR 1.2    Cardiac Enzymes: No results for input(s): CKTOTAL, CKMB, CKMBINDEX, TROPONINI in the last 168 hours.  HbA1C: No results found for: HGBA1C  CBG: No results for  input(s): GLUCAP in the last 168 hours.  Review of Systems:   Unable as intubated and sedated  Past Medical History:  He,  has a past medical history of Arthritis, Cataract, Charcot's arthropathy, and Hypertension.   Surgical History:   Past Surgical History:  Procedure Laterality Date  . ANKLE FUSION Left 03/26/2020   Procedure: LEFT TIBIOCALCANEAL FUSION;  Surgeon: Newt Minion, MD;  Location: [facility];  Service: Orthopedics;  Laterality: Left;  . CATARACT EXTRACTION Left   . HERNIA REPAIR Right   . I & D EXTREMITY Left 05/12/2020   Procedure: DEBRIDEMENT LEFT ANKLE & REMOVAL DEEP HARDWARE;  Surgeon: Newt Minion, MD;  Location: [facility];  Service: Orthopedics;  Laterality: Left;  . I & D EXTREMITY Left 05/21/2020   Procedure: REPEAT IRRIGATION AND DEBRIDEMENT LEFT ANKLE;  Surgeon: Newt Minion, MD;  Location: [facility];  Service: Orthopedics;  Laterality: Left;  . KNEE SURGERY Right    cartilage  .  SKIN SPLIT GRAFT Left 08/04/2020   Procedure: PREPARE AND APPLY SPLIT THICKNESS SKIN GRAFT LEFT ANKLE;  Surgeon: Newt Minion, MD;  Location: [facility];  Service: Orthopedics;  Laterality: Left;     Social History:   reports that he has been smoking cigars. He has never used smokeless tobacco. He reports current alcohol use of about 20.0 standard drinks of alcohol per week. He reports that he does not use drugs.   Family History:  His family history is not on file.   Allergies No Known Allergies   Home Medications  Prior to Admission medications   Medication Sig Start Date End Date Taking? Authorizing Provider  Ascorbic Acid (VITAMIN C) 1000 MG tablet Take 1,000 mg by mouth daily.    [provider]  COLLAGEN PO Take 1-2 Scoops by mouth daily.    [provider]  ibuprofen (ADVIL,MOTRIN) 200 MG tablet Take 800 mg by mouth every 8 (eight) hours as needed (inflammation/pain).     [provider]  lisinopril (ZESTRIL) 10 MG tablet Take 1 tablet (10 mg total) by  mouth daily. 07/01/20   Persons, Bevely Palmer, [place]  Omega-3 Fatty Acids (FISH OIL PO) Take 1 capsule by mouth daily.    [provider]  oxyCODONE-acetaminophen (PERCOCET) 5-325 MG tablet Take 1 tablet by mouth every 4 (four) hours as needed. 08/04/20 08/04/21  Persons, Bevely Palmer, PA  Protein POWD Take 1 Scoop by mouth daily.    [provider]     Critical care time: 7 minutes   Magdalen Spatz, MSN, AGACNP-BC [place] for personal pager 10/05/2020 7:05 PM

## 2020-10-23 NOTE — Anesthesia Procedure Notes (Signed)
Arterial Line Insertion Start/EndFebruary 03, 2022 11:25 PM, 2020-11-04 11:26 PM Performed by: Zollie Scale, CRNA, CRNA  Patient location: OR. Emergency situation Patient sedated Right, radial was placed Catheter size: 20 G Hand hygiene performed  and Seldinger technique used Allen's test indicative of satisfactory collateral circulation Attempts: 1 Procedure performed without using ultrasound guided technique. Following insertion, dressing applied and Biopatch. Post procedure assessment: normal  Patient tolerated the procedure well with no immediate complications.

## 2020-10-23 NOTE — ED Provider Notes (Signed)
[facility] EMERGENCY DEPARTMENT Provider Note   CSN: [id number] Arrival date & time: 10/07/2020  1608  An emergency department physician performed an initial assessment on this suspected stroke patient at 1609.  History Chief Complaint  Patient presents with  . Code Stroke    Travis Mayo is a 60 y.o. male hx of HTN, here presenting with code stroke. Patient has Charcot's foot and had surgery done by Dr. Lajoyce Corners 2 months ago. Patient's wound has not been healing well. Patient called his orthopedic doctor 2 days ago because he just not feeling well and has diffuse weakness and worse leg pain. Patient's friend checked on the patient today and patient was noted to be altered and confused. EMS noted left facial droop and left flaccid paralysis. Patient was also aphasic. Patient is unable to give me any history. Code stroke was activated by EMS.  The history is provided by the EMS personnel.  Level V caveat- AMS     Past Medical History:  Diagnosis Date  . Arthritis   . Cataract   . Charcot's arthropathy    left  . Hypertension     Patient Active Problem List   Diagnosis Date Noted  . Wound of ankle, left, sequela   . MSSA (methicillin susceptible Staphylococcus aureus) infection 05/18/2020  . Cardiac murmur due to mitral valve disorder 05/18/2020  . Abscess of bursa of left ankle 05/12/2020  . Hardware complicating wound infection (HCC)   . Left ankle pain 03/26/2020  . Charcot's joint, left ankle and foot     Past Surgical History:  Procedure Laterality Date  . ANKLE FUSION Left 03/26/2020   Procedure: LEFT TIBIOCALCANEAL FUSION;  Surgeon: Nadara Mustard, MD;  Location: [facility] OR;  Service: Orthopedics;  Laterality: Left;  . CATARACT EXTRACTION Left   . HERNIA REPAIR Right   . I & D EXTREMITY Left 05/12/2020   Procedure: DEBRIDEMENT LEFT ANKLE & REMOVAL DEEP HARDWARE;  Surgeon: Nadara Mustard, MD;  Location: [facility] OR;  Service: Orthopedics;  Laterality: Left;  . I  & D EXTREMITY Left 05/21/2020   Procedure: REPEAT IRRIGATION AND DEBRIDEMENT LEFT ANKLE;  Surgeon: Nadara Mustard, MD;  Location: [facility] OR;  Service: Orthopedics;  Laterality: Left;  . KNEE SURGERY Right    cartilage  . SKIN SPLIT GRAFT Left 08/04/2020   Procedure: PREPARE AND APPLY SPLIT THICKNESS SKIN GRAFT LEFT ANKLE;  Surgeon: Nadara Mustard, MD;  Location: [facility] OR;  Service: Orthopedics;  Laterality: Left;       No family history on file.  Social History   Tobacco Use  . Smoking status: Current Some Day Smoker    Types: Cigars  . Smokeless tobacco: Never Used  . Tobacco comment: random  Vaping Use  . Vaping Use: Never used  Substance Use Topics  . Alcohol use: Yes    Alcohol/week: 20.0 standard drinks    Types: 15 Cans of beer, 5 Glasses of wine per week  . Drug use: No    Home Medications Prior to Admission medications   Medication Sig Start Date End Date Taking? Authorizing Provider  Ascorbic Acid (VITAMIN C) 1000 MG tablet Take 1,000 mg by mouth daily.    [provider]  COLLAGEN PO Take 1-2 Scoops by mouth daily.    [provider]  ibuprofen (ADVIL,MOTRIN) 200 MG tablet Take 800 mg by mouth every 8 (eight) hours as needed (inflammation/pain).     [provider]  lisinopril (ZESTRIL) 10 MG tablet  Take 1 tablet (10 mg total) by mouth daily. 07/01/20   Persons, [place], [place]  Omega-3 Fatty Acids (FISH OIL PO) Take 1 capsule by mouth daily.    [provider]  oxyCODONE-acetaminophen (PERCOCET) 5-325 MG tablet Take 1 tablet by mouth every 4 (four) hours as needed. 08/04/20 08/04/21  Persons, [place], [place]  Protein POWD Take 1 Scoop by mouth daily.    [provider]    Allergies    Patient has no known allergies.  Review of Systems   Review of Systems  Unable to perform ROS: Mental status change  Gastrointestinal: Positive for abdominal distention.  Psychiatric/Behavioral: Positive for confusion.  All other systems reviewed  and are negative.   Physical Exam Updated Vital Signs BP 116/83   Pulse (!) 131   Temp 98.5 F (36.9 C) (Axillary)   Resp (!) 28   Ht 5\' 10"  (1.778 m)   Wt 97 kg   SpO2 99%   BMI 30.68 kg/m   Physical Exam Vitals and nursing note reviewed.  Constitutional:      Comments: Aphasic and difficult to arouse. Not answering questions. Only responsive to pain  HENT:     Head: Normocephalic.     Nose: Nose normal.     Mouth/Throat:     Mouth: Mucous membranes are dry.  Eyes:     Extraocular Movements: Extraocular movements intact.     Pupils: Pupils are equal, round, and reactive to light.  Cardiovascular:     Rate and Rhythm: Regular rhythm. Tachycardia present.     Pulses: Normal pulses.  Pulmonary:     Comments: Crackles bilateral bases Abdominal:     Comments: Distended and wincing on exam and diffusely tender  Musculoskeletal:     Cervical back: Normal range of motion.     Comments: Left Charcot's foot  Skin:    General: Skin is warm.     Capillary Refill: Capillary refill takes less than 2 seconds.  Neurological:     Comments: Patient is aphasic and has left facial droop and left-sided paralysis. Patient is only responsive to pain  Psychiatric:     Comments: Unable      ED Results / Procedures / Treatments   Labs (all labs ordered are listed, but only abnormal results are displayed) Labs Reviewed  CBC - Abnormal; Notable for the following components:      Result Value   WBC 12.8 (*)    Hemoglobin 12.7 (*)    RDW 16.9 (*)    Platelets 136 (*)    All other components within normal limits  DIFFERENTIAL - Abnormal; Notable for the following components:   Neutro Abs 12.1 (*)    Lymphs Abs 0.1 (*)    Abs Immature Granulocytes 0.15 (*)    All other components within normal limits  COMPREHENSIVE METABOLIC PANEL - Abnormal; Notable for the following components:   Potassium 3.4 (*)    CO2 12 (*)    Glucose, Bld 165 (*)    BUN 61 (*)    Creatinine, Ser 3.12 (*)     Calcium 8.5 (*)    Total Protein 5.8 (*)    Albumin 2.2 (*)    AST 67 (*)    ALT 62 (*)    Alkaline Phosphatase 133 (*)    GFR, Estimated 22 (*)    Anion gap 18 (*)    All other components within normal limits  I-STAT CHEM 8, ED - Abnormal; Notable for the following  components:   Potassium 3.4 (*)    Chloride 112 (*)    BUN 54 (*)    Creatinine, Ser 2.90 (*)    Glucose, Bld 155 (*)    Calcium, Ion 1.08 (*)    TCO2 14 (*)    All other components within normal limits  SARS CORONAVIRUS 2 BY RT PCR (HOSPITAL ORDER, PERFORMED IN [facility])  CULTURE, BLOOD (ROUTINE X 2)  CULTURE, BLOOD (ROUTINE X 2)  ETHANOL  PROTIME-INR  APTT  RAPID URINE DRUG SCREEN, HOSP PERFORMED  URINALYSIS, ROUTINE W REFLEX MICROSCOPIC  LACTIC ACID, PLASMA  LACTIC ACID, PLASMA    EKG None  Radiology CT ABDOMEN PELVIS WO CONTRAST  Result Date: 10/20/2020 CLINICAL DATA:  Abdominal distension, nausea and vomiting EXAM: CT ABDOMEN AND PELVIS WITHOUT CONTRAST TECHNIQUE: Multidetector CT imaging of the abdomen and pelvis was performed following the standard protocol without IV contrast. COMPARISON:  None. FINDINGS: Lower chest: Hypoventilatory changes are seen at the lung bases. No effusion or pneumothorax. There is a small hiatal hernia. Within the hernia there is a small amount of free gas consistent with pneumoperitoneum seen elsewhere within the abdomen. Hepatobiliary: High density material within the gallbladder compatible with sludge. No evidence of calcified gallstones or cholecystitis. The liver is unremarkable. Pancreas: Unremarkable. No pancreatic ductal dilatation or surrounding inflammatory changes. Spleen: Normal in size without focal abnormality. Adrenals/Urinary Tract: No urinary tract calculi or obstructive uropathy. Bladder is unremarkable. The adrenals are normal. Stomach/Bowel: No bowel obstruction or ileus. Diverticulosis of the sigmoid colon without diverticulitis. There is  pneumoperitoneum within the central upper abdomen, with free gas surrounding the gastric antrum, proximal duodenum, and proximal transverse colon. There is a gas and fluid collection within the right upper quadrant, measuring approximately 4.9 x 4.3 cm reference image 40/3. Findings could be related to perforated gastric/duodenal ulcer or transverse colonic perforation. Surgical consultation recommended. There is a normal appendix right lower quadrant. Vascular/Lymphatic: Aortic atherosclerosis. No enlarged abdominal or pelvic lymph nodes. Reproductive: Prostate is unremarkable. Other: Pneumoperitoneum throughout the upper abdomen. No significant ascites. There is a left inguinal hernia containing a portion of sigmoid colon, with no evidence of bowel obstruction or incarceration. Musculoskeletal: No acute or destructive bony lesions. Reconstructed images demonstrate no additional findings. IMPRESSION: 1. Extensive pneumoperitoneum within the central upper abdomen and right upper quadrant. Based on location of free gas as well as a gas/fluid collection, favor perforated duodenal ulcer over gastric ulcer or colonic perforation. Surgical consultation is recommended. 2. Left inguinal hernia containing a portion of sigmoid colon. No bowel obstruction or strangulation. 3. Small hiatal hernia, containing a small amount of pneumoperitoneum. 4. Gallbladder sludge without cholelithiasis or cholecystitis. Critical Value/emergent results were called by telephone at the time of interpretation on 10/03/2020 at 5:00 pm to provider Joannie Medine , who verbally acknowledged these results. Electronically Signed   By: Sharlet Salina M.D.   On: 10/12/2020 17:01   MR BRAIN WO CONTRAST  Result Date: 10/29/2020 CLINICAL DATA:  Neuro deficit, acute, stroke suspected EXAM: MRI HEAD WITHOUT CONTRAST TECHNIQUE: Multiplanar, multiecho pulse sequences of the brain and surrounding structures were obtained without intravenous contrast. COMPARISON:   10/19/2020. FINDINGS: Please note motion artifact and limited sequence acquisition limits evaluation. MRI HEAD FINDINGS Brain: Sequela of acute right MCA territory infarct with 1.1 cm right frontal hemorrhagic component. Additional smaller infarcts are seen within the left frontal and occipital regions. Minimal SWI signal dropout along the right sylvian fissure and involving the left frontal lobe  may reflect petechial hemorrhages. Mild cerebral atrophy with ex vacuo dilatation. No midline shift or ventriculomegaly. 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 Vascular: Please see MRA head. Skull and upper cervical spine: Normal marrow signal. Sinuses/Orbits: No acute orbital finding. Other: None. IMPRESSION: Sequela of acute right MCA territory infarct with 1.1 cm frontal hemorrhagic component. Smaller infarcts involving the left frontal and occipital regions. Right perisylvian and left frontal petechial hemorrhages. 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 Motion artifact and limited sequence acquisition limits evaluation. These results were called by telephone at the time of interpretation on 10-31-2020 at 6:00 pm to provider Dr. Chaney Malling, Who verbally acknowledged these results. Electronically Signed   By: Stana Bunting M.D.   On: 2020-10-31 18:06   DG Chest Port 1 View  Result Date: Oct 31, 2020 CLINICAL DATA:  60 year old male with possible sepsis. Status post intubation. EXAM: PORTABLE CHEST 1 VIEW COMPARISON:  No priors. FINDINGS: An endotracheal tube is in place with tip 3.1 cm above the carina. Nasogastric tube extending into the proximal stomach, with side port in the distal third of the esophagus. Lung volumes are low. Patchy ill-defined airspace disease and areas of interstitial prominence in the lungs bilaterally, most evident throughout the left mid to  lower lung. Small left pleural effusion. No right pleural effusion. No pneumothorax. Heart size is normal. The patient is rotated to the left on today's exam, resulting in distortion of the mediastinal contours and reduced diagnostic sensitivity and specificity for mediastinal pathology. IMPRESSION: 1. Support apparatus, as above. 2. The appearance of the chest is concerning for multilobar bilateral pneumonia. 3. Small left pleural effusion. Electronically Signed   By: Trudie Reed M.D.   On: 2020/10/31 18:16   CT HEAD CODE STROKE WO CONTRAST  Result Date: Oct 31, 2020 CLINICAL DATA:  Code stroke.  Neuro deficit, acute, stroke suspected EXAM: CT HEAD WITHOUT CONTRAST TECHNIQUE: Contiguous axial images were obtained from the base of the skull through the vertex without intravenous contrast. COMPARISON:  None. FINDINGS: Brain: 1.0 cm right frontal hemorrhagic focus with mild peripheral edema. Ill-defined hypodense region involving the inferior right frontal lobe may be artifactual. No mass lesion. No midline shift, ventriculomegaly or extra-axial fluid collection. Vascular: No hyperdense vessel or unexpected calcification. Skull: No acute finding. Sinuses/Orbits: No acute orbital finding. Minimal maxillary sinus mucosal thickening. Other: None. ASPECTS [facility] Stroke Program Early CT Score) - Ganglionic level infarction (caudate, lentiform nuclei, internal capsule, insula, M1-M3 cortex): 7 - Supraganglionic infarction (M4-M6 cortex): 3 Total score (0-10 with 10 being normal): 10 IMPRESSION: 1. 1.0 cm right frontal hemorrhagic focus. Consider MRI to exclude underlying lesion. 2. Ill-defined inferior right frontal hypodensity may reflect artifact versus subacute insult. 3. ASPECTS is 10 Code stroke imaging results were communicated on October 31, 2020 at 4:38 pm to provider Dr. Amada Jupiter via telephone, who verbally acknowledged these results. Electronically Signed   By: Stana Bunting M.D.   On: 2020/10/31 16:46     Procedures Procedures (including critical care time)  CRITICAL CARE Performed by: Richardean Canal   Total critical care time: 40 minutes  Critical care time was exclusive of separately billable procedures and treating other patients.  Critical care was necessary to treat or prevent imminent or life-threatening deterioration.  Critical care was time spent personally by me on the following activities: development of treatment plan with patient and/or surrogate as well as nursing,  discussions with consultants, evaluation of patient's response to treatment, examination of patient, obtaining history from patient or surrogate, ordering and performing treatments and interventions, ordering and review of laboratory studies, ordering and review of radiographic studies, pulse oximetry and re-evaluation of patient's condition.  INTUBATION Performed by: Richardean Canal  Required items: required blood products, implants, devices, and special equipment available Patient identity confirmed: provided demographic data and hospital-assigned identification number Time out: Immediately prior to procedure a "time out" was called to verify the correct patient, procedure, equipment, support staff and site/side marked as required.  Indications:   Intubation method: Glidescope Laryngoscopy   Preoxygenation: BVM  Sedatives: 20 mg Etomidate Paralytic: 100 mg Succinylcholine  Tube Size: 8.0 cuffed  Post-procedure assessment: chest rise and ETCO2 monitor Breath sounds: equal and absent over the epigastrium Tube secured with: ETT holder Chest x-ray interpreted by radiologist and me.  Chest x-ray findings: endotracheal tube in appropriate position  Patient tolerated the procedure well with no immediate complications.      Medications Ordered in ED Medications  lactated ringers infusion (has no administration in time range)  sodium chloride 0.9 % bolus 1,000 mL (1,000 mLs Intravenous New Bag/Given  10/05/2020 1748)    And  sodium chloride 0.9 % bolus 1,000 mL (has no administration in time range)    And  sodium chloride 0.9 % bolus 1,000 mL (has no administration in time range)  metroNIDAZOLE (FLAGYL) IVPB 500 mg (has no administration in time range)  etomidate (AMIDATE) injection 20 mg (0 mg Intravenous Hold 10/19/2020 1741)  succinylcholine (ANECTINE) injection 100 mg (0 mg Intravenous Hold 10/21/2020 1742)  propofol (DIPRIVAN) 1000 MG/100ML infusion (60 mcg/kg/min  97 kg Intravenous Rate/Dose Change 10/21/2020 1808)  ceFEPIme (MAXIPIME) 2 g in sodium chloride 0.9 % 100 mL IVPB (has no administration in time range)  sodium chloride 0.9 % bolus 1,000 mL (1,000 mLs Intravenous New Bag/Given 10/28/2020 1733)  ceFEPIme (MAXIPIME) 2 g in sodium chloride 0.9 % 100 mL IVPB (2 g Intravenous New Bag/Given 10/22/2020 1756)  etomidate (AMIDATE) injection (20 mg Intravenous Given 10/14/2020 1739)  succinylcholine (ANECTINE) injection (100 mg Intravenous Given 10/07/2020 1739)    ED Course  I have reviewed the triage vital signs and the nursing notes.  Pertinent labs & imaging results that were available during my care of the patient were reviewed by me and considered in my medical decision making (see chart for details).    MDM Rules/Calculators/A&P                          Travis Mayo is a 60 y.o. male here presenting with altered mental status. Patient has aphasia and left sided paralysis. Patient is unable to give any history. Patient also has abdominal tenderness as well. Code stroke activated by EMS. Dr. Amada Jupiter saw patient on arrival. He recommend CT perfusion study but patient has AKI so MRI and MRA will be performed  6:30 PM CT showed pneumoperitoneum. I talked to Dr. Fredricka Bonine, surgeon on call. I also talked to [place], [place] in [place]. He is coming to see patient in the next few hours. He confirmed that patient is full code and he wants everything done as possible.  Surgeon to evaluate patient.  She  recommend that we talk to the medical ICU  6:53 PM ICU will be admitting.  Neurology will also be following patient.  Code sepsis was initiated and patient given 30 cc/kg bolus and IV antibiotics.  His  condition remains critical and his prognosis remains poor. Will admit for sepsis from pneumoperitoneum, large stroke.    Final Clinical Impression(s) / ED Diagnoses Final diagnoses:  None    Rx / DC Orders ED Discharge Orders    None       Charlynne PanderYao, Jermiya Reichl Hsienta, MD 10/22/2020 [phone]

## 2020-10-23 NOTE — Progress Notes (Addendum)
Pharmacy Antibiotic Note  Travis Mayo is a 60 y.o. male admitted on 10/16/2020 with sepsis, presents as code stroke also with perforated bowel.  Pharmacy has been consulted for cefepime and vancomycin dosing.  Flagyl per MD  Plan: Vancomycin 2000 mg IV x 1, then variable dosing due to unstable renal function Cefepime 2g IV every 24 hours Monitor renal function, clinical progression and LOT Vancomycin levels as needed  Height: 5\' 10"  (177.8 cm) Weight: 97 kg (213 lb 13.5 oz) IBW/kg (Calculated) : 73  Temp (24hrs), Avg:98.5 F (36.9 C), Min:98.5 F (36.9 C), Max:98.5 F (36.9 C)  Recent Labs  Lab 10/29/2020 1617  WBC 12.8*  CREATININE 3.12*  2.90*    Estimated Creatinine Clearance: 32.4 mL/min (A) (by C-G formula based on SCr of 2.9 mg/dL (H)).    No Known Allergies  10/25/20, PharmD Clinical Pharmacist ED Pharmacist Phone # [phone] 10/18/2020 6:03 PM

## 2020-10-23 NOTE — Progress Notes (Signed)
General Surgery  I had a discussion with the patient's son Kohle Winner at the patient's bedside (prior to this I spoke with the on call neurologist regarding patient's neurologic prognosis). I explained that the patient has a significant amount of pneumoperitoneum with stranding around the duodenum and stomach. This is most likely secondary to a perforated ulcer, which can occasionally seal with antibiotics but given the amount of pneumoperitoneum and wide distribution throughout the upper abdomen I think it is unlikely in this case. Abdomen is mildly distended but soft, but the patient is sedated. He has been moving his right side.   Although patient is expected to have significant long-term neurological deficits, neurology team agrees that there is no absolute contraindication to surgery. Surgical intervention will be high risk in the setting of recent MCA stroke, but if patient is not explored to obtain source control I think he is at significant risk of progressing to septic shock and death, which I explained to the patient's son. Unless he thinks his father would not want any aggressive interventions in light of his neurologic prognosis, I recommended that we proceed with abdominal exploration to prevent further clinical deterioration. Alternatively we could trial nonoperative management with antibiotics but if the patient deteriorates he may not recover at that point even with surgery. Huston Foley feels that his father would want to proceed with whatever is needed to save his life at this point and he would like to proceed with surgery. Informed consent was obtained for exploratory laparotomy. I will also plan to place a feeding tube in case of postoperative leak as well as for possible dysphagia given stroke. Proceed to OR tonight.  Sophronia Simas, MD [facility] General, Hepatobiliary and Pancreatic Surgery November 20, 2020 9:54 PM

## 2020-10-23 NOTE — Anesthesia Preprocedure Evaluation (Addendum)
Anesthesia Evaluation  Patient identified by MRN, date of birth, ID band Patient unresponsive    Reviewed: Allergy & Precautions, Patient's Chart, lab work & pertinent test results, Unable to perform ROS - Chart review only  History of Anesthesia Complications Negative for: history of anesthetic complications  Airway Mallampati: Intubated       Dental   Pulmonary Current Smoker (cigars),   Intubated    breath sounds clear to auscultation   + intubated    Cardiovascular hypertension (uncontrolled, noncompliant),  Rhythm:Regular Rate:Tachycardia   '21 TTE - EF 60 to 65%. Moderate left ventricular hypertrophy. Left atrial size was mildly dilated. Mild aortic valve stenosis. There is mild dilatation of the aortic root measuring 40 mm.     Neuro/Psych  Left MCA territory CVA with hemorrhagic conversion, last known well time ~6pm yesterday  CVA, Residual Symptoms    GI/Hepatic (+)     substance abuse  alcohol use,   Endo/Other   K 3.2 Ca 8.5 Possible undiagnosed DM   Renal/GU ARFRenal disease     Musculoskeletal  (+) Arthritis ,   Abdominal   Peds  Hematology  (+) anemia ,   Anesthesia Other Findings Covid test negative   Reproductive/Obstetrics                            Anesthesia Physical Anesthesia Plan  ASA: V and emergent  Anesthesia Plan: General   Post-op Pain Management:    Induction: Inhalational  PONV Risk Score and Plan: 1 and Treatment may vary due to age or medical condition  Airway Management Planned: Oral ETT  Additional Equipment: Arterial line, CVP and Ultrasound Guidance Line Placement  Intra-op Plan:   Post-operative Plan: Post-operative intubation/ventilation  Informed Consent:     History available from chart only and Only emergency history available  Plan Discussed with: CRNA and Anesthesiologist  Anesthesia Plan Comments:         Anesthesia Quick Evaluation

## 2020-10-23 NOTE — Progress Notes (Signed)
eLink Physician-Brief Progress Note Patient Name: Travis Mayo DOB: 1960-12-11 MRN: [id number]   Date of Service  10/26/2020  HPI/Events of Note  ABG on 100%/PRVC 16 (patient over breathing the ventilator set rate at 25)/TV 580/ P 5 = 7.337/24.6/241. Last lactic acid = 2.9.   eICU Interventions  Plan: 1. Increase PRVC rate to 25. 2. NaHCO3 IV infusion to run at 50 mL/hour.  3. Repeat ABG at 5 AM.  4. Repeat Lactic Acid at 10:30 PM and 1:30 AM. 5. Phenylephrine IV infusion via PIV. Titrate to MAP >= 65..     Intervention Category Major Interventions: Acid-Base disturbance - evaluation and management;Respiratory failure - evaluation and management;Hypotension - evaluation and management  Lenell Antu 10/19/2020, 9:10 PM

## 2020-10-23 NOTE — Progress Notes (Signed)
Sent secure chat to RN requesting repeat LA per code sepsis protocol.

## 2020-10-23 NOTE — Progress Notes (Signed)
Code Sepsis initiated @ 1723 PM. Elink following.

## 2020-10-23 NOTE — Progress Notes (Signed)
eLink Physician-Brief Progress Note Patient Name: Travis Mayo DOB: [birth year] MRN: [id number]   Date of Service  11/06/20  HPI/Events of Note  Hypotension - BP = 72/63 with MAP = 68. HR = 107. Lactic Acid = 3.1. Hgb = 11.6. No CVL or CVP.  eICU Interventions  Plan: 1. Bolus with LR 1 liter IV over 1 hour now.  2. Titrate Phenylephrine IV infusion to keep MAP >= 65.      Intervention Category Major Interventions: Acid-Base disturbance - evaluation and management;Hypotension - evaluation and management  Lenell Antu 11/06/2020, 10:39 PM

## 2020-10-23 NOTE — Progress Notes (Signed)
Patient intubated by ED MD without complications.  Bilateral breath sounds auscultated.  Positive color change noted.  Sats and vitals are stable.  Chest xray ordered for pending tube placement.  Will continue to monitor.

## 2020-10-23 NOTE — Consult Note (Addendum)
Neurology Code Stroke Consult  CC: CODE STROKE  History is obtained from: EMS  HPI: Travis Mayo is a 60 y.o. male with a PMHx of Charcot's deformity left foot and HTN. Per EMS, patient was last seen well at 1800 hrs 10/22/20 per friend. Patient was texting earlier today with his friend but the friend did not actually see the patient until 1500 or so today and found the patient to be altered. EMS was called at 1519 hrs and patient was found to be lethargic, aphasic, and not following commands and not moving the left side. Pt's respirations were labored and he was very tachycardic. CODE STROKE was called by EMS with concern for sepsis as well.   At the bridge, patient's airway was cleared by ED MD. Patient was able to say his name and was not verbally responsive otherwise, nor was he following commands. He was taken urgently to CT scan where he was found to have ? calcification vs bleeding. Pt's creatinine was 3 so a CTA head and neck could not be performed. Dr. Amada Jupiter spoke with radiologist and it was decided to take patient stat for MRI/MRA brain without contrast.   In CT, patient continued to be lethargic and not answering questions. His LUE flaccid. LLE withdraws to pain but no purposeful movement. He moves his RUE/RLE spontaneously. He did perform right grip testing but no other muscle strength.   Pt then taken to MRI/MRA. Concern for right MCA territory stroke with hemorrhagic conversion.   NP spoke with patient's son, Huston Foley, who is on the way to hospital from [place]. Huston Foley states he would be the one to make decisions for his father. NP informed Huston Foley of the stroke and that we were still doing testing/scans to decide what to do further. Briefly, told Huston Foley he may be contacted for permission for procedure. [place]on Foley [place] but we had his permission to do anything to try and keep the patient alive. Son thinks patient is a diabetic.   LKW:  1800 hrs 10/22/20 tpa given?: No, ? Bleeding associated with ischemia on CTH.  IR Thrombectomy? No, not a candidate.  MRS unknown at this time  NIHSS:  1a Level of Conscious: 1 1b LOC Questions: 2 1c LOC Commands: 1 2 Best Gaze: 2 3 Visual: 1 4 Facial Palsy: 1 5a Motor Arm - left: 3 5b Motor Arm - Right: 0 6a Motor Leg - Left: 3 6b Motor Leg - Right: 0 7 Limb Ataxia: 0 8 Sensory: 0 9 Best Language: 1 10 Dysarthria: 0 11 Extinct. and Inatten: 0 TOTAL: 15  ROS:  Unable to assess due to altered mental status.   Past Medical History:  Diagnosis Date  . Arthritis   . Cataract   . Charcot's arthropathy    left  . Hypertension    FMHx: unable to illicit due to AMS  Social History:  reports that he has been smoking cigars. He has never used smokeless tobacco. He reports current alcohol use of about 20.0 standard drinks of alcohol per week. He reports that he does not use drugs. Son states patient drinks at least 8 beers a day for many years.    Prior to Admission medications   Medication Sig Start Date End Date Taking? Authorizing Provider  Ascorbic Acid (VITAMIN C) 1000 MG tablet Take 1,000 mg by mouth daily.    [provider]  COLLAGEN PO Take 1-2 Scoops by mouth daily.    [provider]  ibuprofen (ADVIL,MOTRIN) 200 MG tablet Take 800 mg by mouth every 8 (eight) hours as needed (inflammation/pain).     [provider]  lisinopril (ZESTRIL) 10 MG tablet Take 1 tablet (10 mg total) by mouth daily. 07/01/20   Persons, [place], [place]  Omega-3 Fatty Acids (FISH OIL PO) Take 1 capsule by mouth daily.    [provider]  oxyCODONE-acetaminophen (PERCOCET) 5-325 MG tablet Take 1 tablet by mouth every 4 (four) hours as needed. 08/04/20 08/04/21  Persons, [place], [place]  Protein POWD Take 1 Scoop by mouth daily.    [provider]    Exam: Current vital signs: BP 156/100 in field. HR 148 ST  Physical Exam  Constitutional: Appears  well-developed and well-nourished.  Psych: Lethargic.  Eyes: No scleral injection HENT: No OP obstrucion Head: Normocephalic.  Cardiovascular: Tachycardic Respiratory: increased respiratory effort, but protecting airway.  GI: Soft.  MSK: Bony deformity noted left ankle.  Skin: WDI. Left foot with open wound in a linear pattern from ankle area to top of foot.   Neuro: Mental Status: Patient is lethargic. Opens eyes to name calling. Will state name but not age.  Patient is unable to give a clear and coherent history. + aphasic to questions and commands.  Speech/Language: Slightly dysarthric when he states his name, but he does not conversate or say more than that. Seems to comprehend commands and does squeeze with right and lift right arm and leg. He named watch, but no other questions asked due to emergent situation and his AMS.   Cranial Nerves: II: Visual Fields are decreased on the left. His gaze does not cross midline. He completely ignores the left side. Pupils are equal, round, and reactive to light. III,IV, VI: EOMI without ptosis V: Sensation is grossly normal. + left facial droop.  VII: Mild left facial droop.  VIII: hearing is intact to voice IX, X: Uvula elevates symmetrically. Phonation normal.  XI: Unable to illicit.  XII: tongue is midline without atrophy or fasciculations.  Motor: Tone is normal. Bulk is normal. Right grip 5/5.  Sensory: Can not perform thorough sensory exam due to AMS. Grossly intact. Withdraws to pain LLE. No withdrawal of LUE noted.  Plantars: Toes are downgoing bilaterally. Cerebellar: Left arm flaccid. No drift right side.   Gait: deferred.   I have reviewed labs in epic and the pertinent results are:  INR   1.2    Creat 3/12 (1.22 11/21)  WBCC 12.8  Glucose 155    I have reviewed the images obtained:  NCT head showed 1. 1.0 cm right frontal hemorrhagic focus. Consider MRI to exclude underlying lesion. 2. Ill-defined inferior right  frontal hypodensity may reflect artifact versus subacute insult. 3. ASPECTS is 10  MRI brain and MRA brain- Large M MCA infarct  Pt seen by Jimmye Norman, NP and MD. Note/plan to be edited by MD as necessary.  Pager: [phone]  I have seen the patient reviewed the above note, I was present for  the evaluation and management reflected in the above note.  Assessment: Celia Gibbons is a 60 y.o. male with an unknown time of onset right MCA stroke as well as smaller stroke on the left.  I suspect this likely represents cardioembolic phenomena.  Unfortunately, he is not a candidate for any type of interventional therapy given the extensive infarct on diffusion imaging.  Concernedly, he also has a bowel perforation and will likely need surgery.  I do not feel  that surgery is absolutely contraindicated by his current ischemic infarcts, but may need discussed with the son goals of care perioperatively.  I suspect that he will have significant disability moving forward, including left-sided weakness, though his speech areas do not seem to be clearly involved and therefore he may have the ability to speak and interact, though still likely to be nursing home bound.   Plan: -Repeat head CT in the morning - PCCM consult. He may need intubation.  - Recommend TTE. - Recommend labs: HbA1c, lipid panel, TSH. - Recommend Statin if LDL > 70 - Telemetry monitoring for arrhythmia. - Recommend bedside Swallow screen. - Recommend Stroke education. - Recommend PT/OT/SLP consult. -Avoid antithrombotics given the small parenchymal hematoma   This patient is critically ill and at significant risk of neurological worsening, death and care requires constant monitoring of vital signs, hemodynamics,respiratory and cardiac monitoring, neurological assessment, discussion with family, other specialists and medical decision making of high complexity. I spent 50 minutes of neurocritical care time  in the care of   this patient. This was time spent independent of any time provided by nurse practitioner or PA.   Ritta Slot, MD [facility] [phone]  If 7pm- 7am, please page neurology on call as listed in AMION.

## 2020-10-23 NOTE — ED Notes (Signed)
Report given to rn on 2m 

## 2020-10-23 NOTE — Consult Note (Signed)
Surgical Evaluation Requesting provider: Dr. Silverio Lay  Chief Complaint: left hemiplegia/ altered mental status  HPI: History taken from chart review as patient is intubated/sedated and cannot give a history.  60 year old male with history of hypertension, alcohol abuse, tobacco abuse, Charcot joint (recent skin graft) and possibly diabetes who arrived at the [facility] around 4:30 PM this afternoon as a code stroke.  He was last seen in his normal state at 6 PM yesterday.  A friend checked on him today around 3 PM and found him to be altered.  EMS was contacted and the patient was noted to be aphasic, lethargic, with left hemiplegia and a left facial droop, tachycardic and tachypneic. He was intubated for airway protection.  CT of the head followed by MRI demonstrates a devastating left MCA territory stroke.  Per neurology, this is likely embolic and there is also a contralateral stroke; with aggressive therapy could possibly have a meaningful recovery but would likely be left with left hemiplegia as a best case scenario.  Initial labs demonstrated an acute kidney injury, for which the patient underwent a noncontrasted CT of the abdomen and pelvis.  This revealed an unexpected finding of extensive pneumoperitoneum in the central upper abdomen.   No Known Allergies  Past Medical History:  Diagnosis Date  . Arthritis   . Cataract   . Charcot's arthropathy    left  . Hypertension     Past Surgical History:  Procedure Laterality Date  . ANKLE FUSION Left 03/26/2020   Procedure: LEFT TIBIOCALCANEAL FUSION;  Surgeon: Nadara Mustard, MD;  Location: [facility] OR;  Service: Orthopedics;  Laterality: Left;  . CATARACT EXTRACTION Left   . HERNIA REPAIR Right   . I & D EXTREMITY Left 05/12/2020   Procedure: DEBRIDEMENT LEFT ANKLE & REMOVAL DEEP HARDWARE;  Surgeon: Nadara Mustard, MD;  Location: [facility] OR;  Service: Orthopedics;  Laterality: Left;  . I & D EXTREMITY Left 05/21/2020   Procedure: REPEAT IRRIGATION AND  DEBRIDEMENT LEFT ANKLE;  Surgeon: Nadara Mustard, MD;  Location: [facility] OR;  Service: Orthopedics;  Laterality: Left;  . KNEE SURGERY Right    cartilage  . SKIN SPLIT GRAFT Left 08/04/2020   Procedure: PREPARE AND APPLY SPLIT THICKNESS SKIN GRAFT LEFT ANKLE;  Surgeon: Nadara Mustard, MD;  Location: [facility] OR;  Service: Orthopedics;  Laterality: Left;    No family history on file.  Social History   Socioeconomic History  . Marital status: Single    Spouse name: Not on file  . Number of children: Not on file  . Years of education: Not on file  . Highest education level: Not on file  Occupational History  . Not on file  Tobacco Use  . Smoking status: Current Some Day Smoker    Types: Cigars  . Smokeless tobacco: Never Used  . Tobacco comment: random  Vaping Use  . Vaping Use: Never used  Substance and Sexual Activity  . Alcohol use: Yes    Alcohol/week: 20.0 standard drinks    Types: 15 Cans of beer, 5 Glasses of wine per week  . Drug use: No  . Sexual activity: Not on file  Other Topics Concern  . Not on file  Social History Narrative  . Not on file   Social Determinants of Health   Financial Resource Strain: Not on file  Food Insecurity: Not on file  Transportation Needs: Not on file  Physical Activity: Not on file  Stress: Not on file  Social  Connections: Not on file    No current facility-administered medications on file prior to encounter.   Current Outpatient Medications on File Prior to Encounter  Medication Sig Dispense Refill  . Ascorbic Acid (VITAMIN C) 1000 MG tablet Take 1,000 mg by mouth daily.    . COLLAGEN PO Take 1-2 Scoops by mouth daily.    Marland Kitchen ibuprofen (ADVIL,MOTRIN) 200 MG tablet Take 800 mg by mouth every 8 (eight) hours as needed (inflammation/pain).     Marland Kitchen lisinopril (ZESTRIL) 10 MG tablet Take 1 tablet (10 mg total) by mouth daily. 30 tablet 2  . Omega-3 Fatty Acids (FISH OIL PO) Take 1 capsule by mouth daily.    Marland Kitchen oxyCODONE-acetaminophen (PERCOCET)  5-325 MG tablet Take 1 tablet by mouth every 4 (four) hours as needed. 30 tablet 0  . Protein POWD Take 1 Scoop by mouth daily.      Review of Systems: a complete, 10pt review of systems was unable to be completed due to patient mental status  Physical Exam: Vitals:   10/08/2020 1808 10/06/2020 1815  BP:  (!) 149/88  Pulse: (!) 136 (!) 136  Resp: (!) 26 (!) 26  Temp:    SpO2: 100% 100%   Gen: Intubated, sedated Eyes: lids and conjunctivae normal, no icterus. Pupils equally round and reactive to light.  Neck: supple without mass or thyromegaly Chest: Chest rise equal, on vent Cardiovascular: RRR with palpable radial pulses, mild nonpitting bilateral lower extremity edema, slow healing wound along the left medial ankle without cellulitis Gastrointestinal: oft, nondistended, nontender in the setting of propofol.  Lymphatic: no lymphadenopathy in the neck or groin Muscoloskeletal: no clubbing or cyanosis of the fingers.  Cannot assess strength or mobility at this time Neuro: Intubated and sedated, moving purposefully with right arm but does not respond and has no movement of the left side Psych: Unable to assess Skin: warm and dry   CBC Latest Ref Rng & Units 10/21/2020 10/05/2020 08/04/2020  WBC 4.0 - 10.5 K/uL 12.8(H) - 13.8(H)  Hemoglobin 13.0 - 17.0 g/dL 12.7(L) 13.6 12.6(L)  Hematocrit 39.0 - 52.0 % 39.6 40.0 41.7  Platelets 150 - 400 K/uL 136(L) - 374    CMP Latest Ref Rng & Units 10/14/2020 10/15/2020 08/04/2020  Glucose 70 - 99 mg/dL 161(W) 960(A) 83  BUN 6 - 20 mg/dL 54(U) 98(J) 14  Creatinine 0.61 - 1.24 mg/dL 1.91(Y) 7.82(N) 5.62  Sodium 135 - 145 mmol/L 138 138 143  Potassium 3.5 - 5.1 mmol/L 3.4(L) 3.4(L) 3.7  Chloride 98 - 111 mmol/L 108 112(H) 108  CO2 22 - 32 mmol/L 12(L) - 23  Calcium 8.9 - 10.3 mg/dL 1.3(Y) - 9.1  Total Protein 6.5 - 8.1 g/dL 8.6(V) - 6.7  Total Bilirubin 0.3 - 1.2 mg/dL 1.1 - 0.6  Alkaline Phos 38 - 126 U/L 133(H) - 60  AST 15 - 41 U/L 67(H) - 27   ALT 0 - 44 U/L 62(H) - 29    Lab Results  Component Value Date   INR 1.2 10/20/2020    Imaging: CT ABDOMEN PELVIS WO CONTRAST  Result Date: 10/15/2020 CLINICAL DATA:  Abdominal distension, nausea and vomiting EXAM: CT ABDOMEN AND PELVIS WITHOUT CONTRAST TECHNIQUE: Multidetector CT imaging of the abdomen and pelvis was performed following the standard protocol without IV contrast. COMPARISON:  None. FINDINGS: Lower chest: Hypoventilatory changes are seen at the lung bases. No effusion or pneumothorax. There is a small hiatal hernia. Within the hernia there is a small amount of free  gas consistent with pneumoperitoneum seen elsewhere within the abdomen. Hepatobiliary: High density material within the gallbladder compatible with sludge. No evidence of calcified gallstones or cholecystitis. The liver is unremarkable. Pancreas: Unremarkable. No pancreatic ductal dilatation or surrounding inflammatory changes. Spleen: Normal in size without focal abnormality. Adrenals/Urinary Tract: No urinary tract calculi or obstructive uropathy. Bladder is unremarkable. The adrenals are normal. Stomach/Bowel: No bowel obstruction or ileus. Diverticulosis of the sigmoid colon without diverticulitis. There is pneumoperitoneum within the central upper abdomen, with free gas surrounding the gastric antrum, proximal duodenum, and proximal transverse colon. There is a gas and fluid collection within the right upper quadrant, measuring approximately 4.9 x 4.3 cm reference image 40/3. Findings could be related to perforated gastric/duodenal ulcer or transverse colonic perforation. Surgical consultation recommended. There is a normal appendix right lower quadrant. Vascular/Lymphatic: Aortic atherosclerosis. No enlarged abdominal or pelvic lymph nodes. Reproductive: Prostate is unremarkable. Other: Pneumoperitoneum throughout the upper abdomen. No significant ascites. There is a left inguinal hernia containing a portion of sigmoid  colon, with no evidence of bowel obstruction or incarceration. Musculoskeletal: No acute or destructive bony lesions. Reconstructed images demonstrate no additional findings. IMPRESSION: 1. Extensive pneumoperitoneum within the central upper abdomen and right upper quadrant. Based on location of free gas as well as a gas/fluid collection, favor perforated duodenal ulcer over gastric ulcer or colonic perforation. Surgical consultation is recommended. 2. Left inguinal hernia containing a portion of sigmoid colon. No bowel obstruction or strangulation. 3. Small hiatal hernia, containing a small amount of pneumoperitoneum. 4. Gallbladder sludge without cholelithiasis or cholecystitis. Critical Value/emergent results were called by telephone at the time of interpretation on 10/21/2020 at 5:00 pm to provider DAVID YAO , who verbally acknowledged these results. Electronically Signed   By: Sharlet SalinaMichael  Brown M.D.   On: 10/09/2020 17:01   MR BRAIN WO CONTRAST  Result Date: 10/07/2020 CLINICAL DATA:  Neuro deficit, acute, stroke suspected EXAM: MRI HEAD WITHOUT CONTRAST TECHNIQUE: Multiplanar, multiecho pulse sequences of the brain and surrounding structures were obtained without intravenous contrast. COMPARISON:  10/18/2020. FINDINGS: Please note motion artifact and limited sequence acquisition limits evaluation. MRI HEAD FINDINGS Brain: Sequela of acute right MCA territory infarct with 1.1 cm right frontal hemorrhagic component. Additional smaller infarcts are seen within the left frontal and occipital regions. Minimal SWI signal dropout along the right sylvian fissure and involving the left frontal lobe may reflect petechial hemorrhages. Mild cerebral atrophy with ex vacuo dilatation. No midline shift or ventriculomegaly. 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 Vascular: Please see MRA head. Skull and upper cervical spine:  Normal marrow signal. Sinuses/Orbits: No acute orbital finding. Other: None. IMPRESSION: Sequela of acute right MCA territory infarct with 1.1 cm frontal hemorrhagic component. Smaller infarcts involving the left frontal and occipital regions. Right perisylvian and left frontal petechial hemorrhages. 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 Motion artifact and limited sequence acquisition limits evaluation. These results were called by telephone at the time of interpretation on 10/02/2020 at 6:00 pm to provider Dr. Chaney Mallingavid Yao, Who verbally acknowledged these results. Electronically Signed   By: Stana Buntinghikanele  Emekauwa M.D.   On: 10/25/2020 18:06   DG Chest Port 1 View  Result Date: 10/22/2020 CLINICAL DATA:  60 year old male with possible sepsis. Status post intubation. EXAM: PORTABLE CHEST 1 VIEW COMPARISON:  No priors. FINDINGS: An endotracheal tube is in place with tip 3.1 cm  above the carina. Nasogastric tube extending into the proximal stomach, with side port in the distal third of the esophagus. Lung volumes are low. Patchy ill-defined airspace disease and areas of interstitial prominence in the lungs bilaterally, most evident throughout the left mid to lower lung. Small left pleural effusion. No right pleural effusion. No pneumothorax. Heart size is normal. The patient is rotated to the left on today's exam, resulting in distortion of the mediastinal contours and reduced diagnostic sensitivity and specificity for mediastinal pathology. IMPRESSION: 1. Support apparatus, as above. 2. The appearance of the chest is concerning for multilobar bilateral pneumonia. 3. Small left pleural effusion. Electronically Signed   By: Trudie Reedaniel  Entrikin M.D.   On: 2021/06/04 18:16   CT HEAD CODE STROKE WO CONTRAST  Result Date: 2021-09-04 CLINICAL DATA:  Code stroke.  Neuro deficit, acute, stroke suspected EXAM: CT HEAD WITHOUT CONTRAST TECHNIQUE: Contiguous axial images were  obtained from the base of the skull through the vertex without intravenous contrast. COMPARISON:  None. FINDINGS: Brain: 1.0 cm right frontal hemorrhagic focus with mild peripheral edema. Ill-defined hypodense region involving the inferior right frontal lobe may be artifactual. No mass lesion. No midline shift, ventriculomegaly or extra-axial fluid collection. Vascular: No hyperdense vessel or unexpected calcification. Skull: No acute finding. Sinuses/Orbits: No acute orbital finding. Minimal maxillary sinus mucosal thickening. Other: None. ASPECTS [facility]([place] Stroke Program Early CT Score) - Ganglionic level infarction (caudate, lentiform nuclei, internal capsule, insula, M1-M3 cortex): 7 - Supraganglionic infarction (M4-M6 cortex): 3 Total score (0-10 with 10 being normal): 10 IMPRESSION: 1. 1.0 cm right frontal hemorrhagic focus. Consider MRI to exclude underlying lesion. 2. Ill-defined inferior right frontal hypodensity may reflect artifact versus subacute insult. 3. ASPECTS is 10 Code stroke imaging results were communicated on 2021-09-04 at 4:38 pm to provider Dr. Amada JupiterKirkpatrick via telephone, who verbally acknowledged these results. Electronically Signed   By: Stana Buntinghikanele  Emekauwa M.D.   On: 2021/06/04 16:46     A/P: 60 year old man who presents with multiple life-threatening illnesses including a devastating right MCA territory CVA, perforated viscus, multilobar bilateral pneumonia on chest x-ray White count 12.8 Severe metabolic acidosis with a bicarb of 12, lactate of 3.6 Acute kidney injury with a creatinine of 3 and BUN of 61 Hypoalbuminemia 2.2  Currently his son is driving down from [place] and will need to have a meeting between neurology, our service, and critical care to discuss prognosis and goals of care.  Regarding the perforated viscus would recommend exploratory laparotomy for source control however in the setting of an acute stroke his perioperative risk of exacerbating that or of  perioperative mortality is exceptionally high.  Nonoperative management with IV antibiotics and bowel rest is not ideal; in some circumstances these perforations can wall off and self seal, perhaps leading to a drainable intra-abdominal abscess in a few days, but this also can result in the patient developing worsening septic shock which he may not be able to recover from given other active illnesses. I do think that it is reasonable and safe to wait an hour or 2 for his son to arrive so that we can have a full conversation about goals of care before proceeding to the operating room given that he does not have any evidence of septic shock and his abdominal exam while admittedly under sedation is benign.  Will discuss with my partner Dr. Freida BusmanAllen who will be covering the general surgery service tonight.  Patient Active Problem List   Diagnosis Date Noted  . Wound of  ankle, left, sequela   . MSSA (methicillin susceptible Staphylococcus aureus) infection 05/18/2020  . Cardiac murmur due to mitral valve disorder 05/18/2020  . Abscess of bursa of left ankle 05/12/2020  . Hardware complicating wound infection (HCC)   . Left ankle pain 03/26/2020  . Charcot's joint, left ankle and foot        Phylliss Blakes, MD [facility], [place]  See AMION to contact appropriate on-call provider

## 2020-10-23 NOTE — Progress Notes (Signed)
Patient transported to 2M09 without any complications.

## 2020-10-23 NOTE — ED Triage Notes (Signed)
Pt BIB [facility], L[facility] 6pm yesterday, friend checked on pt today and found him sitting in his recliner not acting normally. On EMS arrival, pt aphasic, left side flaccid and left facial droop. Tachycardic and tachypneic with EMS. Code stroke activated pta.

## 2020-10-24 ENCOUNTER — Inpatient Hospital Stay (HOSPITAL_COMMUNITY): Payer: Self-pay

## 2020-10-24 DIAGNOSIS — K668 Other specified disorders of peritoneum: Secondary | ICD-10-CM

## 2020-10-24 DIAGNOSIS — A419 Sepsis, unspecified organism: Secondary | ICD-10-CM

## 2020-10-24 DIAGNOSIS — I6389 Other cerebral infarction: Secondary | ICD-10-CM

## 2020-10-24 DIAGNOSIS — I330 Acute and subacute infective endocarditis: Secondary | ICD-10-CM

## 2020-10-24 DIAGNOSIS — I6330 Cerebral infarction due to thrombosis of unspecified cerebral artery: Secondary | ICD-10-CM | POA: Insufficient documentation

## 2020-10-24 DIAGNOSIS — M86072 Acute hematogenous osteomyelitis, left ankle and foot: Secondary | ICD-10-CM

## 2020-10-24 DIAGNOSIS — I350 Nonrheumatic aortic (valve) stenosis: Secondary | ICD-10-CM

## 2020-10-24 DIAGNOSIS — B9561 Methicillin susceptible Staphylococcus aureus infection as the cause of diseases classified elsewhere: Secondary | ICD-10-CM

## 2020-10-24 DIAGNOSIS — R7881 Bacteremia: Secondary | ICD-10-CM

## 2020-10-24 DIAGNOSIS — R6521 Severe sepsis with septic shock: Secondary | ICD-10-CM

## 2020-10-24 LAB — LACTIC ACID, PLASMA
Lactic Acid, Venous: 3 mmol/L (ref 0.5–1.9)
Lactic Acid, Venous: 4.2 mmol/L (ref 0.5–1.9)

## 2020-10-24 LAB — BLOOD CULTURE ID PANEL (REFLEXED) - BCID2

## 2020-10-24 LAB — CBC WITH DIFFERENTIAL/PLATELET
Abs Immature Granulocytes: 0.03 10*3/uL (ref 0.00–0.07)
Basophils Absolute: 0 10*3/uL (ref 0.0–0.1)
Basophils Relative: 1 %
Eosinophils Absolute: 0 10*3/uL (ref 0.0–0.5)
Eosinophils Relative: 1 %
HCT: 31.7 % — ABNORMAL LOW (ref 39.0–52.0)
Hemoglobin: 10.1 g/dL — ABNORMAL LOW (ref 13.0–17.0)
Immature Granulocytes: 1 %
Lymphocytes Relative: 2 %
Lymphs Abs: 0.1 10*3/uL — ABNORMAL LOW (ref 0.7–4.0)
MCH: 27.6 pg (ref 26.0–34.0)
MCHC: 31.9 g/dL (ref 30.0–36.0)
MCV: 86.6 fL (ref 80.0–100.0)
Monocytes Absolute: 0.1 10*3/uL (ref 0.1–1.0)
Monocytes Relative: 3 %
Neutro Abs: 3 10*3/uL (ref 1.7–7.7)
Neutrophils Relative %: 92 %
Platelets: 82 10*3/uL — ABNORMAL LOW (ref 150–400)
RBC: 3.66 MIL/uL — ABNORMAL LOW (ref 4.22–5.81)
RDW: 17.2 % — ABNORMAL HIGH (ref 11.5–15.5)
WBC: 3.2 10*3/uL — ABNORMAL LOW (ref 4.0–10.5)
nRBC: 0.6 % — ABNORMAL HIGH (ref 0.0–0.2)

## 2020-10-24 LAB — MAGNESIUM: Magnesium: 1.8 mg/dL (ref 1.7–2.4)

## 2020-10-24 LAB — GLUCOSE, CAPILLARY
Glucose-Capillary: 91 mg/dL (ref 70–99)
Glucose-Capillary: 119 mg/dL — ABNORMAL HIGH (ref 70–99)
Glucose-Capillary: 69 mg/dL — ABNORMAL LOW (ref 70–99)
Glucose-Capillary: 109 mg/dL — ABNORMAL HIGH (ref 70–99)
Glucose-Capillary: 98 mg/dL (ref 70–99)
Glucose-Capillary: 96 mg/dL (ref 70–99)

## 2020-10-24 LAB — POCT I-STAT 7, (LYTES, BLD GAS, ICA,H+H)
Acid-base deficit: 1 mmol/L (ref 0.0–2.0)
Acid-base deficit: 11 mmol/L — ABNORMAL HIGH (ref 0.0–2.0)
Acid-base deficit: 12 mmol/L — ABNORMAL HIGH (ref 0.0–2.0)
Acid-base deficit: 14 mmol/L — ABNORMAL HIGH (ref 0.0–2.0)
Bicarbonate: 13.2 mmol/L — ABNORMAL LOW (ref 20.0–28.0)
Bicarbonate: 14 mmol/L — ABNORMAL LOW (ref 20.0–28.0)
Bicarbonate: 14.8 mmol/L — ABNORMAL LOW (ref 20.0–28.0)
Bicarbonate: 25.6 mmol/L (ref 20.0–28.0)
Calcium, Ion: 0.99 mmol/L — ABNORMAL LOW (ref 1.15–1.40)
Calcium, Ion: 1.05 mmol/L — ABNORMAL LOW (ref 1.15–1.40)
Calcium, Ion: 1.09 mmol/L — ABNORMAL LOW (ref 1.15–1.40)
Calcium, Ion: 1.12 mmol/L — ABNORMAL LOW (ref 1.15–1.40)
HCT: 24 % — ABNORMAL LOW (ref 39.0–52.0)
HCT: 28 % — ABNORMAL LOW (ref 39.0–52.0)
HCT: 29 % — ABNORMAL LOW (ref 39.0–52.0)
HCT: 32 % — ABNORMAL LOW (ref 39.0–52.0)
Hemoglobin: 10.9 g/dL — ABNORMAL LOW (ref 13.0–17.0)
Hemoglobin: 8.2 g/dL — ABNORMAL LOW (ref 13.0–17.0)
Hemoglobin: 9.5 g/dL — ABNORMAL LOW (ref 13.0–17.0)
Hemoglobin: 9.9 g/dL — ABNORMAL LOW (ref 13.0–17.0)
O2 Saturation: 100 %
O2 Saturation: 99 %
O2 Saturation: 99 %
O2 Saturation: 99 %
Patient temperature: 36
Patient temperature: 36
Patient temperature: 36.1
Potassium: 3.2 mmol/L — ABNORMAL LOW (ref 3.5–5.1)
Potassium: 3.3 mmol/L — ABNORMAL LOW (ref 3.5–5.1)
Potassium: 3.3 mmol/L — ABNORMAL LOW (ref 3.5–5.1)
Potassium: 3.3 mmol/L — ABNORMAL LOW (ref 3.5–5.1)
Sodium: 141 mmol/L (ref 135–145)
Sodium: 141 mmol/L (ref 135–145)
Sodium: 142 mmol/L (ref 135–145)
Sodium: 152 mmol/L — ABNORMAL HIGH (ref 135–145)
TCO2: 14 mmol/L — ABNORMAL LOW (ref 22–32)
TCO2: 15 mmol/L — ABNORMAL LOW (ref 22–32)
TCO2: 16 mmol/L — ABNORMAL LOW (ref 22–32)
TCO2: 27 mmol/L (ref 22–32)
pCO2 arterial: 31 mmHg — ABNORMAL LOW (ref 32.0–48.0)
pCO2 arterial: 33.6 mmHg (ref 32.0–48.0)
pCO2 arterial: 34 mmHg (ref 32.0–48.0)
pCO2 arterial: 50.4 mmHg — ABNORMAL HIGH (ref 32.0–48.0)
pH, Arterial: 7.193 — CL (ref 7.350–7.450)
pH, Arterial: 7.252 — ABNORMAL LOW (ref 7.350–7.450)
pH, Arterial: 7.258 — ABNORMAL LOW (ref 7.350–7.450)
pH, Arterial: 7.309 — ABNORMAL LOW (ref 7.350–7.450)
pO2, Arterial: 174 mmHg — ABNORMAL HIGH (ref 83.0–108.0)
pO2, Arterial: 175 mmHg — ABNORMAL HIGH (ref 83.0–108.0)
pO2, Arterial: 178 mmHg — ABNORMAL HIGH (ref 83.0–108.0)
pO2, Arterial: 394 mmHg — ABNORMAL HIGH (ref 83.0–108.0)

## 2020-10-24 LAB — LIPID PANEL
Cholesterol: 113 mg/dL (ref 0–200)
HDL: <10 mg/dL — ABNORMAL LOW (ref >40)
LDL Cholesterol: 29 mg/dL (ref 0–99)
Triglycerides: 211 mg/dL — ABNORMAL HIGH (ref <150)
VLDL: 42 mg/dL — ABNORMAL HIGH (ref 0–40)

## 2020-10-24 LAB — COMPREHENSIVE METABOLIC PANEL
ALT: 167 U/L — ABNORMAL HIGH (ref 0–44)
AST: 287 U/L — ABNORMAL HIGH (ref 15–41)
Albumin: 1.8 g/dL — ABNORMAL LOW (ref 3.5–5.0)
Alkaline Phosphatase: 94 U/L (ref 38–126)
Anion gap: 17 — ABNORMAL HIGH (ref 5–15)
BUN: 58 mg/dL — ABNORMAL HIGH (ref 6–20)
CO2: 13 mmol/L — ABNORMAL LOW (ref 22–32)
Calcium: 7.3 mg/dL — ABNORMAL LOW (ref 8.9–10.3)
Chloride: 111 mmol/L (ref 98–111)
Creatinine, Ser: 3.22 mg/dL — ABNORMAL HIGH (ref 0.61–1.24)
GFR, Estimated: 21 mL/min — ABNORMAL LOW (ref >60)
Glucose, Bld: 120 mg/dL — ABNORMAL HIGH (ref 70–99)
Potassium: 3.5 mmol/L (ref 3.5–5.1)
Sodium: 141 mmol/L (ref 135–145)
Total Bilirubin: 1.5 mg/dL — ABNORMAL HIGH (ref 0.3–1.2)
Total Protein: 4.3 g/dL — ABNORMAL LOW (ref 6.5–8.1)

## 2020-10-24 LAB — ECHOCARDIOGRAM COMPLETE
AR max vel: 1.48 cm2
AV Area VTI: 1.48 cm2
AV Area mean vel: 1.28 cm2
AV Mean grad: 11 mmHg
AV Peak grad: 18.8 mmHg
Ao pk vel: 2.17 m/s
Height: 70 in
S' Lateral: 3.2 cm
Single Plane A4C EF: 49.5 %
Weight: 3901.26 oz

## 2020-10-24 LAB — PREPARE RBC (CROSSMATCH)

## 2020-10-24 LAB — LIPASE, BLOOD: Lipase: 50 U/L (ref 11–51)

## 2020-10-24 LAB — ABO/RH: ABO/RH(D): B POS

## 2020-10-24 LAB — PHOSPHORUS: Phosphorus: 4.5 mg/dL (ref 2.5–4.6)

## 2020-10-24 MED ORDER — GLYCOPYRROLATE 0.2 MG/ML IJ SOLN: 0.2000 mg | INTRAMUSCULAR | Status: DC | PRN

## 2020-10-24 MED ORDER — ACETAMINOPHEN 650 MG RE SUPP: 650.0000 mg | Freq: Four times a day (QID) | RECTAL | Status: DC | PRN

## 2020-10-24 MED ORDER — DIPHENHYDRAMINE HCL 50 MG/ML IJ SOLN: 25.0000 mg | INTRAMUSCULAR | Status: DC | PRN

## 2020-10-24 MED ORDER — VASOPRESSIN 20 UNITS/100 ML INFUSION FOR SHOCK
0.0000 [IU]/min | INTRAVENOUS | Status: DC
  Administered 2020-10-24: 0.03 [IU]/min via INTRAVENOUS
  Filled 2020-10-24: qty 100

## 2020-10-24 MED ORDER — ALBUMIN HUMAN 5 % IV SOLN: INTRAVENOUS | Status: DC | PRN

## 2020-10-24 MED ORDER — GLYCOPYRROLATE 0.2 MG/ML IJ SOLN
0.2000 mg | INTRAMUSCULAR | Status: DC | PRN
  Administered 2020-10-24: 0.2 mg via INTRAVENOUS
  Filled 2020-10-24: qty 1

## 2020-10-24 MED ORDER — PHENYLEPHRINE CONCENTRATED 100MG/250ML (0.4 MG/ML) INFUSION SIMPLE
25.0000 ug/min | INTRAVENOUS | Status: DC
  Filled 2020-10-24: qty 250

## 2020-10-24 MED ORDER — DEXTROSE 50 % IV SOLN
INTRAVENOUS | Status: AC
  Administered 2020-10-24: 50 mL
  Filled 2020-10-24: qty 50

## 2020-10-24 MED ORDER — DOCUSATE SODIUM 50 MG/5ML PO LIQD: 100.0000 mg | Freq: Two times a day (BID) | ORAL | Status: DC

## 2020-10-24 MED ORDER — GLYCOPYRROLATE 1 MG PO TABS: 1.0000 mg | ORAL_TABLET | ORAL | Status: DC | PRN

## 2020-10-24 MED ORDER — DEXTROSE 5 % IV SOLN: INTRAVENOUS | Status: DC

## 2020-10-24 MED ORDER — MORPHINE SULFATE (PF) 2 MG/ML IV SOLN
2.0000 mg | INTRAVENOUS | Status: DC | PRN
Start: 2020-10-24 — End: 2020-10-24
  Administered 2020-10-24 (×2): 4 mg via INTRAVENOUS
  Filled 2020-10-24 (×2): qty 2

## 2020-10-24 MED ORDER — ROCURONIUM BROMIDE 10 MG/ML (PF) SYRINGE
PREFILLED_SYRINGE | INTRAVENOUS | Status: DC | PRN
  Administered 2020-10-23 – 2020-10-24 (×2): 50 mg via INTRAVENOUS

## 2020-10-24 MED ORDER — DEXTROSE 10 % IV SOLN: INTRAVENOUS | Status: DC

## 2020-10-24 MED ORDER — NOREPINEPHRINE 16 MG/250ML-% IV SOLN
0.0000 ug/min | INTRAVENOUS | Status: DC
  Administered 2020-10-24: 15 ug/min via INTRAVENOUS
  Administered 2020-10-24: 40 ug/min via INTRAVENOUS
  Filled 2020-10-24 (×2): qty 250

## 2020-10-24 MED ORDER — POLYETHYLENE GLYCOL 3350 17 G PO PACK
17.0000 g | PACK | Freq: Every day | ORAL | Status: DC
  Filled 2020-10-24: qty 1

## 2020-10-24 MED ORDER — POLYVINYL ALCOHOL 1.4 % OP SOLN
1.0000 [drp] | Freq: Four times a day (QID) | OPHTHALMIC | Status: DC | PRN
  Filled 2020-10-24: qty 15

## 2020-10-24 MED ORDER — LACTATED RINGERS IV SOLN: INTRAVENOUS | Status: DC | PRN

## 2020-10-24 MED ORDER — ACETAMINOPHEN 325 MG PO TABS: 650.0000 mg | ORAL_TABLET | Freq: Four times a day (QID) | ORAL | Status: DC | PRN

## 2020-10-24 MED ORDER — PHENYLEPHRINE HCL-NACL 20-0.9 MG/250ML-% IV SOLN
25.0000 ug/min | INTRAVENOUS | Status: DC
  Administered 2020-10-24: 50 ug/min via INTRAVENOUS
  Filled 2020-10-24 (×2): qty 250

## 2020-10-24 MED ORDER — VASOPRESSIN 20 UNITS/100 ML INFUSION FOR SHOCK
INTRAVENOUS | Status: DC | PRN
  Administered 2020-10-24: .03 [IU]/min via INTRAVENOUS

## 2020-10-24 MED ORDER — CHLORHEXIDINE GLUCONATE 0.12% ORAL RINSE (MEDLINE KIT)
15.0000 mL | Freq: Two times a day (BID) | OROMUCOSAL | Status: DC
  Administered 2020-10-24: 15 mL via OROMUCOSAL

## 2020-10-24 MED ORDER — ORAL CARE MOUTH RINSE
15.0000 mL | OROMUCOSAL | Status: DC
  Administered 2020-10-24 (×2): 15 mL via OROMUCOSAL

## 2020-10-24 MED ORDER — VASOPRESSIN 20 UNIT/ML IV SOLN
INTRAVENOUS | Status: DC | PRN
  Administered 2020-10-23 – 2020-10-24 (×3): 2 [IU] via INTRAVENOUS

## 2020-10-25 ENCOUNTER — Encounter (HOSPITAL_COMMUNITY): Payer: Self-pay | Admitting: Surgery

## 2020-10-25 ENCOUNTER — Ambulatory Visit: Payer: Self-pay | Admitting: Orthopedic Surgery

## 2020-10-25 LAB — BPAM RBC
Blood Product Expiration Date: 202202122359
Blood Product Expiration Date: 202202152359
Unit Type and Rh: 7300
Unit Type and Rh: 7300

## 2020-10-25 LAB — TYPE AND SCREEN
ABO/RH(D): B POS
Antibody Screen: NEGATIVE
Unit division: 0
Unit division: 0

## 2020-10-26 LAB — CULTURE, BLOOD (ROUTINE X 2): Special Requests: ADEQUATE

## 2020-10-27 DIAGNOSIS — R6521 Severe sepsis with septic shock: Secondary | ICD-10-CM

## 2020-10-27 DIAGNOSIS — N179 Acute kidney failure, unspecified: Secondary | ICD-10-CM

## 2020-10-27 DIAGNOSIS — I330 Acute and subacute infective endocarditis: Secondary | ICD-10-CM

## 2020-10-27 DIAGNOSIS — B958 Unspecified staphylococcus as the cause of diseases classified elsewhere: Secondary | ICD-10-CM

## 2020-10-27 DIAGNOSIS — A4101 Sepsis due to Methicillin susceptible Staphylococcus aureus: Secondary | ICD-10-CM

## 2020-10-27 DIAGNOSIS — B9561 Methicillin susceptible Staphylococcus aureus infection as the cause of diseases classified elsewhere: Secondary | ICD-10-CM

## 2020-10-28 LAB — CULTURE, BLOOD (ROUTINE X 2)
Culture: NO GROWTH
Special Requests: ADEQUATE

## 2020-11-01 ENCOUNTER — Ambulatory Visit: Payer: Self-pay | Admitting: Orthopedic Surgery

## 2020-11-02 NOTE — Progress Notes (Signed)
NAME:  Travis Mayo, MRN:  [id number], DOB:  1961/04/16, LOS: 1 ADMISSION DATE:  10/18/2020, CONSULTATION DATE:  10/17/2020 REFERRING MD: Silverio Lay , CHIEF COMPLAINT:  Found down  Brief History:  60 year old male was found down when a friend came to check on him.  He had last been seen normal 21 hours prior.  In the ED he was found to have R MCA stroke and sepsis from perforated bowel.  He was taken to the OR after discussion with family, noted to have necrotizing pancreatitis in the OR, abdominal fascia left open when he came out of the OR 1/23 to the MICU.     Past Medical History:   Hypertension Charcot foot deformity arthritis DM2 Smoker Obesity, BMI 31  Significant Hospital Events:  1/22 Admission 1/22 Ex-lap for perforated bowel, found to have necrotizing pancreatitis, left open  Consults:  1/22 Surgery 1/22 Neuro 1/22 CCM  Procedures:  1/22 Intubation >  1/22 R IJ CVL >  1/22 R Radial arterial line >   Significant Diagnostic Tests:  1/22 CT abdomen Pelvis  Extensive pneumoperitoneum within the central upper abdomen and right upper quadrant. Based on location of free gas as well as a gas/fluid collection, favor perforated duodenal ulcer over gastric ulcer or colonic perforation. Surgical consultation is recommended. Left inguinal hernia containing a portion of sigmoid colon. No bowel obstruction or strangulation. Small hiatal hernia, containing a small amount of pneumoperitoneum. Gallbladder sludge without cholelithiasis or cholecystitis.  10/16/2020 MRI Brain Sequela of acute right MCA territory infarct with 1.1 cm frontal hemorrhagic component.  Smaller infarcts involving the left frontal and occipital regions.  Right perisylvian and left frontal petechial hemorrhages.  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  1/22 MRI Angio Brain > motion degredation limts exam, moderate to severe R M1 segment narrowing with severely  diminished opacification distally. Moderate to severe narrowing of the distal basilar artery with distal reconstitution, mild to moderate multifocal left M2 segment narrowing. Multifocal bilateral PCA narrowing with mild irregularity.    Micro Data:  1/22 Blood >  1/22 sars cov 2 > neg  Antimicrobials:  Vanc 1/22>> Cefipime 1/22>> Flagyl 1/22>>   Interim History / Subjective:  Remains on high vent settings.  Overnight ABG with metabolic acidosis Started on bicarb gtt by Elink UOP since returning from OR 430cc (0.3/ml/kg/hr) On levo, vaso and neo  Objective   Blood pressure 115/79, pulse (!) 111, temperature 98.6 F (37 C), resp. rate (!) 25, height 5\' 10"  (1.778 m), weight 110.6 kg, SpO2 97 %. CVP:  [13 mmHg-18 mmHg] 13 mmHg  Vent Mode: PRVC FiO2 (%):  [40 %-100 %] 80 % Set Rate:  [16 bmp-25 bmp] 25 bmp Vt Set:  [580 mL] 580 mL PEEP:  [5 cmH20-10 cmH20] 10 cmH20 Plateau Pressure:  [16 cmH20-22 cmH20] 22 cmH20   Intake/Output Summary (Last 24 hours) at 11/18/2020 0811 Last data filed at November 18, 2020 0800 Gross per 24 hour  Intake 8906.32 ml  Output 1140 ml  Net 7766.32 ml   Filed Weights   10/09/2020 1739 10/21/2020 2030 November 18, 2020 0500  Weight: 97 kg 100 kg 110.6 kg   Physical Exam: General: Obese critically ill-appearing, sedated HENT: [place], AT, ETT Eyes: EOMI, no scleral icterus Respiratory: Coarse breath sounds bilaterally.  No crackles, wheezing or rales Cardiovascular: RRR, -M/R/G, no JVD GI: Wound vac, drains x 2 Extremities: 1+ pitting edema in lower extremities,-tenderness Neuro: [facility] Problem list  Assessment & Plan:  Acute respiratory failure with hypoxemia in setting of Pneumoperitoneum/ Massive MCA Stroke Pneumonia vs atelectasis per CT Full mechanical vent support Wean FIO2 and PEEP ABG PRN VAP prevention No plans for SBT or WUA until abdomen closed  Pneumoperitoneum in setting of severe pancreatitis S/p Ex-lap Stop  propofol Wound vac per surgery No gastric feeding for now until discuss further with surgery Check CVP Plan for repeat ex-lap in 24-48 hours  Need for sedation for mechanical ventilation PAD protocol Use fentanyl infusion titrated to RASS -1 to -2  AKI, oliguric Metabolic acidosis Goal CVP 9-12 Sodium bicarb gtt Monitor BMET and UOP Replace electrolytes as needed Avoid nephrotoxins  Septic shock in setting of severe acute pancreatitis Type of pancreatitis uncertain CVP as above Use levophed/vasopressin to attain MAP > 80 for first 24 hours (stroke), then adjust to MAP 65 on 1/24 ID consulted. Appreciated input. DC'd Vanc. Continue Cefepime and Flagyl Monitor blood cultures Trend LA  Acute R MCA Stroke Per Neuro Keep MAP > 80 for now unless neurology has different recommendations Post stroke prevention per neurology PT/OT/SLP after extubation  DM2 with hyperglycemia, Hgb A1c 5.9 Hypoglycemia in setting of bowel rest Plan Start D10 gtt CBG SSI  Chronic Left  Foot Wound Plan Wound consult in am  Present on admission: R MCA stroke Severe sepsis AKI Severe acute pancreatitis Chronic left foot wound Acute respiratory failure with hypoxemia   Best practice (evaluated daily)  Diet: NPO per surgery Pain/Anxiety/Delirium protocol (if indicated): Fentanyl  VAP protocol (if indicated): initiated DVT prophylaxis: SCD GI prophylaxis: Protonix Glucose control: SSI Mobility: BR Disposition:ICU  Goals of Care:  Last date of multidisciplinary goals of care discussion:1/22 with surgery Family and staff present: general surgery, son Summary of discussion: full scope of practice Follow up goals of care discussion due: 1/29 or sooner depending on clinical course Code Status: Full  Labs   CBC: Recent Labs  Lab 10/29/2020 1617 10/17/2020 1941 10/26/2020 2329 11/18/20 0012 November 18, 2020 0304 November 18, 2020 0319  WBC 12.8*  --   --   --   --  3.2*  NEUTROABS 12.1*  --   --   --    --  3.0  HGB 12.7*  13.6 11.6* 9.9* 8.2* 9.5* 10.1*  HCT 39.6  40.0 34.0* 29.0* 24.0* 28.0* 31.7*  MCV 85.7  --   --   --   --  86.6  PLT 136*  --   --   --   --  82*    Basic Metabolic Panel: Recent Labs  Lab 10/12/2020 1617 10/25/2020 1941 10/03/2020 2329 11-18-2020 0012 2020/11/18 0304 Nov 18, 2020 0319  NA 138  138 140 142 152* 141 141  K 3.4*  3.4* 3.2* 3.3* 3.3* 3.3* 3.5  CL 108  112*  --   --   --   --  111  CO2 12*  --   --   --   --  13*  GLUCOSE 165*  155*  --   --   --   --  120*  BUN 61*  54*  --   --   --   --  58*  CREATININE 3.12*  2.90*  --   --   --   --  3.22*  CALCIUM 8.5*  --   --   --   --  7.3*  MG  --   --   --   --   --  1.8  PHOS  --   --   --   --   --  4.5   GFR: Estimated Creatinine Clearance: 31.1 mL/min (A) (by C-G formula based on SCr of 3.22 mg/dL (H)). Recent Labs  Lab 10/05/2020 1617 10/06/2020 1727 10/03/2020 1931 10/17/2020 2122 2020/10/31 0319 10-31-20 0320  WBC 12.8*  --   --   --  3.2*  --   LATICACIDVEN  --  3.6* 2.9* 3.1*  --  3.0*    Liver Function Tests: Recent Labs  Lab 10/10/2020 1617 2020/10/31 0319  AST 67* 287*  ALT 62* 167*  ALKPHOS 133* 94  BILITOT 1.1 1.5*  PROT 5.8* 4.3*  ALBUMIN 2.2* 1.8*   Recent Labs  Lab 2020-10-31 0319  LIPASE 50   No results for input(s): AMMONIA in the last 168 hours.  ABG    Component Value Date/Time   PHART 7.258 (L) 10/31/2020 0304   PCO2ART 31.0 (L) 10/31/20 0304   PO2ART 175 (H) 10-31-2020 0304   HCO3 14.0 (L) October 31, 2020 0304   TCO2 15 (L) 2020/10/31 0304   ACIDBASEDEF 12.0 (H) 31-Oct-2020 0304   O2SAT 99.0 2020/10/31 0304     Coagulation Profile: Recent Labs  Lab 10/31/2020 1617  INR 1.2    Cardiac Enzymes: No results for input(s): CKTOTAL, CKMB, CKMBINDEX, TROPONINI in the last 168 hours.  HbA1C: Hgb A1c MFr Bld  Date/Time Value Ref Range Status  10/26/2020 07:31 PM 5.9 (H) 4.8 - 5.6 % Final    Comment:    (NOTE) Pre diabetes:          5.7%-6.4%  Diabetes:               >6.4%  Glycemic control for   <7.0% adults with diabetes     CBG: Recent Labs  Lab 10/22/2020 2034 10/12/2020 2103 10-31-2020 0121 2020/10/31 0406 10-31-20 0745  GLUCAP 66* 142* 91 119* 69*     Critical care time:     The patient is critically ill with multiple organ systems failure and requires high complexity decision making for assessment and support, frequent evaluation and titration of therapies, application of advanced monitoring technologies and extensive interpretation of multiple databases.    Additional Independent Critical Care Time: 40 Minutes.   Mechele Collin, M.D. [facility] Pulmonary/Critical Care Medicine 10-31-20 8:11 AM   Please see Amion for pager number to reach on-call Pulmonary and Critical Care Team.

## 2020-11-02 NOTE — Progress Notes (Signed)
      INFECTIOUS DISEASE ATTENDING ADDENDUM:   Date: October 30, 2020  Patient name: Travis Mayo  Medical record number: [id number]  Date of birth: Jun 01, 1961   Patient with MSSAB and hx of being critically ill with pneumoperitoneum  I DO NOT see need for vancomycin which I will dc, will leave anerobic and gram negative coverage for now and will formally see today      Acey Lav 30-Oct-2020, 7:39 AM

## 2020-11-02 NOTE — Progress Notes (Signed)
NAME:  Travis Mayo, MRN:  [id number], DOB:  April 16, 1961, LOS: 1 ADMISSION DATE:  10/18/2020, CONSULTATION DATE:  10/20/2020 REFERRING MD: Silverio Lay , CHIEF COMPLAINT:  Found down  Brief History:  60 year old male was found down when a friend came to check on him.  He had last been seen normal 21 hours prior.  In the ED he was found to have R MCA stroke and sepsis from perforated bowel.  He was taken to the OR after discussion with family, noted to have necrotizing pancreatitis in the OR, abdominal fascia left open when he came out of the OR 1/23 to the MICU.     Past Medical History:   Hypertension Charcot foot deformity arthritis DM2 Smoker Obesity, BMI 31  Significant Hospital Events:  1/22 Admission 1/22 Ex-lap for perforated bowel, found to have necrotizing pancreatitis, left open  Consults:  1/22 Surgery 1/22 Neuro 1/22 CCM  Procedures:  1/22 Intubation >  1/22 R IJ CVL >  1/22 R Radial arterial line >   Significant Diagnostic Tests:  1/22 CT abdomen Pelvis  Extensive pneumoperitoneum within the central upper abdomen and right upper quadrant. Based on location of free gas as well as a gas/fluid collection, favor perforated duodenal ulcer over gastric ulcer or colonic perforation. Surgical consultation is recommended. Left inguinal hernia containing a portion of sigmoid colon. No bowel obstruction or strangulation. Small hiatal hernia, containing a small amount of pneumoperitoneum. Gallbladder sludge without cholelithiasis or cholecystitis.  10/15/2020 MRI Brain Sequela of acute right MCA territory infarct with 1.1 cm frontal hemorrhagic component.  Smaller infarcts involving the left frontal and occipital regions.  Right perisylvian and left frontal petechial hemorrhages.  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  1/22 MRI Angio Brain > motion degredation limts exam, moderate to severe R M1 segment narrowing with severely  diminished opacification distally. Moderate to severe narrowing of the distal basilar artery with distal reconstitution, mild to moderate multifocal left M2 segment narrowing. Multifocal bilateral PCA narrowing with mild irregularity.    Micro Data:  1/22 Blood >  1/22 sars cov 2 > neg  Antimicrobials:  Vanc 1/22>> Cefipime 1/22>> Flagyl 1/22>>   Interim History / Subjective:  Returned from OR in shock In OR made 80cc/urine, received 2600 cc crystalloid, 750 mL albumin On propofol On multiple vasopressors Left open  Objective   Blood pressure (!) 74/56, pulse (!) 117, temperature (!) 96.26 F (35.7 C), resp. rate (!) 25, height 5\' 10"  (1.778 m), weight 97 kg, SpO2 (!) 76 %.    Vent Mode: PRVC FiO2 (%):  [40 %-100 %] 40 % Set Rate:  [16 bmp-25 bmp] 25 bmp Vt Set:  [580 mL] 580 mL PEEP:  [5 cmH20] 5 cmH20 Plateau Pressure:  [16 cmH20-22 cmH20] 22 cmH20   Intake/Output Summary (Last 24 hours) at 11/02/2020 0204 Last data filed at 11/02/2020 0117 Gross per 24 hour  Intake 7103.23 ml  Output 580 ml  Net 6523.23 ml   Filed Weights   10/03/2020 1739  Weight: 97 kg    Examination:  General:  In bed on vent HENT: NCAT ETT in place PULM: CTA B, vent supported breathing CV: Tachycardia, no mgr GI: wound vac over midline abdominal wound, two drains left lower quadrant, no bowel sounds MSK: normal bulk and tone Neuro: sedated on vent   Resolved Hospital Problem list     Assessment & Plan:  Acute respiratory failure with hypoxemia in setting of  Pneumoperitoneum/ Massive MCA Stroke Pneumonia vs atelectasis per CT Full mechanical vent support VAP prevention No plans for SBT or WUA until abdomen closed  Pneumoperitoneum in setting of severe pancreatitis S/p Ex-lap Stop propofol Wound vac per surgery No gastric feeding for now until discuss further with surgery Check CVP Check lipase Continue LR at 150cc/hr  Need for sedation for mechanical ventilation Add PAD  protocol Stop propofol Use fentanyl infusion titrated to RASS -1 to -2  AKI, oliguric Goal CVP 9-12 Continue LR at 150cc/hr Monitor BMET and UOP Replace electrolytes as needed Avoid nephrotoxins  Septic shock in setting of severe acute pancreatitis Type of pancreatitis uncertain CVP as above Use levophed/vasopressin to attain MAP > 80 for first 24 hours (stroke), then adjust to MAP 65 on 1/24 Check lactic acid Continue vanc/cefepime/flagyl for now Monitor blood cultures  Acute R MCA Stroke Per Neuro Keep MAP > 80 for now unless neurology has different recommendations Post stroke prevention per neurology PT/OT/SLP after extubation  DM2 with hyperglycemia, Hgb A1c 5.9 Plan CBG SSI  Chronic Left  Foot Wound Plan Wound consult in am  Present on admission: R MCA stroke Severe sepsis AKI Severe acute pancreatitis Chronic left foot wound Acute respiratory failure with hypoxemia   Best practice (evaluated daily)  Diet: NPO Pain/Anxiety/Delirium protocol (if indicated): Fentanyl  VAP protocol (if indicated): initiated DVT prophylaxis: SCD GI prophylaxis: Protonix Glucose control: SSI Mobility: BR Disposition:ICU  Goals of Care:  Last date of multidisciplinary goals of care discussion:1/22 with surgery Family and staff present: general surgery, son Summary of discussion: full scope of practice Follow up goals of care discussion due: 1/29 or sooner depending on clinical course Code Status: Full  Labs   CBC: Recent Labs  Lab November 08, 2020 1617 11-08-2020 1941 2020-11-08 2329 10/22/2020 0012  WBC 12.8*  --   --   --   NEUTROABS 12.1*  --   --   --   HGB 12.7*  13.6 11.6* 9.9* 8.2*  HCT 39.6  40.0 34.0* 29.0* 24.0*  MCV 85.7  --   --   --   PLT 136*  --   --   --     Basic Metabolic Panel: Recent Labs  Lab 2020/11/08 1617 11/08/2020 1941 November 08, 2020 2329 10/16/2020 0012  NA 138  138 140 142 152*  K 3.4*  3.4* 3.2* 3.3* 3.3*  CL 108  112*  --   --   --   CO2  12*  --   --   --   GLUCOSE 165*  155*  --   --   --   BUN 61*  54*  --   --   --   CREATININE 3.12*  2.90*  --   --   --   CALCIUM 8.5*  --   --   --    GFR: Estimated Creatinine Clearance: 32.4 mL/min (A) (by C-G formula based on SCr of 2.9 mg/dL (H)). Recent Labs  Lab 11-08-20 1617 11-08-2020 1727 11-08-2020 1931 08-Nov-2020 2122  WBC 12.8*  --   --   --   LATICACIDVEN  --  3.6* 2.9* 3.1*    Liver Function Tests: Recent Labs  Lab Nov 08, 2020 1617  AST 67*  ALT 62*  ALKPHOS 133*  BILITOT 1.1  PROT 5.8*  ALBUMIN 2.2*   No results for input(s): LIPASE, AMYLASE in the last 168 hours. No results for input(s): AMMONIA in the last 168 hours.  ABG    Component Value Date/Time  PHART 7.309 (L) 10/26/2020 0012   PCO2ART 50.4 (H) 10/13/2020 0012   PO2ART 174 (H) 11/01/2020 0012   HCO3 25.6 10/13/2020 0012   TCO2 27 10/12/2020 0012   ACIDBASEDEF 1.0 10/11/2020 0012   O2SAT 99.0 10/04/2020 0012     Coagulation Profile: Recent Labs  Lab November 02, 2020 1617  INR 1.2    Cardiac Enzymes: No results for input(s): CKTOTAL, CKMB, CKMBINDEX, TROPONINI in the last 168 hours.  HbA1C: Hgb A1c MFr Bld  Date/Time Value Ref Range Status  11-02-20 07:31 PM 5.9 (H) 4.8 - 5.6 % Final    Comment:    (NOTE) Pre diabetes:          5.7%-6.4%  Diabetes:              >6.4%  Glycemic control for   <7.0% adults with diabetes     CBG: Recent Labs  Lab 02-Nov-2020 2034 11-02-20 2103 10/06/2020 0121  GLUCAP 66* 142* 91     Critical care time: 60 minutes    Heber Espino, MD [place] [facility] Pager: [phone] Cell: [phone] If no response, call [phone]   10/17/2020 2:04 AM

## 2020-11-02 NOTE — Progress Notes (Signed)
1 Day Post-Op   Subjective/Chief Complaint: Progressive shock overnight, underwent laparotomy with findings below.   Objective: Vital signs in last 24 hours: Temp:  [96.26 F (35.7 C)-98.96 F (37.2 C)] 98.6 F (37 C) (01/23 0800) Pulse Rate:  [107-144] 111 (01/23 0800) Resp:  [11-37] 25 (01/23 0800) BP: (72-191)/(47-121) 115/79 (01/23 0800) SpO2:  [76 %-100 %] 97 % (01/23 0800) Arterial Line BP: (70-161)/(47-70) 131/64 (01/23 0800) FiO2 (%):  [40 %-100 %] 80 % (01/23 0801) Weight:  [97 kg-110.6 kg] 110.6 kg (01/23 0500) Last BM Date:  (PTA)  Intake/Output from previous day: 01/22 0701 - 01/23 0700 In: 8598.7 [I.V.:7184.4; IV Piggyback:1414.3] Out: 1140 [Urine:430; Drains:460; Blood:50] Intake/Output this shift: Total I/O In: 307.6 [I.V.:307.6] Out: -   He is intubated and unresponsive On 3 high-dose pressors (levo at 37, neo at 25, vaso at 0.03) On vent, FiO2 80% PEEP of 10 Abdomen soft, VAC output serosanguineous, superior drain output serous, inferior drain (and lesser sac) output slightly murky brownish output Extremities warm, diffuse edema  Lab Results:  Recent Labs    10/06/2020 1617 10/22/2020 1941 11-02-2020 0304 11/02/2020 0319  WBC 12.8*  --   --  3.2*  HGB 12.7*  13.6   < > 9.5* 10.1*  HCT 39.6  40.0   < > 28.0* 31.7*  PLT 136*  --   --  82*   < > = values in this interval not displayed.   BMET Recent Labs    11/01/2020 1617 10/27/2020 1941 11-02-2020 0304 2020/11/02 0319  NA 138  138   < > 141 141  K 3.4*  3.4*   < > 3.3* 3.5  CL 108  112*  --   --  111  CO2 12*  --   --  13*  GLUCOSE 165*  155*  --   --  120*  BUN 61*  54*  --   --  58*  CREATININE 3.12*  2.90*  --   --  3.22*  CALCIUM 8.5*  --   --  7.3*   < > = values in this interval not displayed.   PT/INR Recent Labs    10/07/2020 1617  LABPROT 14.5  INR 1.2   ABG Recent Labs    11/02/2020 0012 02-Nov-2020 0304  PHART 7.309* 7.258*  HCO3 25.6 14.0*    Studies/Results: CT ABDOMEN  PELVIS WO CONTRAST  Result Date: 10/07/2020 CLINICAL DATA:  Abdominal distension, nausea and vomiting EXAM: CT ABDOMEN AND PELVIS WITHOUT CONTRAST TECHNIQUE: Multidetector CT imaging of the abdomen and pelvis was performed following the standard protocol without IV contrast. COMPARISON:  None. FINDINGS: Lower chest: Hypoventilatory changes are seen at the lung bases. No effusion or pneumothorax. There is a small hiatal hernia. Within the hernia there is a small amount of free gas consistent with pneumoperitoneum seen elsewhere within the abdomen. Hepatobiliary: High density material within the gallbladder compatible with sludge. No evidence of calcified gallstones or cholecystitis. The liver is unremarkable. Pancreas: Unremarkable. No pancreatic ductal dilatation or surrounding inflammatory changes. Spleen: Normal in size without focal abnormality. Adrenals/Urinary Tract: No urinary tract calculi or obstructive uropathy. Bladder is unremarkable. The adrenals are normal. Stomach/Bowel: No bowel obstruction or ileus. Diverticulosis of the sigmoid colon without diverticulitis. There is pneumoperitoneum within the central upper abdomen, with free gas surrounding the gastric antrum, proximal duodenum, and proximal transverse colon. There is a gas and fluid collection within the right upper quadrant, measuring approximately 4.9 x 4.3 cm reference image 40/3. Findings  could be related to perforated gastric/duodenal ulcer or transverse colonic perforation. Surgical consultation recommended. There is a normal appendix right lower quadrant. Vascular/Lymphatic: Aortic atherosclerosis. No enlarged abdominal or pelvic lymph nodes. Reproductive: Prostate is unremarkable. Other: Pneumoperitoneum throughout the upper abdomen. No significant ascites. There is a left inguinal hernia containing a portion of sigmoid colon, with no evidence of bowel obstruction or incarceration. Musculoskeletal: No acute or destructive bony lesions.  Reconstructed images demonstrate no additional findings. IMPRESSION: 1. Extensive pneumoperitoneum within the central upper abdomen and right upper quadrant. Based on location of free gas as well as a gas/fluid collection, favor perforated duodenal ulcer over gastric ulcer or colonic perforation. Surgical consultation is recommended. 2. Left inguinal hernia containing a portion of sigmoid colon. No bowel obstruction or strangulation. 3. Small hiatal hernia, containing a small amount of pneumoperitoneum. 4. Gallbladder sludge without cholelithiasis or cholecystitis. Critical Value/emergent results were called by telephone at the time of interpretation on 10/09/2020 at 5:00 pm to provider DAVID YAO , who verbally acknowledged these results. Electronically Signed   By: Sharlet Salina M.D.   On: 10/03/2020 17:01   CT HEAD WO CONTRAST  Result Date: 11-22-20 CLINICAL DATA:  Stroke follow-up EXAM: CT HEAD WITHOUT CONTRAST TECHNIQUE: Contiguous axial images were obtained from the base of the skull through the vertex without intravenous contrast. COMPARISON:  Brain MRI from yesterday FINDINGS: Brain: Confluent acute infarct in the right MCA territory which matches diffusion imaging from prior brain MRI. Hemorrhagic component at the anterior aspect of the right MCA infarct which has a serpentine appearance on prior FLAIR imaging, likely petechial. Smaller acute (by MRI) infarct in the left frontal lobe. No midline shift, entrapment, or hydrocephalus. Right CP angle mass which is very subtle in distinction to the prior MRI. Vascular: No hyperdense vessel or unexpected calcification. Skull: Normal. Negative for fracture or focal lesion. Sinuses/Orbits: No acute finding. IMPRESSION: Known acute infarcts most notably affecting the right MCA territory. No midline shift or progressive petechial hemorrhage. Electronically Signed   By: Marnee Spring M.D.   On: 2020/11/22 05:46   MR ANGIO HEAD WO CONTRAST  Addendum Date:  10/19/2020   ADDENDUM REPORT: 10/02/2020 19:05 ADDENDUM: These results were called by telephone at the time of interpretation on 10/17/2020 at 6:50 Pm to provider Dr. Silverio Lay, Who verbally acknowledged these results. Electronically Signed   By: Stana Bunting M.D.   On: 10/22/2020 19:05   Result Date: 10/14/2020 CLINICAL DATA:  Neuro deficit, acute, stroke suspected EXAM: MRA HEAD WITHOUT CONTRAST TECHNIQUE: Angiographic images of the Circle of Willis were obtained using MRA technique without intravenous contrast. COMPARISON:  Concurrent MRI head. FINDINGS: Anterior circulation: Patent ICAs. Mild proximal right supraclinoid ICA narrowing. Patent bilateral ophthalmic artery origins. Moderate to severe narrowing of the right M1 segment. Minimal opacification of the proximal M2 branches with severely diminished opacification distally. Patent left MCA. Mild to moderate multifocal narrowing involving the proximal left M2 segments versus artifact. Patent ACAs. Posterior circulation: Dominant right vertebral artery. Patent V4 segments. Proximally patent PICA. Moderate to severe narrowing of the distal basilar artery with distal reconstitution. There is also mild narrowing of the proximal basilar artery. Patent left superior cerebellar artery. The right superior cerebral artery is proximally patent, not well visualized distally. Patent posterior cerebral arteries with vessel wall irregularity and multifocal mild narrowing. Anatomic variants: None of significance. Please note that image quality is degraded by motion artifact. This report was delayed secondary to IT issues regarding the images. IMPRESSION: Motion degradation  limits evaluation. Moderate to severe right M1 segment narrowing with severely diminished opacification distally. Moderate to severe narrowing of the distal basilar artery with distal reconstitution. Mild-to-moderate multifocal left M2 segment narrowing. Multifocal bilateral PCA narrowing with mild  irregularity may reflect sequela of atheromatous disease versus vasculitis. Electronically Signed: By: Stana Bunting M.D. On: 2020-11-14 18:49   MR BRAIN WO CONTRAST  Result Date: 2020-11-14 CLINICAL DATA:  Neuro deficit, acute, stroke suspected EXAM: MRI HEAD WITHOUT CONTRAST TECHNIQUE: Multiplanar, multiecho pulse sequences of the brain and surrounding structures were obtained without intravenous contrast. COMPARISON:  November 14, 2020. FINDINGS: Please note motion artifact and limited sequence acquisition limits evaluation. MRI HEAD FINDINGS Brain: Sequela of acute right MCA territory infarct with 1.1 cm right frontal hemorrhagic component. Additional smaller infarcts are seen within the left frontal and occipital regions. Minimal SWI signal dropout along the right sylvian fissure and involving the left frontal lobe may reflect petechial hemorrhages. Mild cerebral atrophy with ex vacuo dilatation. No midline shift or ventriculomegaly. 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 Vascular: Please see MRA head. Skull and upper cervical spine: Normal marrow signal. Sinuses/Orbits: No acute orbital finding. Other: None. IMPRESSION: Sequela of acute right MCA territory infarct with 1.1 cm frontal hemorrhagic component. Smaller infarcts involving the left frontal and occipital regions. Right perisylvian and left frontal petechial hemorrhages. 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 Motion artifact and limited sequence acquisition limits evaluation. These results were called by telephone at the time of interpretation on Nov 14, 2020 at 6:00 pm to provider Dr. Chaney Malling, Who verbally acknowledged these results. Electronically Signed   By: Stana Bunting M.D.   On: 2020-11-14 18:06   DG Chest Port 1 View  Result Date: 10/17/2020 CLINICAL DATA:  Respiratory failure. EXAM:  PORTABLE CHEST 1 VIEW COMPARISON:  Chest radiograph 11-14-2020 FINDINGS: ET tube mid trachea. Enteric tube courses inferior to the diaphragm. Monitoring leads overlie the patient. Stable cardiac and mediastinal contours. New right IJ central venous catheter tip projects over the superior vena cava. Low lung volumes. Scattered opacities bilaterally. Possible small left pleural effusion. No pneumothorax. IMPRESSION: 1. New right IJ central venous catheter tip projects over the superior vena cava. 2. Low lung volumes with scattered opacities which may represent infection or atelectasis. Electronically Signed   By: Annia Belt M.D.   On: 10/15/2020 07:04   DG Chest Port 1 View  Result Date: 2020/11/14 CLINICAL DATA:  60 year old male with possible sepsis. Status post intubation. EXAM: PORTABLE CHEST 1 VIEW COMPARISON:  No priors. FINDINGS: An endotracheal tube is in place with tip 3.1 cm above the carina. Nasogastric tube extending into the proximal stomach, with side port in the distal third of the esophagus. Lung volumes are low. Patchy ill-defined airspace disease and areas of interstitial prominence in the lungs bilaterally, most evident throughout the left mid to lower lung. Small left pleural effusion. No right pleural effusion. No pneumothorax. Heart size is normal. The patient is rotated to the left on today's exam, resulting in distortion of the mediastinal contours and reduced diagnostic sensitivity and specificity for mediastinal pathology. IMPRESSION: 1. Support apparatus, as above. 2. The appearance of the chest is concerning for multilobar bilateral pneumonia. 3. Small left pleural effusion. Electronically Signed   By: Trudie Reed M.D.   On: 11/14/20 18:16   CT HEAD CODE STROKE WO CONTRAST  Result Date: 2020/11/14 CLINICAL  DATA:  Code stroke.  Neuro deficit, acute, stroke suspected EXAM: CT HEAD WITHOUT CONTRAST TECHNIQUE: Contiguous axial images were obtained from the base of the skull  through the vertex without intravenous contrast. COMPARISON:  None. FINDINGS: Brain: 1.0 cm right frontal hemorrhagic focus with mild peripheral edema. Ill-defined hypodense region involving the inferior right frontal lobe may be artifactual. No mass lesion. No midline shift, ventriculomegaly or extra-axial fluid collection. Vascular: No hyperdense vessel or unexpected calcification. Skull: No acute finding. Sinuses/Orbits: No acute orbital finding. Minimal maxillary sinus mucosal thickening. Other: None. ASPECTS [facility]([place] Stroke Program Early CT Score) - Ganglionic level infarction (caudate, lentiform nuclei, internal capsule, insula, M1-M3 cortex): 7 - Supraganglionic infarction (M4-M6 cortex): 3 Total score (0-10 with 10 being normal): 10 IMPRESSION: 1. 1.0 cm right frontal hemorrhagic focus. Consider MRI to exclude underlying lesion. 2. Ill-defined inferior right frontal hypodensity may reflect artifact versus subacute insult. 3. ASPECTS is 10 Code stroke imaging results were communicated on 10/28/2020 at 4:38 pm to provider Dr. Amada JupiterKirkpatrick via telephone, who verbally acknowledged these results. Electronically Signed   By: Stana Buntinghikanele  Emekauwa M.D.   On: 10/14/2020 16:46    Anti-infectives: Anti-infectives (From admission, onward)   Start     Dose/Rate Route Frequency Ordered Stop   01/14/21 1900  ceFEPIme (MAXIPIME) 2 g in sodium chloride 0.9 % 100 mL IVPB        2 g 200 mL/hr over 30 Minutes Intravenous Every 24 hours 10/15/2020 1804     01/14/21 0200  metroNIDAZOLE (FLAGYL) IVPB 500 mg        500 mg 100 mL/hr over 60 Minutes Intravenous Every 8 hours 10/19/2020 1843     10/22/2020 1900  vancomycin (VANCOREADY) IVPB 2000 mg/400 mL        2,000 mg 200 mL/hr over 120 Minutes Intravenous  Once 10/22/2020 1854 10/18/2020 2241   10/19/2020 1854  vancomycin variable dose per unstable renal function (pharmacist dosing)  Status:  Discontinued         Does not apply See admin instructions 10/06/2020 1854 01/14/21 0737    10/16/2020 1730  ceFEPIme (MAXIPIME) 2 g in sodium chloride 0.9 % 100 mL IVPB        2 g 200 mL/hr over 30 Minutes Intravenous  Once 10/13/2020 1724 10/20/2020 1856   10/27/2020 1730  metroNIDAZOLE (FLAGYL) IVPB 500 mg  Status:  Discontinued        500 mg 100 mL/hr over 60 Minutes Intravenous  Once 10/05/2020 1724 10/04/2020 2046      Assessment/Plan: s/p Procedure(s): EXPLORATORY LAPAROTOMY (N/A) APPLICATION OF WOUND VAC WITH DRAIN PLACEMENT X2 (N/A) Prognosis extremely poor with multiorgan dysfunction.  Pending clinical progress today, tentatively plan for reexploration and closure tomorrow.  Source of free air is unclear, question necrotizing pancreatitis versus contained bowel perforation although all of the bowel appeared healthy and without any active inflammation.  This aspect of his possible etiologies of septic shock can be considered addressed currently with everything drained, unclear if his ongoing renal failure and shock may be partially attributable to his lungs as well.  We will continue to follow closely, recommend palliative care discussion with family    LOS: 1 day    Berna BueChelsea A Naria Abbey November 17, 2020

## 2020-11-02 NOTE — Consult Note (Signed)
   Date of Admission:  10/27/2020          Reason for Consult: MSSA bacteremia    Referring Provider: [facility] auto consult   Assessment:  1. 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 Perforated viscus with extensive pneumoperitoneum status post exploratory laparotomy by surgery which did not identify a perforation yesterday. 3. Septic shock with multiorgan failure 4. 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 Aortic and mitral calcifications on TTE in August 6. Diabetes mellitus 7. Obesity 8. "Lazy esophagus" that prohibited prior TEE  Plan:  1. Continue cefepime and metronidazole to cover the MSSA as well as gram-negative rods and anaerobes in the abdomen 2. Continued critical care 3. Agree with surgery returning to the OR if continued aggressive care is desired. 4. TTE complete and not showing vegetations but poor windows, TEE sounds unlikely to be do-able 5. IF and when he stabilizes he should have a central line holiday but that does not sound likely to happen anytime soon. 6. Closer exam of prior osteomyelitis and hardware infection in left ankle and would likely re-image with MRI if he stabilizes given he had osteo here and did not have BKA  Active Problems:   Pneumoperitoneum   Scheduled Meds: . sodium chloride   Intravenous Once  . chlorhexidine gluconate (MEDLINE KIT)  15 mL Mouth Rinse BID  . Chlorhexidine Gluconate Cloth  6 each Topical Q0600  . insulin aspart  0-9 Units Subcutaneous Q4H  . mouth rinse  15 mL Mouth Rinse 10 times per day  . mupirocin ointment  1 application Nasal BID  . pantoprazole (PROTONIX) IV  40 mg Intravenous QHS   Continuous Infusions: . sodium chloride 10 mL/hr at 10/13/2020 0900  . ceFEPime (MAXIPIME) IV     . dextrose 50 mL/hr at 10/09/2020 1021  . fentaNYL infusion INTRAVENOUS 100 mcg/hr (10/08/2020 0900)  . metronidazole 500 mg (10/20/2020 1024)  . norepinephrine (LEVOPHED) Adult infusion 40 mcg/min (10/23/2020 1017)  . phenylephrine (NEO-SYNEPHRINE) Adult infusion 50 mcg/min (10/28/2020 1019)  . sodium bicarbonate (isotonic) 150 mEq in D5W 1000 mL infusion 100 mL/hr at 10/16/2020 1015  . vasopressin 0.03 Units/min (10/04/2020 0900)   PRN Meds:.fentaNYL, ipratropium-albuterol  HPI: Alwyn Sterbenz is a 60 y.o. male with diabetes mellitus who was seen by our team in August, when he was admitted with methicillin sensitive Staph aureus bacteremia associated wit hInfection of a hardware associated to myelitis of ankle and foot.   He initially underwent a left tibiocalcaneal fusion with Dr. Duda on March 26, 2020, then removal of hardware and debridement of ankle on May 12, 2020 repeat debridement on May 21, 2020.  He had a 2D echocardiogram at that time in August that showed thickening of his mitral and aortic valves.  He could not undergo TEE due to a "lazy esophagus".  He was treated with 6 weeks of IV cefazolin after clearance of his blood cultures and followed closely in the infectious disease clinic.  He ultimately underwent split thickness skin grafting to his left ankle wound with Dr. Duda on August 04, 2020.  He then apparently had been doing well until he was checked in on by a friend and found to be confused with a left facial droop and left-sided paralysis.  He was brought to   the emergency room where he was found to have a right MCA infarct and had sepsis with multiorgan failure.  CT scan showed large pneumoperitoneum suggestive of a perforated viscus.  He was intubated for respiratory failure.  Neurology were consulted and felt that his strokes were likely due to an embolic event.  General surgery consulted and per family's wishes to do everything to keep the patient alive he was taken to  the operating room early this morning underwent exploratory laparotomy with intraperitoneal drain placement placement of negative pressure dressing.  Site of perforation was not identified despite extensive examination by general surgery in the OR.  The patient has been on broad-spectrum antibiotics in the form of vancomycin cefepime and metronidazole.  Is planned for him to go back to the operating room again.  In the interim his blood cultures returned positive for methicillin sensitive Staph aureus.  Repeat echocardiogram has been done transthoracically and does not show vegetations.  I think clinically though he clearly has bacterial endocarditis with septic embolization.       Review of Systems: Review of Systems  Unable to perform ROS: Critical illness    Past Medical History:  Diagnosis Date  . Arthritis   . Cataract   . Charcot's arthropathy    left  . Hypertension     Social History   Tobacco Use  . Smoking status: Current Some Day Smoker    Types: Cigars  . Smokeless tobacco: Never Used  . Tobacco comment: random  Vaping Use  . Vaping Use: Never used  Substance Use Topics  . Alcohol use: Yes    Alcohol/week: 20.0 standard drinks    Types: 15 Cans of beer, 5 Glasses of wine per week  . Drug use: No    No family history on file. No Known Allergies  OBJECTIVE: Blood pressure 103/80, pulse (!) 122, temperature 98.42 F (36.9 C), resp. rate (!) 28, height 5' 10" (1.778 m), weight 110.6 kg, SpO2 96 %.  Physical Exam Constitutional:      Appearance: He is ill-appearing and toxic-appearing.     Interventions: He is intubated.  HENT:     Head: Normocephalic and atraumatic.  Cardiovascular:     Rate and Rhythm: Regular rhythm. Tachycardia present.     Heart sounds: Murmur heard.  No friction rub. No gallop.   Pulmonary:     Effort: He is intubated.  Abdominal:    Feet:     Comments: He has mottling of his feet bilaterally Skin:    Coloration: Skin  is pale.  Neurological:     Mental Status: He is unresponsive.     Comments: Sedated on ventilator    Multiple lines  Lab Results Lab Results  Component Value Date   WBC 3.2 (L) 2020-11-09   HGB 10.9 (L) 11/09/2020   HCT 32.0 (L) 11/09/20   MCV 86.6 2020-11-09   PLT 82 (L) 2020-11-09    Lab Results  Component Value Date   CREATININE 3.22 (H) 2020/11/09   BUN 58 (H) 2020/11/09   NA 141 11/09/2020   K 3.2 (L) 11/09/2020   CL 111 Nov 09, 2020   CO2 13 (L) 11/09/20    Lab Results  Component Value Date   ALT 167 (H) 11/09/20   AST 287 (H) 11-09-20   ALKPHOS 94 11-09-20   BILITOT 1.5 (H) 2020/11/09     Microbiology: Recent Results (from the past 240 hour(s))  Blood culture (routine x 2)     Status: None (Preliminary  result)   Collection Time: 10/19/2020  4:32 PM   Specimen: BLOOD  Result Value Ref Range Status   Specimen Description BLOOD RIGHT ANTECUBITAL  Final   Special Requests   Final    BOTTLES DRAWN AEROBIC AND ANAEROBIC Blood Culture adequate volume   Culture  Setup Time   Final    GRAM POSITIVE COCCI IN BOTH AEROBIC AND ANAEROBIC BOTTLES Organism ID to follow CRITICAL RESULT CALLED TO, READ BACK BY AND VERIFIED WITH: Performed at [facility], [place] [address] [place], [place] [address]    Culture GRAM POSITIVE COCCI  Final   Report Status PENDING  Incomplete  Blood Culture ID Panel (Reflexed)     Status: Abnormal   Collection Time: 10/03/2020  4:32 PM  Result Value Ref Range Status   Enterococcus faecalis NOT DETECTED NOT DETECTED Final   Enterococcus Faecium NOT DETECTED NOT DETECTED Final   Listeria monocytogenes NOT DETECTED NOT DETECTED Final   Staphylococcus species DETECTED (A) NOT DETECTED Final    Comment: CRITICAL RESULT CALLED TO, READ BACK BY AND VERIFIED WITH:   Staphylococcus aureus (BCID) DETECTED (A) NOT DETECTED Final    Comment: CRITICAL RESULT CALLED TO, READ BACK BY AND VERIFIED WITH: PHARMD BRYCK VERANDA BY MESSAN  HOUEGNIFIO AT 3149 ON 08-Nov-2020    Staphylococcus epidermidis NOT DETECTED NOT DETECTED Final   Staphylococcus lugdunensis NOT DETECTED NOT DETECTED Final   Streptococcus species NOT DETECTED NOT DETECTED Final   Streptococcus agalactiae NOT DETECTED NOT DETECTED Final   Streptococcus pneumoniae NOT DETECTED NOT DETECTED Final   Streptococcus pyogenes NOT DETECTED NOT DETECTED Final   A.calcoaceticus-baumannii NOT DETECTED NOT DETECTED Final   Bacteroides fragilis NOT DETECTED NOT DETECTED Final   Enterobacterales NOT DETECTED NOT DETECTED Final   Enterobacter cloacae complex NOT DETECTED NOT DETECTED Final   Escherichia coli NOT DETECTED NOT DETECTED Final   Klebsiella aerogenes NOT DETECTED NOT DETECTED Final   Klebsiella oxytoca NOT DETECTED NOT DETECTED Final   Klebsiella pneumoniae NOT DETECTED NOT DETECTED Final   Proteus species NOT DETECTED NOT DETECTED Final   Salmonella species NOT DETECTED NOT DETECTED Final   Serratia marcescens NOT DETECTED NOT DETECTED Final   Haemophilus influenzae NOT DETECTED NOT DETECTED Final   Neisseria meningitidis NOT DETECTED NOT DETECTED Final   Pseudomonas aeruginosa NOT DETECTED NOT DETECTED Final   Stenotrophomonas maltophilia NOT DETECTED NOT DETECTED Final   Candida albicans NOT DETECTED NOT DETECTED Final   Candida auris NOT DETECTED NOT DETECTED Final   Candida glabrata NOT DETECTED NOT DETECTED Final   Candida krusei NOT DETECTED NOT DETECTED Final   Candida parapsilosis NOT DETECTED NOT DETECTED Final   Candida tropicalis NOT DETECTED NOT DETECTED Final   Cryptococcus neoformans/gattii NOT DETECTED NOT DETECTED Final   Meth resistant mecA/C and MREJ NOT DETECTED NOT DETECTED Final    Comment: Performed at [facility], [address], [place], [place] [address]  SARS Coronavirus 2 by RT PCR (hospital order, performed in [facility] hospital lab) Nasopharyngeal Nasopharyngeal Swab     Status: None   Collection Time: 10/27/2020   5:25 PM   Specimen: Nasopharyngeal Swab  Result Value Ref Range Status   SARS Coronavirus 2 NEGATIVE NEGATIVE Final    Comment: (NOTE) SARS-CoV-2 target nucleic acids are NOT DETECTED.  The SARS-CoV-2 RNA is generally detectable in upper and lower respiratory specimens during the acute phase of infection. The lowest concentration of SARS-CoV-2 viral copies this assay can detect is 250 copies /  mL. A negative result does not preclude SARS-CoV-2 infection and should not be used as the sole basis for treatment or other patient management decisions.  A negative result may occur with improper specimen collection / handling, submission of specimen other than nasopharyngeal swab, presence of viral mutation(s) within the areas targeted by this assay, and inadequate number of viral copies (<250 copies / mL). A negative result must be combined with clinical observations, patient history, and epidemiological information.  Fact Sheet for Patients:   StrictlyIdeas.no  Fact Sheet for Healthcare Providers: BankingDealers.co.za  This test is not yet approved or  cleared by the [place] FDA and has been authorized for detection and/or diagnosis of SARS-CoV-2 by FDA under an Emergency Use Authorization (EUA).  This EUA will remain in effect (meaning this test can be used) for the duration of the COVID-19 declaration under Section 564(b)(1) of the Act, 21 U.S.C. section 360bbb-3(b)(1), unless the authorization is terminated or revoked sooner.  Performed at [facility], [place] [address], [place], [place] [address]   MRSA PCR Screening     Status: Abnormal   Collection Time: 10/02/2020  8:18 PM   Specimen: Nasal Mucosa; Nasopharyngeal  Result Value Ref Range Status   MRSA by PCR POSITIVE (A) NEGATIVE Final    Comment:        The GeneXpert MRSA Assay (FDA approved for NASAL specimens only), is one component of a comprehensive MRSA  colonization surveillance program. It is not intended to diagnose MRSA infection nor to guide or monitor treatment for MRSA infections. RESULT CALLED TO, READ BACK BY AND VERIFIED WITH: SHEPHERD,B RN 11/01/2020 AT 2200 SKEEN,P Performed at [place] [address], [place], [place] [address]   Blood culture (routine x 2)     Status: None (Preliminary result)   Collection Time: 10/14/2020  9:29 PM   Specimen: BLOOD  Result Value Ref Range Status   Specimen Description BLOOD SITE NOT SPECIFIED  Final   Special Requests   Final    BOTTLES DRAWN AEROBIC AND ANAEROBIC Blood Culture adequate volume   Culture   Final    NO GROWTH < 12 HOURS Performed at [facility], [place] [address], [place], [place] [address]    Report Status PENDING  Incomplete    Alcide Evener, [place] [facility] [phone] pager  2020/11/10, 10:58 AM

## 2020-11-02 NOTE — Death Summary Note (Signed)
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 year old male with history of left Charcot ankle and foot s/p skin graft and hardware complicated by chronic wound with MSSA infection who was found with altered mental status including aphasia and left-sided weakness.  EMS was called and [facility]  Code stroke was called and had imaging demonstrated acute right MCA infarct with additional smaller infarcts in the left side concerning for cardioembolic source.  He was also found with extensive pneumoperitoneum suspected to be secondary to perforated bowel.  Patient underwent emergent ex lap neurotomy however site of perforation was not found.  With patient critically ill in needing multiple pressors, decision was made for abdomen to be left open with plan for reexploration within 24 to 48 hours if he could be stabilized.  He was transferred to the medical ICU and continued to require increasing pressor support.  Despite maximal life support, patient began to deteriorate.  Family at bedside  discussed with the critical care team patient's overall prognosis in the setting of septic shock secondary to bacteremia related to the hardware in his ankle and suspected endocarditis that likely resulted in his stroke in addition to his pneumoperitoneum.  After discussion, decision was made to withdraw care.   Pertinent Labs and Studies  Significant Diagnostic Studies CT ABDOMEN PELVIS WO CONTRAST  Result Date: 10/07/2020 CLINICAL DATA:  Abdominal distension, nausea and vomiting EXAM: CT ABDOMEN AND PELVIS WITHOUT CONTRAST TECHNIQUE: Multidetector CT imaging of the abdomen and pelvis was performed following the standard protocol without IV contrast. COMPARISON:  None. FINDINGS: Lower chest: Hypoventilatory changes are seen at the lung bases. No effusion or pneumothorax. There is a small hiatal hernia. Within the hernia there is a small amount of free gas consistent with pneumoperitoneum seen elsewhere within the abdomen. Hepatobiliary: High density material within the gallbladder compatible with sludge. No evidence of calcified gallstones or cholecystitis. The liver is unremarkable. Pancreas: Unremarkable. No pancreatic ductal dilatation or surrounding inflammatory changes. Spleen: Normal in size without focal abnormality. Adrenals/Urinary Tract: No urinary tract calculi or obstructive uropathy. Bladder is unremarkable. The adrenals are normal. Stomach/Bowel: No bowel obstruction or ileus. Diverticulosis of the sigmoid colon without diverticulitis. There is pneumoperitoneum within the central upper abdomen, with free gas surrounding the gastric antrum, proximal duodenum, and proximal transverse colon. There is a gas and fluid collection within the right upper quadrant, measuring approximately 4.9 x 4.3 cm reference image 40/3. Findings could be related to perforated gastric/duodenal ulcer or transverse colonic perforation. Surgical consultation recommended. There is a normal appendix right lower quadrant.  Vascular/Lymphatic: Aortic atherosclerosis. No enlarged abdominal or pelvic lymph nodes. Reproductive: Prostate  is unremarkable. Other: Pneumoperitoneum throughout the upper abdomen. No significant ascites. There is a left inguinal hernia containing a portion of sigmoid colon, with no evidence of bowel obstruction or incarceration. Musculoskeletal: No acute or destructive bony lesions. Reconstructed images demonstrate no additional findings. IMPRESSION: 1. Extensive pneumoperitoneum within the central upper abdomen and right upper quadrant. Based on location of free gas as well as a gas/fluid collection, favor perforated duodenal ulcer over gastric ulcer or colonic perforation. Surgical consultation is recommended. 2. Left inguinal hernia containing a portion of sigmoid colon. No bowel obstruction or strangulation. 3. Small hiatal hernia, containing a small amount of pneumoperitoneum. 4. Gallbladder sludge without cholelithiasis or cholecystitis. Critical Value/emergent results were called by telephone at the time of interpretation on 10/14/2020 at 5:00 pm to provider DAVID YAO , who verbally acknowledged these results. Electronically Signed   By: Sharlet Salina M.D.   On: 10/25/2020 17:01   CT HEAD WO CONTRAST  Result Date: 11/13/2020 CLINICAL DATA:  Stroke follow-up EXAM: CT HEAD WITHOUT CONTRAST TECHNIQUE: Contiguous axial images were obtained from the base of the skull through the vertex without intravenous contrast. COMPARISON:  Brain MRI from yesterday FINDINGS: Brain: Confluent acute infarct in the right MCA territory which matches diffusion imaging from prior brain MRI. Hemorrhagic component at the anterior aspect of the right MCA infarct which has a serpentine appearance on prior FLAIR imaging, likely petechial. Smaller acute (by MRI) infarct in the left frontal lobe. No midline shift, entrapment, or hydrocephalus. Right CP angle mass which is very subtle in distinction to the prior MRI. Vascular: No  hyperdense vessel or unexpected calcification. Skull: Normal. Negative for fracture or focal lesion. Sinuses/Orbits: No acute finding. IMPRESSION: Known acute infarcts most notably affecting the right MCA territory. No midline shift or progressive petechial hemorrhage. Electronically Signed   By: Marnee Spring M.D.   On: November 13, 2020 05:46   MR ANGIO HEAD WO CONTRAST  Addendum Date: 10/31/2020   ADDENDUM REPORT: 10/02/2020 19:05 ADDENDUM: These results were called by telephone at the time of interpretation on 10/16/2020 at 6:50 Pm to provider Dr. Silverio Lay, Who verbally acknowledged these results. Electronically Signed   By: Stana Bunting M.D.   On: 10/05/2020 19:05   Result Date: 10/12/2020 CLINICAL DATA:  Neuro deficit, acute, stroke suspected EXAM: MRA HEAD WITHOUT CONTRAST TECHNIQUE: Angiographic images of the Circle of Willis were obtained using MRA technique without intravenous contrast. COMPARISON:  Concurrent MRI head. FINDINGS: Anterior circulation: Patent ICAs. Mild proximal right supraclinoid ICA narrowing. Patent bilateral ophthalmic artery origins. Moderate to severe narrowing of the right M1 segment. Minimal opacification of the proximal M2 branches with severely diminished opacification distally. Patent left MCA. Mild to moderate multifocal narrowing involving the proximal left M2 segments versus artifact. Patent ACAs. Posterior circulation: Dominant right vertebral artery. Patent V4 segments. Proximally patent PICA. Moderate to severe narrowing of the distal basilar artery with distal reconstitution. There is also mild narrowing of the proximal basilar artery. Patent left superior cerebellar artery. The right superior cerebral artery is proximally patent, not well visualized distally. Patent posterior cerebral arteries with vessel wall irregularity and multifocal mild narrowing. Anatomic variants: None of significance. Please note that image quality is degraded by motion artifact. This report  was delayed secondary to IT issues regarding the images. IMPRESSION: Motion degradation limits evaluation. Moderate to severe right M1 segment narrowing with severely diminished opacification distally. Moderate to severe narrowing of the distal basilar artery with distal reconstitution. Mild-to-moderate multifocal left M2 segment narrowing. Multifocal bilateral  PCA narrowing with mild irregularity may reflect sequela of atheromatous disease versus vasculitis. Electronically Signed: By: Stana Bunting M.D. On: 10/19/2020 18:49   MR BRAIN WO CONTRAST  Result Date: 10/19/2020 CLINICAL DATA:  Neuro deficit, acute, stroke suspected EXAM: MRI HEAD WITHOUT CONTRAST TECHNIQUE: Multiplanar, multiecho pulse sequences of the brain and surrounding structures were obtained without intravenous contrast. COMPARISON:  10/19/2020. FINDINGS: Please note motion artifact and limited sequence acquisition limits evaluation. MRI HEAD FINDINGS Brain: Sequela of acute right MCA territory infarct with 1.1 cm right frontal hemorrhagic component. Additional smaller infarcts are seen within the left frontal and occipital regions. Minimal SWI signal dropout along the right sylvian fissure and involving the left frontal lobe may reflect petechial hemorrhages. Mild cerebral atrophy with ex vacuo dilatation. No midline shift or ventriculomegaly. 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 Vascular: Please see MRA head. Skull and upper cervical spine: Normal marrow signal. Sinuses/Orbits: No acute orbital finding. Other: None. IMPRESSION: Sequela of acute right MCA territory infarct with 1.1 cm frontal hemorrhagic component. Smaller infarcts involving the left frontal and occipital regions. Right perisylvian and left frontal petechial hemorrhages. 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 Motion  artifact and limited sequence acquisition limits evaluation. These results were called by telephone at the time of interpretation on 10/17/2020 at 6:00 pm to provider Dr. Chaney Malling, Who verbally acknowledged these results. Electronically Signed   By: Stana Bunting M.D.   On: 10/16/2020 18:06   DG Chest Port 1 View  Result Date: 10-28-20 CLINICAL DATA:  Respiratory failure. EXAM: PORTABLE CHEST 1 VIEW COMPARISON:  Chest radiograph 10/09/2020 FINDINGS: ET tube mid trachea. Enteric tube courses inferior to the diaphragm. Monitoring leads overlie the patient. Stable cardiac and mediastinal contours. New right IJ central venous catheter tip projects over the superior vena cava. Low lung volumes. Scattered opacities bilaterally. Possible small left pleural effusion. No pneumothorax. IMPRESSION: 1. New right IJ central venous catheter tip projects over the superior vena cava. 2. Low lung volumes with scattered opacities which may represent infection or atelectasis. Electronically Signed   By: Annia Belt M.D.   On: October 28, 2020 07:04   DG Chest Port 1 View  Result Date: 10/30/2020 CLINICAL DATA:  60 year old male with possible sepsis. Status post intubation. EXAM: PORTABLE CHEST 1 VIEW COMPARISON:  No priors. FINDINGS: An endotracheal tube is in place with tip 3.1 cm above the carina. Nasogastric tube extending into the proximal stomach, with side port in the distal third of the esophagus. Lung volumes are low. Patchy ill-defined airspace disease and areas of interstitial prominence in the lungs bilaterally, most evident throughout the left mid to lower lung. Small left pleural effusion. No right pleural effusion. No pneumothorax. Heart size is normal. The patient is rotated to the left on today's exam, resulting in distortion of the mediastinal contours and reduced diagnostic sensitivity and specificity for mediastinal pathology. IMPRESSION: 1. Support apparatus, as above. 2. The appearance of the chest is  concerning for multilobar bilateral pneumonia. 3. Small left pleural effusion. Electronically Signed   By: Trudie Reed M.D.   On: 10/06/2020 18:16   ECHOCARDIOGRAM COMPLETE  Result Date: 10/28/2020    ECHOCARDIOGRAM REPORT   Patient Name:   Travis Mayo Date of Exam: 10-28-20 Medical Rec #:  [id number]      Height:       70.0 in Accession #:  [id number]     Weight:       243.8 lb Date of Birth:  1960-11-30      BSA:          2.271 m Patient Age:    59 years       BP:           114/85 mmHg Patient Gender: M              HR:           124 bpm. Exam Location:  Inpatient Procedure: 2D Echo, Cardiac Doppler and Color Doppler Indications:    Stroke  History:        Patient has prior history of Echocardiogram examinations, most                 recent 05/17/2020.  Sonographer:    Roosvelt Maser RDCS Referring Phys: [phone] MCNEILL P KIRKPATRICK IMPRESSIONS  1. Limited acoustic windows and LV endocardium incompletely visualized. Left ventricular ejection fraction, by estimation, is 55 to 60%. The left ventricle has normal function. The left ventricle has no regional wall motion abnormalities. There is mild concentric left ventricular hypertrophy. Left ventricular diastolic parameters are consistent with Grade I diastolic dysfunction (impaired relaxation).  2. Right ventricular systolic function is normal. The right ventricular size is normal.  3. Left atrial size was mildly dilated.  4. The mitral valve is abnormal with moderate leaflet thicking and calcification. There is moderate mitral annular calcification. Mild mitral valve regurgitation.  5. The aortic valve is calcified. There is moderate calcification of the aortic valve. There is moderate thickening of the aortic valve. Aortic valve regurgitation is not visualized. Mild aortic valve stenosis.  6. Aortic dilatation noted. There is mild dilatation of the aortic root, measuring 39 mm. There is mild dilatation of the ascending aorta, measuring 40 mm.  Comparison(s): No significant change from prior study. Conclusion(s)/Recommendation(s): No intracardiac source of embolism detected on this transthoracic study. A transesophageal echocardiogram is recommended to exclude cardiac source of embolism if clinically indicated. FINDINGS  Left Ventricle: Limited acoustic windows and LV endocardium incompletely visualized. Left ventricular ejection fraction, by estimation, is 55 to 60%. The left ventricle has normal function. The left ventricle has no regional wall motion abnormalities. The left ventricular internal cavity size was normal in size. There is mild concentric left ventricular hypertrophy. Left ventricular diastolic parameters are consistent with Grade I diastolic dysfunction (impaired relaxation). Right Ventricle: The right ventricular size is normal. Right vetricular wall thickness was not well visualized. Right ventricular systolic function is normal. Left Atrium: Left atrial size was mildly dilated. Right Atrium: Right atrial size was normal in size. Pericardium: There is no evidence of pericardial effusion. Mitral Valve: The mitral valve is abnormal. There is moderate thickening of the mitral valve leaflet(s). There is moderate calcification of the mitral valve leaflet(s). Moderate mitral annular calcification. Mild mitral valve regurgitation. Tricuspid Valve: The tricuspid valve is normal in structure. Tricuspid valve regurgitation is trivial. Aortic Valve: The aortic valve is calcified. There is moderate calcification of the aortic valve. There is moderate thickening of the aortic valve. Aortic valve regurgitation is not visualized. Mild aortic stenosis is present. Aortic valve mean gradient measures 11.0 mmHg. Aortic valve peak gradient measures 18.8 mmHg. Aortic valve area, by VTI measures 1.48 cm. Pulmonic Valve: The pulmonic valve was grossly normal. Pulmonic valve regurgitation is not visualized. Aorta: Aortic dilatation noted. There is mild  dilatation of the aortic root, measuring 39 mm. There is  mild dilatation of the ascending aorta, measuring 40 mm. Venous: IVC assessment for right atrial pressure unable to be performed due to mechanical ventilation. IAS/Shunts: No atrial level shunt detected by color flow Doppler.  LEFT VENTRICLE PLAX 2D LVIDd:         4.40 cm LVIDs:         3.20 cm LV PW:         0.90 cm LV IVS:        1.00 cm LVOT diam:     2.00 cm LV SV:         41 LV SV Index:   18 LVOT Area:     3.14 cm  LV Volumes (MOD) LV vol d, MOD A4C: 98.5 ml LV vol s, MOD A4C: 49.7 ml LV SV MOD A4C:     98.5 ml RIGHT VENTRICLE          IVC RV Basal diam:  2.70 cm  IVC diam: 2.10 cm LEFT ATRIUM             Index       RIGHT ATRIUM           Index LA diam:        5.50 cm 2.42 cm/m  RA Area:     20.20 cm LA Vol (A2C):   77.8 ml 34.26 ml/m RA Volume:   54.20 ml  23.87 ml/m LA Vol (A4C):   91.6 ml 40.34 ml/m LA Biplane Vol: 88.1 ml 38.80 ml/m  AORTIC VALVE AV Area (Vmax):    1.48 cm AV Area (Vmean):   1.28 cm AV Area (VTI):     1.48 cm AV Vmax:           217.00 cm/s AV Vmean:          155.000 cm/s AV VTI:            0.278 m AV Peak Grad:      18.8 mmHg AV Mean Grad:      11.0 mmHg LVOT Vmax:         102.00 cm/s LVOT Vmean:        63.000 cm/s LVOT VTI:          0.131 m LVOT/AV VTI ratio: 0.47  AORTA Ao Root diam: 3.90 cm Ao Asc diam:  4.00 cm  SHUNTS Systemic VTI:  0.13 m Systemic Diam: 2.00 cm Laurance Flatten MD Electronically signed by Laurance Flatten MD Signature Date/Time: 10/27/2020/11:48:46 AM    Final    CT HEAD CODE STROKE WO CONTRAST  Result Date: 11-15-2020 CLINICAL DATA:  Code stroke.  Neuro deficit, acute, stroke suspected EXAM: CT HEAD WITHOUT CONTRAST TECHNIQUE: Contiguous axial images were obtained from the base of the skull through the vertex without intravenous contrast. COMPARISON:  None. FINDINGS: Brain: 1.0 cm right frontal hemorrhagic focus with mild peripheral edema. Ill-defined hypodense region involving the inferior  right frontal lobe may be artifactual. No mass lesion. No midline shift, ventriculomegaly or extra-axial fluid collection. Vascular: No hyperdense vessel or unexpected calcification. Skull: No acute finding. Sinuses/Orbits: No acute orbital finding. Minimal maxillary sinus mucosal thickening. Other: None. ASPECTS [facility] Stroke Program Early CT Score) - Ganglionic level infarction (caudate, lentiform nuclei, internal capsule, insula, M1-M3 cortex): 7 - Supraganglionic infarction (M4-M6 cortex): 3 Total score (0-10 with 10 being normal): 10 IMPRESSION: 1. 1.0 cm right frontal hemorrhagic focus. Consider MRI to exclude underlying lesion. 2. Ill-defined inferior right frontal hypodensity may reflect artifact versus subacute insult. 3. ASPECTS is  10 Code stroke imaging results were communicated on Nov 16, 2020 at 4:38 pm to provider Dr. Amada Jupiter via telephone, who verbally acknowledged these results. Electronically Signed   By: Stana Bunting M.D.   On: 2020/11/16 16:46    Microbiology Recent Results (from the past 240 hour(s))  Blood culture (routine x 2)     Status: Abnormal   Collection Time: 11/16/20  4:32 PM   Specimen: BLOOD  Result Value Ref Range Status   Specimen Description BLOOD RIGHT ANTECUBITAL  Final   Special Requests   Final    BOTTLES DRAWN AEROBIC AND ANAEROBIC Blood Culture adequate volume   Culture  Setup Time   Final    GRAM POSITIVE COCCI IN BOTH AEROBIC AND ANAEROBIC BOTTLES CRITICAL RESULT CALLED TO, READ BACK BY AND VERIFIED WITH: Performed at [facility], [address], [place], [place] [address]    Culture STAPHYLOCOCCUS AUREUS (A)  Final   Report Status 10/26/2020 FINAL  Final   Organism ID, Bacteria STAPHYLOCOCCUS AUREUS  Final      Susceptibility   Staphylococcus aureus - MIC*    CIPROFLOXACIN <=0.5 SENSITIVE Sensitive     ERYTHROMYCIN <=0.25 SENSITIVE Sensitive     GENTAMICIN <=0.5 SENSITIVE Sensitive     OXACILLIN 0.5 SENSITIVE Sensitive      TETRACYCLINE <=1 SENSITIVE Sensitive     VANCOMYCIN <=0.5 SENSITIVE Sensitive     TRIMETH/SULFA <=10 SENSITIVE Sensitive     CLINDAMYCIN <=0.25 SENSITIVE Sensitive     RIFAMPIN <=0.5 SENSITIVE Sensitive     Inducible Clindamycin NEGATIVE Sensitive     * STAPHYLOCOCCUS AUREUS  Blood Culture ID Panel (Reflexed)     Status: Abnormal   Collection Time: 11/16/20  4:32 PM  Result Value Ref Range Status   Enterococcus faecalis NOT DETECTED NOT DETECTED Final   Enterococcus Faecium NOT DETECTED NOT DETECTED Final   Listeria monocytogenes NOT DETECTED NOT DETECTED Final   Staphylococcus species DETECTED (A) NOT DETECTED Final    Comment: CRITICAL RESULT CALLED TO, READ BACK BY AND VERIFIED WITH:   Staphylococcus aureus (BCID) DETECTED (A) NOT DETECTED Final    Comment: CRITICAL RESULT CALLED TO, READ BACK BY AND VERIFIED WITH: PHARMD BRYCK VERANDA BY MESSAN HOUEGNIFIO AT 0558 ON 10/28/2020    Staphylococcus epidermidis NOT DETECTED NOT DETECTED Final   Staphylococcus lugdunensis NOT DETECTED NOT DETECTED Final   Streptococcus species NOT DETECTED NOT DETECTED Final   Streptococcus agalactiae NOT DETECTED NOT DETECTED Final   Streptococcus pneumoniae NOT DETECTED NOT DETECTED Final   Streptococcus pyogenes NOT DETECTED NOT DETECTED Final   A.calcoaceticus-baumannii NOT DETECTED NOT DETECTED Final   Bacteroides fragilis NOT DETECTED NOT DETECTED Final   Enterobacterales NOT DETECTED NOT DETECTED Final   Enterobacter cloacae complex NOT DETECTED NOT DETECTED Final   Escherichia coli NOT DETECTED NOT DETECTED Final   Klebsiella aerogenes NOT DETECTED NOT DETECTED Final   Klebsiella oxytoca NOT DETECTED NOT DETECTED Final   Klebsiella pneumoniae NOT DETECTED NOT DETECTED Final   Proteus species NOT DETECTED NOT DETECTED Final   Salmonella species NOT DETECTED NOT DETECTED Final   Serratia marcescens NOT DETECTED NOT DETECTED Final   Haemophilus influenzae NOT DETECTED NOT DETECTED Final    Neisseria meningitidis NOT DETECTED NOT DETECTED Final   Pseudomonas aeruginosa NOT DETECTED NOT DETECTED Final   Stenotrophomonas maltophilia NOT DETECTED NOT DETECTED Final   Candida albicans NOT DETECTED NOT DETECTED Final   Candida auris NOT DETECTED NOT DETECTED Final   Candida glabrata NOT DETECTED NOT  DETECTED Final   Candida krusei NOT DETECTED NOT DETECTED Final   Candida parapsilosis NOT DETECTED NOT DETECTED Final   Candida tropicalis NOT DETECTED NOT DETECTED Final   Cryptococcus neoformans/gattii NOT DETECTED NOT DETECTED Final   Meth resistant mecA/C and MREJ NOT DETECTED NOT DETECTED Final    Comment: Performed at [facility], [address], [place], [place] [address]  SARS Coronavirus 2 by RT PCR (hospital order, performed in [facility] hospital lab) Nasopharyngeal Nasopharyngeal Swab     Status: None   Collection Time: 2020-10-29  5:25 PM   Specimen: Nasopharyngeal Swab  Result Value Ref Range Status   SARS Coronavirus 2 NEGATIVE NEGATIVE Final    Comment: (NOTE) SARS-CoV-2 target nucleic acids are NOT DETECTED.  The SARS-CoV-2 RNA is generally detectable in upper and lower respiratory specimens during the acute phase of infection. The lowest concentration of SARS-CoV-2 viral copies this assay can detect is 250 copies / mL. A negative result does not preclude SARS-CoV-2 infection and should not be used as the sole basis for treatment or other patient management decisions.  A negative result may occur with improper specimen collection / handling, submission of specimen other than nasopharyngeal swab, presence of viral mutation(s) within the areas targeted by this assay, and inadequate number of viral copies (<250 copies / mL). A negative result must be combined with clinical observations, patient history, and epidemiological information.  Fact Sheet for Patients:   BoilerBrush.com.cy  Fact Sheet for Healthcare  Providers: https://pope.com/  This test is not yet approved or  cleared by the [place] FDA and has been authorized for detection and/or diagnosis of SARS-CoV-2 by FDA under an Emergency Use Authorization (EUA).  This EUA will remain in effect (meaning this test can be used) for the duration of the COVID-19 declaration under Section 564(b)(1) of the Act, 21 U.S.C. section 360bbb-3(b)(1), unless the authorization is terminated or revoked sooner.  Performed at [facility], [address] [place], [place] [address]   MRSA PCR Screening     Status: Abnormal   Collection Time: 29-Oct-2020  8:18 PM   Specimen: Nasal Mucosa; Nasopharyngeal  Result Value Ref Range Status   MRSA by PCR POSITIVE (A) NEGATIVE Final    Comment:        The GeneXpert MRSA Assay (FDA approved for NASAL specimens only), is one component of a comprehensive MRSA colonization surveillance program. It is not intended to diagnose MRSA infection nor to guide or monitor treatment for MRSA infections. RESULT CALLED TO, READ BACK BY AND VERIFIED WITH: SHEPHERD,B RN 10-29-20 AT 2200 SKEEN,P Performed at [facility], [address], [place], [place] [address]   Blood culture (routine x 2)     Status: None (Preliminary result)   Collection Time: October 29, 2020  9:29 PM   Specimen: BLOOD  Result Value Ref Range Status   Specimen Description BLOOD SITE NOT SPECIFIED  Final   Special Requests   Final    BOTTLES DRAWN AEROBIC AND ANAEROBIC Blood Culture adequate volume   Culture   Final    NO GROWTH 3 DAYS Performed at [facility], [address] [place], [place] [address]    Report Status PENDING  Incomplete    Lab Basic Metabolic Panel: Recent Labs  Lab 10/29/2020 1617 2020-10-29 1941 2020-10-29 2329 10/20/2020 0012 10/27/2020 0304 10/16/2020 0319 10/16/2020 0929  NA 138  138   < > 142 152* 141 141 141  K 3.4*  3.4*   < >  3.3* 3.3* 3.3* 3.5 3.2*  CL 108  112*  --   --    --   --  111  --   CO2 12*  --   --   --   --  13*  --   GLUCOSE 165*  155*  --   --   --   --  120*  --   BUN 61*  54*  --   --   --   --  58*  --   CREATININE 3.12*  2.90*  --   --   --   --  3.22*  --   CALCIUM 8.5*  --   --   --   --  7.3*  --   MG  --   --   --   --   --  1.8  --   PHOS  --   --   --   --   --  4.5  --    < > = values in this interval not displayed.   Liver Function Tests: Recent Labs  Lab 10/16/2020 1617 Oct 25, 2020 0319  AST 67* 287*  ALT 62* 167*  ALKPHOS 133* 94  BILITOT 1.1 1.5*  PROT 5.8* 4.3*  ALBUMIN 2.2* 1.8*   Recent Labs  Lab 10/25/2020 0319  LIPASE 50   No results for input(s): AMMONIA in the last 168 hours. CBC: Recent Labs  Lab 10/14/2020 1617 10/26/2020 1941 10/05/2020 2329 Oct 25, 2020 0012 Oct 25, 2020 0304 Oct 25, 2020 0319 25-Oct-2020 0929  WBC 12.8*  --   --   --   --  3.2*  --   NEUTROABS 12.1*  --   --   --   --  3.0  --   HGB 12.7*  13.6   < > 9.9* 8.2* 9.5* 10.1* 10.9*  HCT 39.6  40.0   < > 29.0* 24.0* 28.0* 31.7* 32.0*  MCV 85.7  --   --   --   --  86.6  --   PLT 136*  --   --   --   --  82*  --    < > = values in this interval not displayed.   Cardiac Enzymes: No results for input(s): CKTOTAL, CKMB, CKMBINDEX, TROPONINI in the last 168 hours. Sepsis Labs: Recent Labs  Lab 10/31/2020 1617 10/22/2020 1727 10/06/2020 1931 10/29/2020 2122 10/25/2020 0319 October 25, 2020 0320 10-25-20 1140  WBC 12.8*  --   --   --  3.2*  --   --   LATICACIDVEN  --    < > 2.9* 3.1*  --  3.0* 4.2*   < > = values in this interval not displayed.    Procedures/Operations  1/22 ETT 1/22 OR for ex-lap   Jaielle Dlouhy Mechele Collin 10/27/2020, 8:36 AM

## 2020-11-02 NOTE — Progress Notes (Signed)
I responded to a page from the nurse to provide spiritual support for the patient's family. I visited Travis Mayo's room with his son and daughter present. I shared words of comfort, read from the Scriptures, and led in prayer. I provided spiritual care through pastoral presence.      11-04-2020 1300  Clinical Encounter Type  Visited With Patient and family together  Visit Type Spiritual support  Referral From Nurse  Consult/Referral To Chaplain  Spiritual Encounters  Spiritual Needs Prayer;Emotional  Stress Factors  Patient Stress Factors None identified  Family Stress Factors Exhausted    Chaplain Dr Melvyn Novas

## 2020-11-02 NOTE — Anesthesia Postprocedure Evaluation (Signed)
Anesthesia Post Note  Patient: Travis Mayo  Procedure(s) Performed: EXPLORATORY LAPAROTOMY (N/A Abdomen) APPLICATION OF WOUND VAC WITH DRAIN PLACEMENT X2 (N/A Abdomen)     Patient location during evaluation: ICU Anesthesia Type: General Level of consciousness: sedated and patient remains intubated per anesthesia plan Pain management: pain level controlled Vital Signs Assessment: vitals unstable Respiratory status: patient remains intubated per anesthesia plan Cardiovascular status: unstable (On multiple vasopressors to support MAP) Postop Assessment: no apparent nausea or vomiting Anesthetic complications: no   No complications documented.  Last Vitals:  Vitals:   10/16/2020 1122 10/07/2020 1400  BP: 131/86   Pulse: (!) 123   Resp: (!) 31 13  Temp:  (!) 36.2 C  SpO2: 94%     Last Pain:  Vitals:   10/29/2020 0800  TempSrc: Bladder                 Beryle Lathe

## 2020-11-02 NOTE — Progress Notes (Signed)
PCCM Interval Note  Family reports ready for comfort care. Will place comfort care order set and compassionate extubation.

## 2020-11-02 NOTE — Progress Notes (Signed)
SLP Cancellation Note  Patient Details Name: Travis Mayo MRN: [id number] DOB: 01-09-1961   Cancelled treatment:       Reason Eval/Treat Not Completed: Medical issues which prohibited therapy (Pt is currently on the vent. SLP will follow up.)  Valinda Fedie I. Vear Clock, MS, CCC-SLP Acute Rehabilitation Services Office number [phone] Pager [phone]  Scheryl Marten November 17, 2020, 8:45 AM

## 2020-11-02 NOTE — Op Note (Signed)
Date: 11/22/20  Patient: Travis Mayo MRN: [id number]  Preoperative Diagnosis: Pneumoperitoneum Postoperative Diagnosis: Same  Procedure: Exploratory laparotomy, abdominal washout, intraperitoneal drain placement, placement of negative pressure dressing  Surgeon: Sophronia Simas, MD  EBL: Minimal  Anesthesia: General  Specimens: None  Indications: Travis Mayo is a 60 yo male who was brought to the ED this afternoon with hemiparesis and respiratory distress. He was promptly intubated on arrival and a code stroke was called. Brain MRI showed a large right MCA stroke. The patient also underwent an abdominal CT scan for sepsis workup, which showed pneumoperitoneum in the upper abdomen as well as a gas and fluid collection in the lesser sac. Given the patient's significant stroke, a discussion was had with his family regarding elevated risk of surgery but likely progression to septic shock of abdominal exploration was not pursued. The family felt that the patient would want to take any measures possible to save his life and agreed to proceed with exploratory laparotomy.  Findings: No intraperitoneal free fluid or identifiable hollow viscous injury. Transverse mesocolon was adherent to the head of the pancreas, with an adjacent walled off collection of solid debris and fluid in the lesser sac near the head of the pancreas. Entire colon appeared well-perfused with no perforation identified. No duodenal, gastric or small bowel perforation. Drains x2 placed and abdomen left open with an Abthera dressing.  Procedure details: Informed consent was obtained from the patient's son in the preoperative area prior to the procedure. The patient was brought to the operating room already intubated and placed on the table in the supine position. Appropriate lines and drains were placed for intraoperative monitoring. Perioperative antibiotics were administered per SCIP guidelines. The abdomen was prepped and draped in  the usual sterile fashion. A pre-procedure timeout was taken verifying patient identity, surgical site and procedure to be performed.  An upper midline skin incision was made and the subcutaneous tissue was divided with cautery. The fascia was incised and opened at the linea alba and the peritoneal cavity was entered. There was no free fluid or rush of air on entry into the abdomen. The falciform ligament was taken off the abdominal wall, ligated with 2-0 silk ties and divided. The stomach was grossly normal in appearance with no perforation on the anterior aspect. The proximal duodenum was normal in appearance, although there was significant adjacent inflammation along the head of the pancreas. The duodenum was kocherized to expose the third and fourth portions. The duodenum was well-perfused with no identifiable perforation or injury. The transverse mesocolon was very adherent to the head of the pancreas and this area was very firm on palpation. During kocherization attempts were made to dissect the mesocolon off the head of the pancreas and a walled off pocket of necrotic appearing fluid and debris was entered within the mesocolon and tracking onto the head of the pancreas. This was highly suspicious for pancreatic necrosis vs a transverse colon injury. The transverse colon was carefully inspected and was pink and well-perfused, with no identifiable defects in the colon. I further attempted to mobilize the transverse mesocolon off the head of the pancreas but it was very adherent and I did not want to risk a vascular injury in the case of pancreatic necrosis. The small bowel was run from the ligament of Treitz to the ileocecal valve and there were no injuries. The entire small bowel was pink and well-perfused. The colon and rectum were grossly normal in appearance. The gastrocolic omentum was opened to the  left of the middle colic vessels and the posterior stomach was examined. The proximal posterior stomach  was normal with no defects, but the distal stomach was adherent to the head of pancreas  At this point given the patient was critically ill on multiple pressors, and there were no identifiable hollow viscous injuries. Thus the decision was made to leave a drain with the collection on the head of the pancreas and leave the abdomen open, with planned re-exploration in 24-48 hours. The abdomen was irrigated with several liters of warmed saline. Two 19-Fr round JP drains were placed and brought out through the right abdominal wall. The inferior drain was placed within the lesser sac collection, and the superior drain was placed in the peritoneal space near the duodenum. Both drains were secured to the skin with 2-0 Nylon suture. The fascia was left open and an Abthera dressing was placed and applied to continuous suction with a good seal.  All counts were correct x2 at the end of the procedure. The patient was transported to the ICU intubated and in critical condition at the completion of the procedure.  Sophronia Simas, MD 10/08/2020 2:25 AM

## 2020-11-02 NOTE — Progress Notes (Signed)
STROKE TEAM PROGRESS NOTE   HISTORY OF PRESENT ILLNESS (per record) Travis Mayo is a 60 y.o. male with a PMHx of Charcot's deformity left foot and HTN. Per EMS, patient was last seen well at 1800 hrs 10/22/20 per friend. Patient was texting earlier today with his friend but the friend did not actually see the patient until 1500 or so today and found the patient to be altered. EMS was called at 1519 hrs and patient was found to be lethargic, aphasic, and not following commands and not moving the left side. Pt's respirations were labored and he was very tachycardic. CODE STROKE was called by EMS with concern for sepsis as well.  At the bridge, patient's airway was cleared by ED MD. Patient was able to say his name and was not verbally responsive otherwise, nor was he following commands. He was taken urgently to CT scan where he was found to have ? calcification vs bleeding. Pt's creatinine was 3 so a CTA head and neck could not be performed. Dr. Amada Jupiter spoke with radiologist and it was decided to take patient stat for MRI/MRA brain without contrast.  In CT, patient continued to be lethargic and not answering questions. His LUE flaccid. LLE withdraws to pain but no purposeful movement. He moves his RUE/RLE spontaneously. He did perform right grip testing but no other muscle strength.  Pt then taken to MRI/MRA. Concern for right MCA territory stroke with hemorrhagic conversion.  NP spoke with patient's son, Huston Foley, who is on the way to hospital from [place]. Huston Foley states he would be the one to make decisions for his father. NP informed Huston Foley of the stroke and that we were still doing testing/scans to decide what to do further. Briefly, told Huston Foley he may be contacted for permission for procedure. [place] but we had his permission to do anything to try and keep the patient alive. Son thinks patient is a diabetic.  LKW: 1800 hrs 10/22/20 tpa given?:  No, ? Bleeding associated with ischemia on CTH.  IR Thrombectomy? No, not a candidate.  MRS unknown at this time NIHSS: Total 15  INTERVAL HISTORY I personally reviewed history of presenting illness, electronic medical records and imaging films in PACS.  Patient remains critically ill sedated intubated and requiring hemodynamic and ventilatory support.  Neurological exam limited due to sedation.  He has gram-positive bacteremia and likely endocarditis causing stroke.  MRI scan shows right middle cerebral artery infarct with sparing of the basal ganglia and MRA shows distal right middle cerebral artery as well as left M2 MCA as well as focal basilar artery stenosis as well.  Echocardiogram is pending but report from transthoracic echo done in August 2019 had raised concern for endocarditis.  He had laparotomy yesterday for pneumoperitoneum but no clear perforated viscus was found and plan is to reconsider surgery in the next few days.    OBJECTIVE Vitals:   10/25/2020 0615 10/22/2020 0700 10/27/2020 0730 10/11/2020 0800  BP:  121/80  115/79  Pulse: (!) 118 (!) 118 (!) 119 (!) 111  Resp: (!) 25 (!) 25 (!) 25 (!) 25  Temp: 98.96 F (37.2 C) 98.78 F (37.1 C) 98.78 F (37.1 C) 98.6 F (37 C)  TempSrc:      SpO2: 98% 97% 97% 97%  Weight:      Height:        CBC:  Recent Labs  Lab 10-27-20 1617 2020-10-27 1941 10/22/2020 0304 10/07/2020 0319  WBC 12.8*  --   --  3.2*  NEUTROABS 12.1*  --   --  3.0  HGB 12.7*  13.6   < > 9.5* 10.1*  HCT 39.6  40.0   < > 28.0* 31.7*  MCV 85.7  --   --  86.6  PLT 136*  --   --  82*   < > = values in this interval not displayed.    Basic Metabolic Panel:  Recent Labs  Lab 10/21/2020 1617 10/06/2020 1941 11/08/2020 0304 11/08/20 0319  NA 138  138   < > 141 141  K 3.4*  3.4*   < > 3.3* 3.5  CL 108  112*  --   --  111  CO2 12*  --   --  13*  GLUCOSE 165*  155*  --   --  120*  BUN 61*  54*  --   --  58*  CREATININE 3.12*  2.90*  --   --  3.22*  CALCIUM  8.5*  --   --  7.3*  MG  --   --   --  1.8  PHOS  --   --   --  4.5   < > = values in this interval not displayed.    Lipid Panel:     Component Value Date/Time   CHOL 113 08-Nov-2020 0319   TRIG 211 (H) 2020-11-08 0319   HDL <10 (L) 2020-11-08 0319   CHOLHDL NOT CALCULATED 11/08/20 0319   VLDL 42 (H) 11/08/2020 0319   LDLCALC 29 11-08-20 0319   HgbA1c:  Lab Results  Component Value Date   HGBA1C 5.9 (H) 10/29/2020   Urine Drug Screen:     Component Value Date/Time   LABOPIA NONE DETECTED 10/06/2020 1931   COCAINSCRNUR NONE DETECTED 10/08/2020 1931   LABBENZ NONE DETECTED 10/25/2020 1931   AMPHETMU NONE DETECTED 11/01/2020 1931   THCU NONE DETECTED 10/04/2020 1931   LABBARB NONE DETECTED 10/25/2020 1931    Alcohol Level     Component Value Date/Time   ETH <10 10/11/2020 1617    IMAGING  CT HEAD CODE STROKE WO CONTRAST 10/10/2020 IMPRESSION:  1. 1.0 cm right frontal hemorrhagic focus. Consider MRI to exclude underlying lesion.  2. Ill-defined inferior right frontal hypodensity may reflect artifact versus subacute insult.  3. ASPECTS is 10   CT HEAD WO CONTRAST 08-Nov-2020 IMPRESSION: Known acute infarcts most notably affecting the right MCA territory. No midline shift or progressive petechial hemorrhage.  MR ANGIO HEAD WO CONTRAST 10/04/2020   IMPRESSION:  Motion degradation limits evaluation. Moderate to severe right M1 segment narrowing with severely diminished opacification distally. Moderate to severe narrowing of the distal basilar artery with distal reconstitution. Mild-to-moderate multifocal left M2 segment narrowing. Multifocal bilateral PCA narrowing with mild irregularity may reflect sequela of atheromatous disease versus vasculitis.   MR BRAIN WO CONTRAST 10/14/2020 IMPRESSION:  Sequela of acute right MCA territory infarct with 1.1 cm frontal hemorrhagic component. Smaller infarcts involving the left frontal and occipital regions. Right perisylvian and  left frontal petechial hemorrhages. 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 Motion artifact and limited sequence acquisition limits evaluation.   CT ABDOMEN PELVIS WO CONTRAST 10/14/2020 IMPRESSION:  1. Extensive pneumoperitoneum within the central upper abdomen and right upper quadrant. Based on location of free gas as well as a gas/fluid collection, favor perforated duodenal ulcer over gastric ulcer or colonic perforation. Surgical consultation is recommended.  2. Left inguinal  hernia containing a portion of sigmoid colon. No bowel obstruction or strangulation.  3. Small hiatal hernia, containing a small amount of pneumoperitoneum.  4. Gallbladder sludge without cholelithiasis or cholecystitis.   DG Chest Port 1 View 10/25/2020 IMPRESSION:  1. Support apparatus, as above.  2. The appearance of the chest is concerning for multilobar bilateral pneumonia.  3. Small left pleural effusion.    DG Chest Port 1 View July 21, 2021 IMPRESSION: 1. New right IJ central venous catheter tip projects over the superior vena cava. 2. Low lung volumes with scattered opacities which may represent infection or atelectasis.  Transthoracic Echocardiogram  00/00/2021 Pending  ECG - ST rate 138 BPM. (See cardiology reading for complete details)  PHYSICAL EXAM Blood pressure 115/79, pulse (!) 111, temperature 98.6 F (37 C), resp. rate (!) 25, height 5\' 10"  (1.778 m), weight 110.6 kg, SpO2 97 %. Obese middle-aged Caucasian male sedated intubated.  Open surgical wound from recent laparotomy midline belly.  Bandage on left foot from his chronic wound infection. . Afebrile. Head is nontraumatic. Neck is supple without bruit.    Cardiac exam no murmur or gallop. Lungs are clear to auscultation. Distal pulses are well felt. Neurological Exam :  Patient is sedated and intubated.  Eyes are closed.  Responds to sternal rub with partial opening of his eyes.  There is tonic  upgaze deviation.  Pupils are both 3 to 4 mm and reactive.  Corneal reflexes are preserved bilaterally.  Doll's eye movements are preserved but sluggish.  Fundi not visualized.  Tongue is midline.  He has a weak cough and gag.  Motor system exam no significant withdrawal response in all 4 extremities to sternal rub.  No response to painful stimuli.  Plantars multifocal.           ASSESSMENT/PLAN Mr. Areatha KeasJeffrey Hege is a 60 y.o. male with history of Charcot's deformity left foot, HTN, Etoh abuse, ongoing tobacco use and possible diabetes presenting with lethargy, aphasia, not following commands, left hemiplegia, tachycardia and tachypnea with possible sepsis. He did not receive IV t-PA due to Bleeding associated with ischemia on CTH.  Stroke: acute right MCA territory infarct with 1.1 cm frontal hemorrhagic component. Smaller infarcts involving the left frontal and occipital regions- embolic - source strong suspicion for bacterial endocarditis given his clinical situation.  Resultant unresponsive state with suspected left hemiparesis but exam limited by sedation.    Code Stroke CT Head - 1.0 cm right frontal hemorrhagic focus. Consider MRI to exclude underlying lesion. Ill-defined inferior right frontal hypodensity may reflect artifact versus subacute insult. ASPECTS is 10   CT head - Known acute infarcts most notably affecting the right MCA territory. No midline shift or progressive petechial hemorrhage.  MRI head - Sequela of acute right MCA territory infarct with 1.1 cm frontal hemorrhagic component. Smaller infarcts involving the left frontal and occipital regions. Right perisylvian and left frontal petechial hemorrhages. 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  MRA head - Motion degradation limits evaluation. Moderate to severe right M1 segment narrowing with severely diminished opacification distally. Moderate to severe narrowing of the  distal basilar artery with distal reconstitution. Mild-to-moderate multifocal left M2 segment narrowing. Multifocal bilateral PCA narrowing with mild irregularity may reflect sequela of atheromatous disease versus vasculitis.  CTA H&N - not ordered  CT Perfusion - not ordered  Carotid Doppler - not ordered  2D Echo - pending  Sars Corona Virus 2 - negative  LDL -  29  HgbA1c - 5.9  UDS - negative  VTE prophylaxis - SCDs Diet  Diet Order            Diet NPO time specified  Diet effective now                 No antithrombotic prior to admission, now on No antithrombotic  Ongoing aggressive stroke risk factor management  Therapy recommendations:  pending  Disposition:  Pending  Hypertension  Home BP meds: Zestril  Current BP meds: Neosynephrine, Levophed, Vasopressin  Stable on multiple pressors  . Systolic blood pressure goal < 160 mm Hg . Long-term BP goal normotensive  Hyperlipidemia  Home Lipid lowering medication: Omega 3 fish oil  LDL 29, goal < 70  Current lipid lowering medication: none   Continue statin at discharge  Diabetes - suspected  Home diabetic meds: none   Current diabetic meds: SSI   HgbA1c 5.9, goal < 7.0 Recent Labs    10/07/2020 0745 10/05/2020 0832 10/18/2020 0846  GLUCAP 69* 109* 98    Other Stroke Risk Factors  Cigarette smoker - advised to stop smoking  ETOH use, advised to drink no more than 1 alcoholic beverage per day.  Obesity, Body mass index is 34.99 kg/m., recommend weight loss, diet and exercise as appropriate   Family hx stroke - not on file  Other Active Problems, Findings, Recommendations and/or Plan  Code status - Full code  Sepsis - Maxipime, Vancomycin and Metronidazole - ID consulted  Tachycardia  AKI - creatinine - 3.22  WBC's - 12.8->3.2  Thrombocytopenia - platelets - 136->82  Hypokalemia - corrected   Possible multilobar bilateral pneumonia   Anemia - Hgb -  13.6->9.5->10.1  Pneumoperitoneum - Exploratory laparotomy 10/28/2020 - necrotizing pancreatitis - perforated bowel - further surgery planned  Hospital day # 1 Patient has presented with large right MCA and small embolic left hemispheric infarcts etiology likely related to bacterial endocarditis given his longstanding ongoing staphylococcal infection and previous echo suggesting possibility of endocarditis.  Continue antibiotics as per ID team and follow 2D echo results if they are not conclusive may need TEE if family wishes to pursue aggressive care.  Given his poor general medical condition discussion about goals of care and palliative care approach with family may be appropriate.  Long discussion with Dr. Everardo All critical care medicine and answered questions. This patient is critically ill and at significant risk of neurological worsening, death and care requires constant monitoring of vital signs, hemodynamics,respiratory and cardiac monitoring, extensive review of multiple databases, frequent neurological assessment, discussion with family, other specialists and medical decision making of high complexity.I have made any additions or clarifications directly to the above note.This critical care time does not reflect procedure time, or teaching time or supervisory time of PA/NP/Med Resident etc but could involve care discussion time.  I spent 35 minutes of neurocritical care time  in the care of  this patient.  Delia Heady, MD   To contact Stroke Continuity provider, please refer to [email or web address]. After hours, contact General Neurology

## 2020-11-02 NOTE — Progress Notes (Signed)
Hypoglycemic Event  CBG: 0745-69  Treatment: D50 50 mL (25 gm)  Symptoms: None  Follow-up CBG: [id number] CBG Result:109  Possible Reasons for Event: Inadequate meal intake  Comments/MD notified:Dr Everardo All.  D10 ordered.    Erick Blinks

## 2020-11-02 NOTE — Progress Notes (Signed)
PT Cancellation Note  Patient Details Name: Travis Mayo MRN: [id number] DOB: 26-Dec-1960   Cancelled Treatment:    Reason Eval/Treat Not Completed: Patient not medically ready   Orders received, and cart reviewed;  Noted [name] is sedated on the vent post exp lap;   Will hold today, and follow along for appropriateness of PT evaluation;   Van Clines, PT  Acute Rehabilitation Services Pager [phone] Office [phone]    Levi Aland 28-Oct-2020, 8:06 AM

## 2020-11-02 NOTE — Progress Notes (Signed)
Pt terminally extubated to room air. No issues encountered.

## 2020-11-02 NOTE — Progress Notes (Signed)
eLink Physician-Brief Progress Note Patient Name: Travis Mayo DOB: 05/14/1961 MRN: [id number]   Date of Service  11/19/20  HPI/Events of Note  Norepinephrine IV infusion and Vasopressin IV infusions started in OR. No orders for either.  eICU Interventions  Plan: 1. Vaspressin IV infusion at shock dose. 2. Norepinephrine IV infusion. Titrate to MAP >= 80 given CVA.  3. Monitor CVP now and Q 4 hours.      Intervention Category Major Interventions: Hypotension - evaluation and management  Kailan Carmen Eugene Nov 19, 2020, 2:10 AM

## 2020-11-02 NOTE — Progress Notes (Addendum)
PCCM Interval Note  Updated son and daughter at bedside regarding patient's clinical condition and poor prognosis. Patient is requiring significant support including near max vasopressors.  At this time they wish to wait on the arrival of their last sibling to discuss GOC of care. However if we were max out on medications before his arrival and if patient were not to respond to current therapy, family would consider transitioning to comfort care.  Family discussed and agreed to transition patient to DNR for now  Mechele Collin, M.D. [facility] Pulmonary/Critical Care Medicine 10/16/2020 11:54 AM

## 2020-11-02 NOTE — Progress Notes (Signed)
eLink Physician-Brief Progress Note Patient Name: Travis Mayo DOB: [birth year] MRN: [id number]   Date of Service  10/26/2020  HPI/Events of Note  ABG on 100%/PRVC 25/TV 580/P 5 = 7.258/31.0/175/14. CVP = 18.  eICU Interventions  Plan: 1. Increase NaHCO3 IV infusion rate to 100.  2. Repeat ABG at 5 AM.     Intervention Category Major Interventions: Other:;Acid-Base disturbance - evaluation and management;Respiratory failure - evaluation and management  Yaron Grasse Dennard Nip 10/20/2020, 3:29 AM

## 2020-11-02 NOTE — Progress Notes (Signed)
  Echocardiogram 2D Echocardiogram has been performed.  Travis Mayo F 10/07/2020, 10:30 AM

## 2020-11-02 NOTE — Transfer of Care (Signed)
Immediate Anesthesia Transfer of Care Note  Patient: Travis Mayo  Procedure(s) Performed: EXPLORATORY LAPAROTOMY (N/A Abdomen) APPLICATION OF WOUND VAC WITH DRAIN PLACEMENT X2 (N/A Abdomen)  Patient Location: ICU  Anesthesia Type:General  Level of Consciousness: Patient remains intubated per anesthesia plan  Airway & Oxygen Therapy: Patient remains intubated per anesthesia plan and Patient placed on Ventilator (see vital sign flow sheet for setting)  Post-op Assessment: Report given to RN and Post -op Vital signs reviewed and stable  Post vital signs: Reviewed and unstable  Last Vitals:  Vitals Value Taken Time  BP 91/58 11/17/2020 0104  Temp    Pulse 105 17-Nov-2020 0111  Resp 25 2020-11-17 0117  SpO2  79% (Last PO2 179 mmHg; ABGs obtained d/t inability to obtain accurate pleth waveform). 2020/11/17 0111  Vitals shown include unvalidated device data.  Last Pain:  Vitals:   10/05/2020 2030  TempSrc: Oral     Report to [facility] and Will RT in 22M ICU. Patient applied to 100% FiO2 noting previous ABG values, Neo gtt weaned in OR, currently on propofol, levophed, vasopressin and bicarb infusions, Arterial line and central line maintained, foley maintained, minimal urine output throughout procedure, full report given, questions answered.    Complications: No complications documented.

## 2020-11-02 NOTE — Progress Notes (Addendum)
Pt expired at 1524 surrounded by family, 2 sons and 1 daughter.  This RN and Priscella Mann RN auscultated for 1 full minute for heart sounds with no sounds heard.  Dr.  Everardo All notified.  Referral made to [facility].  Erick Blinks, RN

## 2020-11-02 NOTE — Progress Notes (Signed)
PHARMACY - PHYSICIAN COMMUNICATION CRITICAL VALUE ALERT - BLOOD CULTURE IDENTIFICATION (BCID)  Travis Mayo is an 60 y.o. male who presented to [facility] on 10/12/2020 after being found down by a friend.  Assessment:  Pt admitted for acute cardioembolic stroke with hemorrhagic conversion as well as sepsis d/t perforated bowel, started on broad-spectrum ABX, now growing MSSA in 2 of 4 blood cx bottles.  Name of physician (or Provider) Contacted: SSommer  Current antibiotics: vancomycin, cefepime, metronidazole  Changes to prescribed antibiotics recommended:  No change for now given critical illness, consider narrowing to Ancef when more stable.  Results for orders placed or performed during the hospital encounter of 11/01/2020  Blood Culture ID Panel (Reflexed) (Collected: 10/15/2020  4:32 PM)  Result Value Ref Range   Enterococcus faecalis NOT DETECTED NOT DETECTED   Enterococcus Faecium NOT DETECTED NOT DETECTED   Listeria monocytogenes NOT DETECTED NOT DETECTED   Staphylococcus species DETECTED (A) NOT DETECTED   Staphylococcus aureus (BCID) DETECTED (A) NOT DETECTED   Staphylococcus epidermidis NOT DETECTED NOT DETECTED   Staphylococcus lugdunensis NOT DETECTED NOT DETECTED   Streptococcus species NOT DETECTED NOT DETECTED   Streptococcus agalactiae NOT DETECTED NOT DETECTED   Streptococcus pneumoniae NOT DETECTED NOT DETECTED   Streptococcus pyogenes NOT DETECTED NOT DETECTED   A.calcoaceticus-baumannii NOT DETECTED NOT DETECTED   Bacteroides fragilis NOT DETECTED NOT DETECTED   Enterobacterales NOT DETECTED NOT DETECTED   Enterobacter cloacae complex NOT DETECTED NOT DETECTED   Escherichia coli NOT DETECTED NOT DETECTED   Klebsiella aerogenes NOT DETECTED NOT DETECTED   Klebsiella oxytoca NOT DETECTED NOT DETECTED   Klebsiella pneumoniae NOT DETECTED NOT DETECTED   Proteus species NOT DETECTED NOT DETECTED   Salmonella species NOT DETECTED NOT DETECTED   Serratia marcescens  NOT DETECTED NOT DETECTED   Haemophilus influenzae NOT DETECTED NOT DETECTED   Neisseria meningitidis NOT DETECTED NOT DETECTED   Pseudomonas aeruginosa NOT DETECTED NOT DETECTED   Stenotrophomonas maltophilia NOT DETECTED NOT DETECTED   Candida albicans NOT DETECTED NOT DETECTED   Candida auris NOT DETECTED NOT DETECTED   Candida glabrata NOT DETECTED NOT DETECTED   Candida krusei NOT DETECTED NOT DETECTED   Candida parapsilosis NOT DETECTED NOT DETECTED   Candida tropicalis NOT DETECTED NOT DETECTED   Cryptococcus neoformans/gattii NOT DETECTED NOT DETECTED   Meth resistant mecA/C and MREJ NOT DETECTED NOT DETECTED    Vernard Gambles, PharmD, BCPS  11/04/2020  6:07 AM

## 2020-11-02 NOTE — Anesthesia Procedure Notes (Signed)
Central Venous Catheter Insertion Performed by: Beryle Lathe, MD, anesthesiologist Start/End01/12/2020 11:31 PM, 10/26/2020 11:37 PM Patient location: OR. Emergency situation Preanesthetic checklist: patient identified, IV checked, monitors and equipment checked, pre-op evaluation and timeout performed Position: Trendelenburg Patient sedated Hand hygiene performed , maximum sterile barriers used  and Seldinger technique used Catheter size: 12 Fr Central line was placed.Triple lumen Procedure performed using ultrasound guided technique. Ultrasound Notes:anatomy identified, needle tip was noted to be adjacent to the nerve/plexus identified, no ultrasound evidence of intravascular and/or intraneural injection and image(s) printed for medical record Attempts: 1 Following insertion, line sutured, dressing applied and Biopatch. Post procedure assessment: blood return through all ports, free fluid flow and no air  Patient tolerated the procedure well with no immediate complications.

## 2020-11-02 DEATH — deceased

## 2021-08-20 IMAGING — DX DG CHEST 1V PORT
1 series · 1 of 1 positions shown · non-contrast
Comparison: Chest radiograph 10/23/2020

CLINICAL DATA: Respiratory failure.

EXAM:
PORTABLE CHEST 1 VIEW

[chest ap]
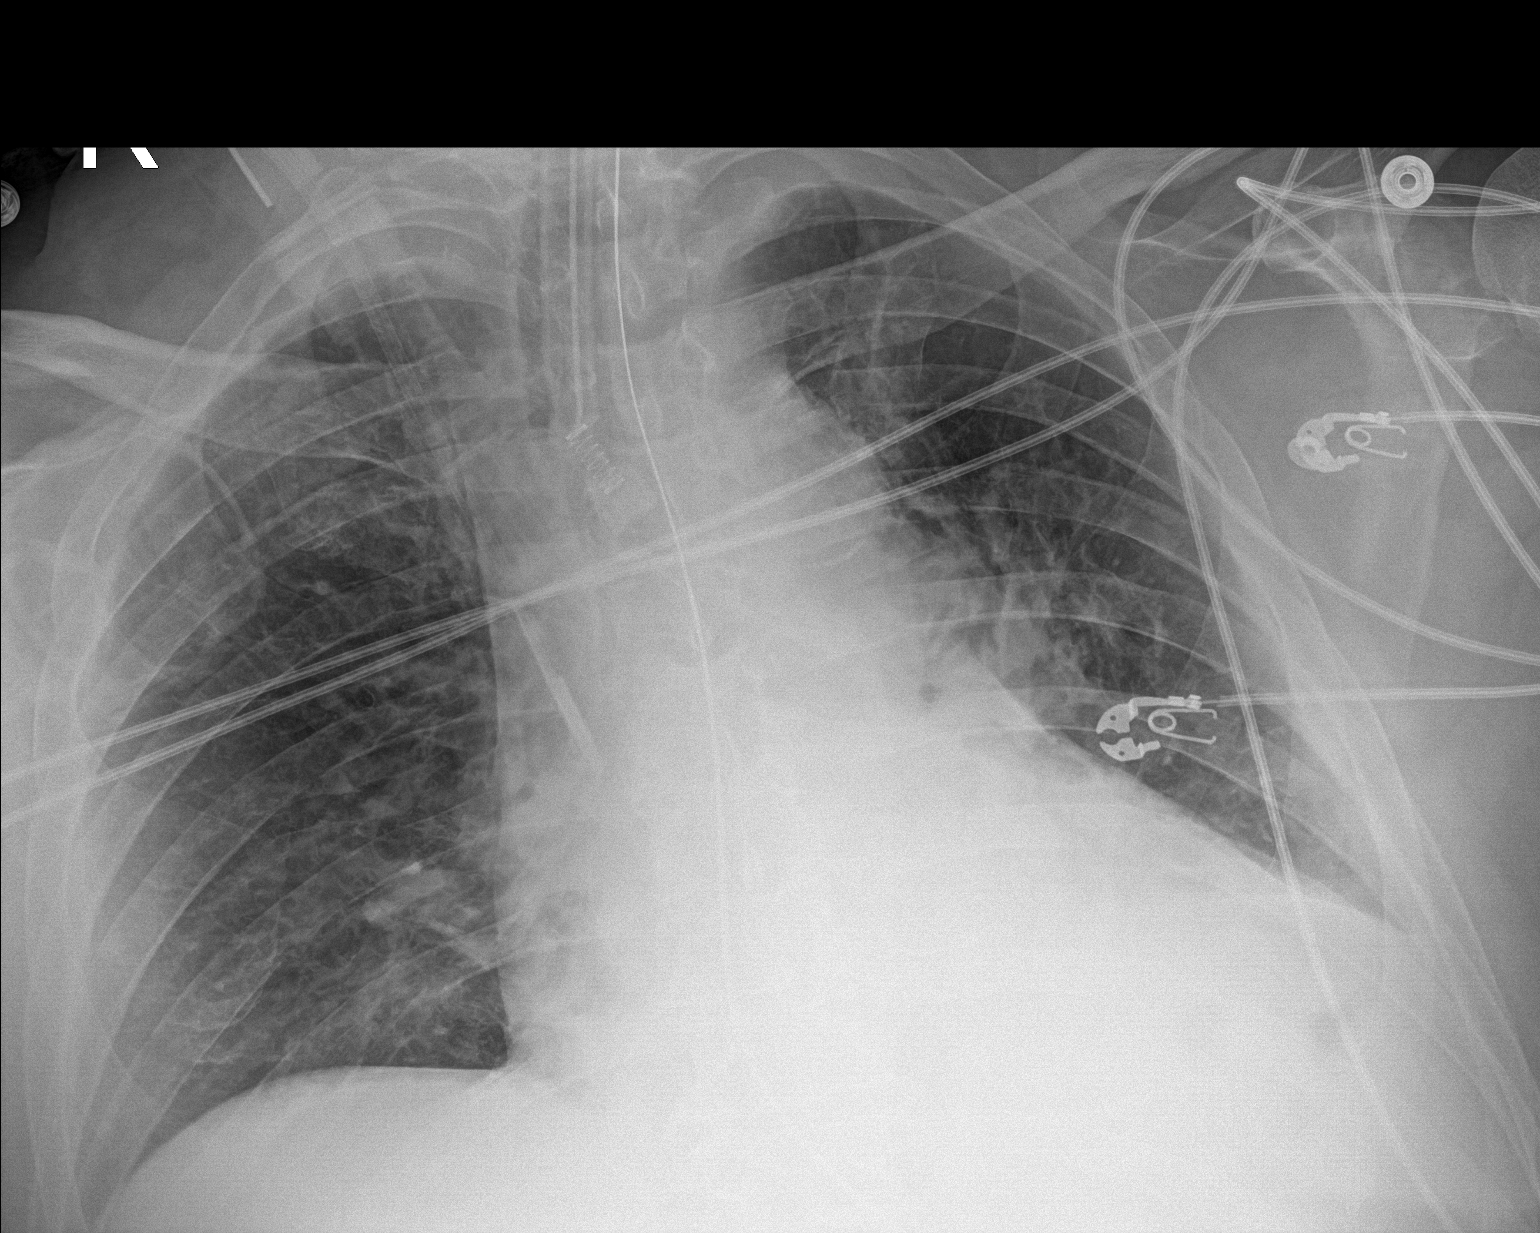

[1 of 1 positions shown; findings below may reference images not displayed]

FINDINGS: ET tube mid trachea. Enteric tube courses inferior to the diaphragm.
Monitoring leads overlie the patient. Stable cardiac and mediastinal
contours. New right IJ central venous catheter tip projects over the
superior vena cava. Low lung volumes. Scattered opacities
bilaterally. Possible small left pleural effusion. No pneumothorax.
IMPRESSION: 1. New right IJ central venous catheter tip projects over the
superior vena cava.
2. Low lung volumes with scattered opacities which may represent
infection or atelectasis.

## 2021-08-20 IMAGING — CT CT HEAD W/O CM
4 series · 16 of 47 positions shown, 18 images · non-contrast
Comparison: Brain MRI from yesterday

CLINICAL DATA: Stroke follow-up

EXAM:
CT HEAD WITHOUT CONTRAST
TECHNIQUE: Contiguous axial images were obtained from the base of the skull
through the vertex without intravenous contrast.

[Series 3: head without · axial · non-contrast · 0.49mm/px · z∈[-468,-344]mm · 7 of 35 slices shown, 9 images]
[im 5/35  brain]
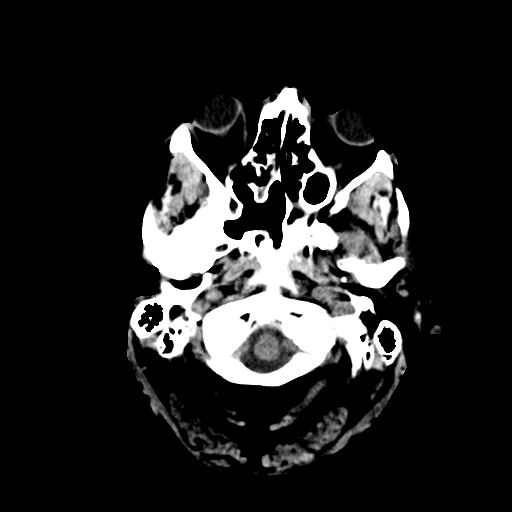
[im 5/35  bone]
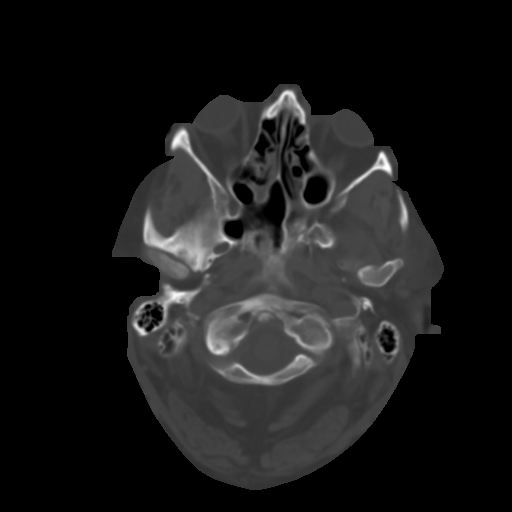
[im 9/35  brain]
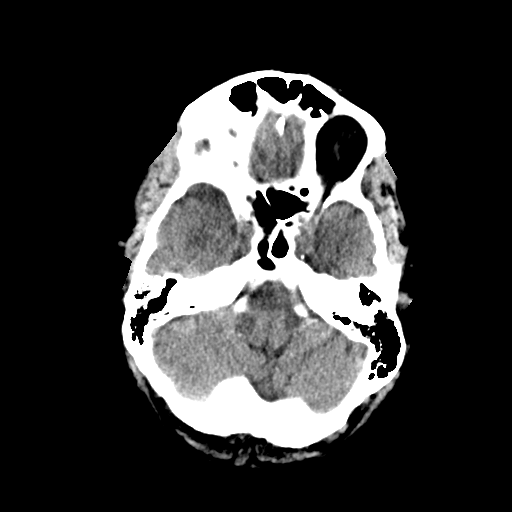
[im 13/35  brain]
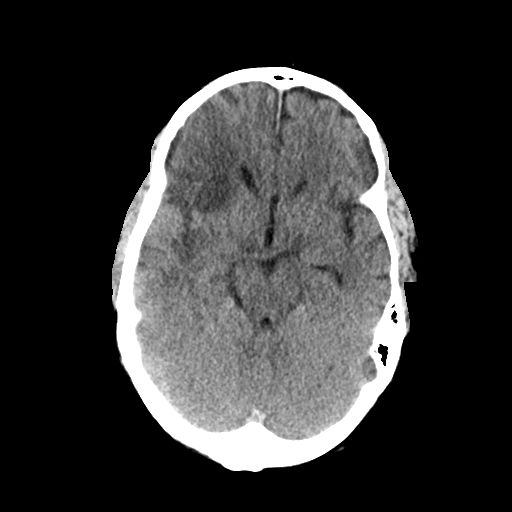
[im 18/35  brain]
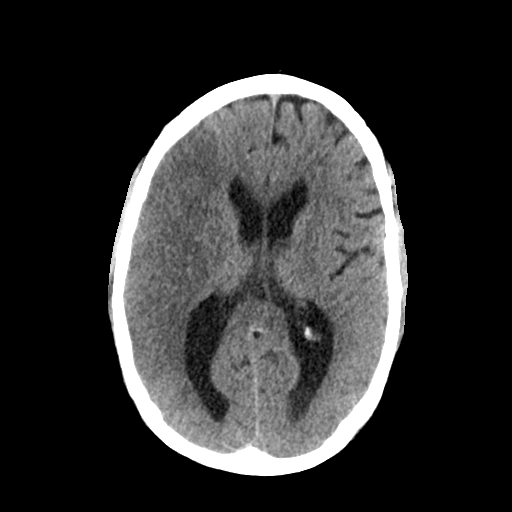
[im 22/35  brain]
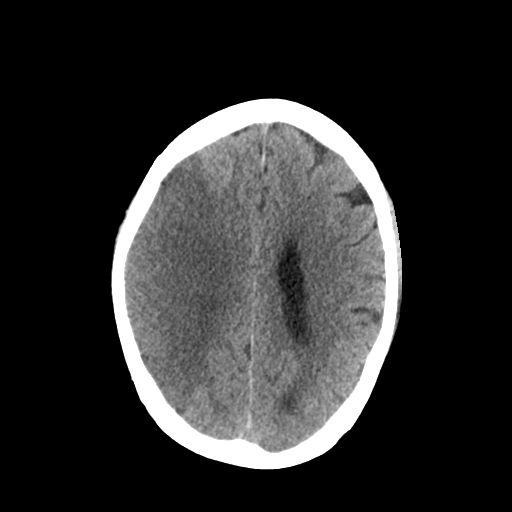
[im 22/35  bone]
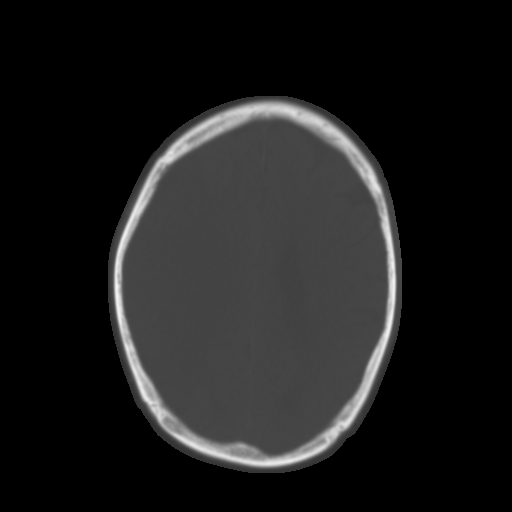
[im 26/35  brain]
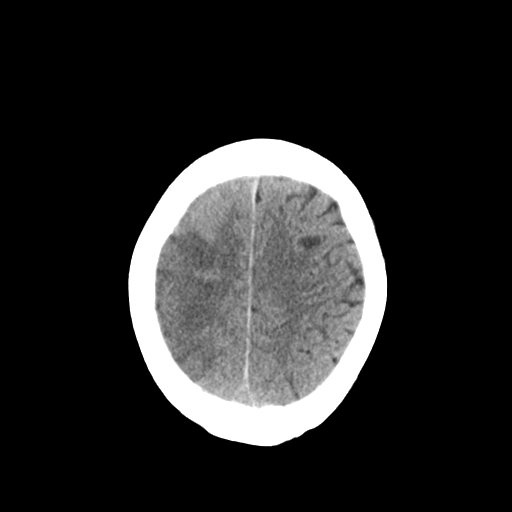
[im 30/35  brain]
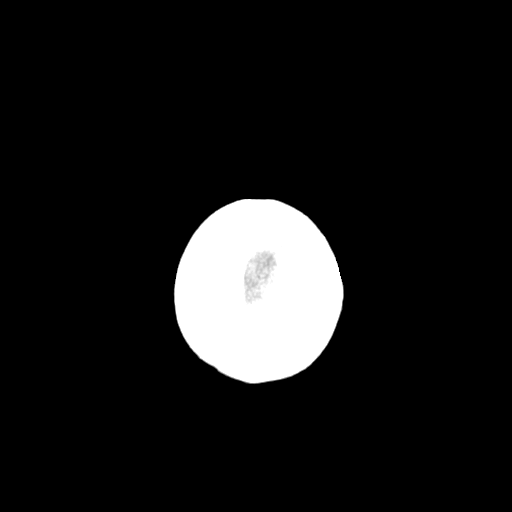

[Series 4: head bone · axial · 0.48mm/px · z∈[-472,-438]mm · 3 of 86 slices shown]
[im 9/86  bone]
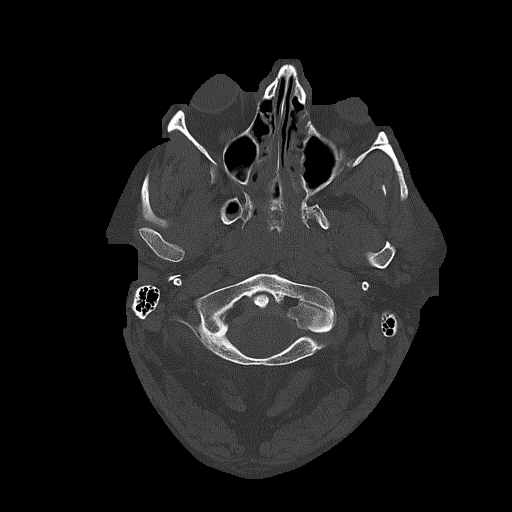
[im 18/86  bone]
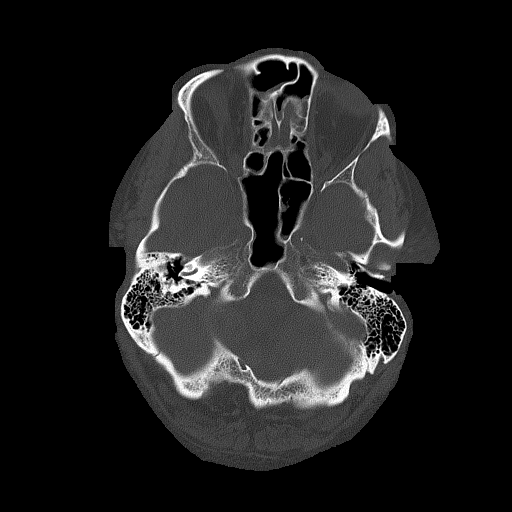
[im 26/86  bone]
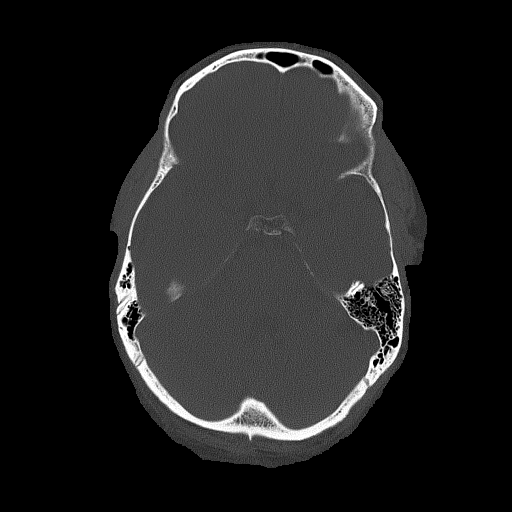

[Series 5: head without cor · coronal · non-contrast · 0.35mm/px · 3 of 71 slices shown]
[im 24/71  brain]
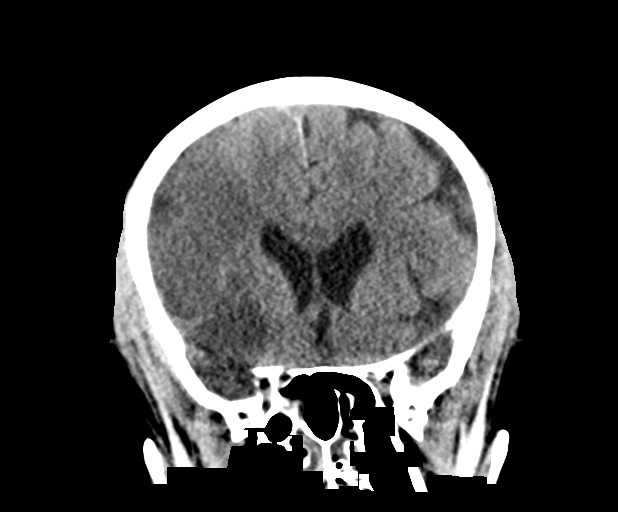
[im 32/71  brain]
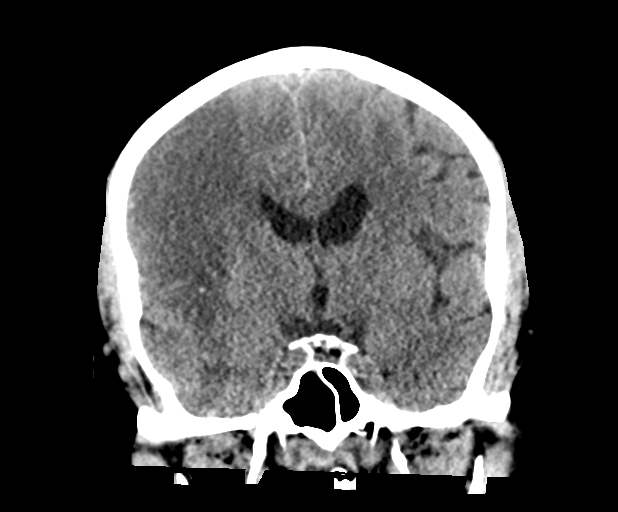
[im 39/71  brain]
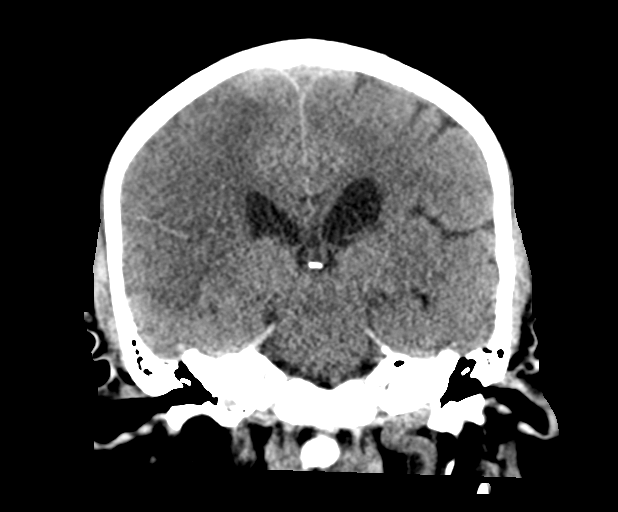

[Series 6: head without sag · sagittal · non-contrast · 0.35mm/px · 3 of 53 slices shown]
[im 18/53  brain]
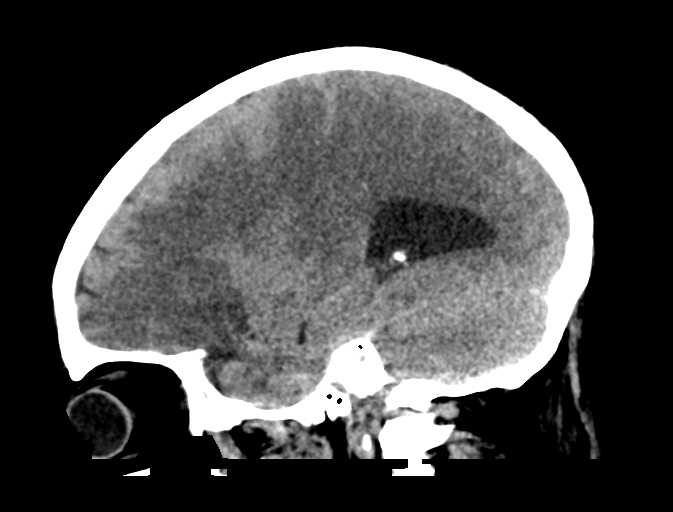
[im 27/53  brain]
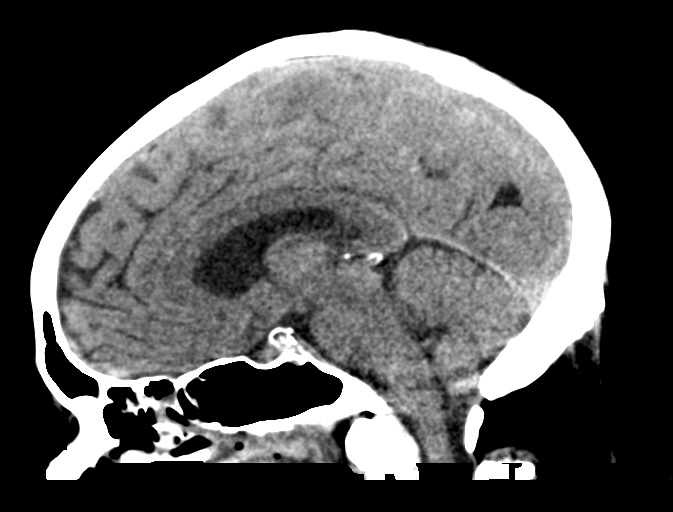
[im 35/53  brain]
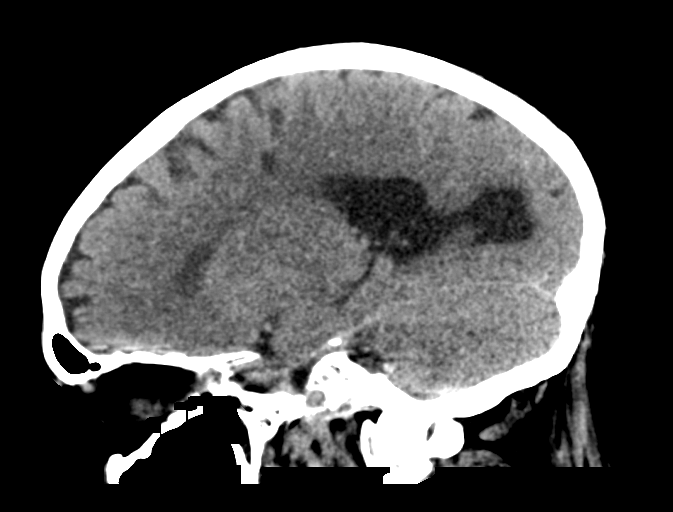

[16 of 47 positions shown; findings below may reference images not displayed]

FINDINGS: Brain: Confluent acute infarct in the right MCA territory which
matches diffusion imaging from prior brain MRI. Hemorrhagic
component at the anterior aspect of the right MCA infarct which has
a serpentine appearance on prior FLAIR imaging, likely petechial.
Smaller acute (by MRI) infarct in the left frontal lobe. No midline
shift, entrapment, or hydrocephalus. Right CP angle mass which is
very subtle in distinction to the prior MRI.

Vascular: No hyperdense vessel or unexpected calcification.

Skull: Normal. Negative for fracture or focal lesion.

Sinuses/Orbits: No acute finding.
IMPRESSION: Known acute infarcts most notably affecting the right MCA territory.
No midline shift or progressive petechial hemorrhage.
